# Patient Record
Sex: Female | Born: 1990 | Race: Black or African American | Hispanic: No | Smoking: Never smoker
Health system: Southern US, Community
[De-identification: ages and names within clinical notes are randomized; demographics above are authoritative.]

## PROBLEM LIST (undated history)

## (undated) ENCOUNTER — Inpatient Hospital Stay (HOSPITAL_COMMUNITY): Payer: Self-pay

## (undated) DIAGNOSIS — I10 Essential (primary) hypertension: Secondary | ICD-10-CM

## (undated) DIAGNOSIS — J45909 Unspecified asthma, uncomplicated: Secondary | ICD-10-CM

## (undated) DIAGNOSIS — E876 Hypokalemia: Secondary | ICD-10-CM

## (undated) DIAGNOSIS — F99 Mental disorder, not otherwise specified: Secondary | ICD-10-CM

## (undated) DIAGNOSIS — F3181 Bipolar II disorder: Secondary | ICD-10-CM

## (undated) DIAGNOSIS — E559 Vitamin D deficiency, unspecified: Secondary | ICD-10-CM

## (undated) HISTORY — DX: Vitamin D deficiency, unspecified: E55.9

## (undated) HISTORY — DX: Unspecified asthma, uncomplicated: J45.909

## (undated) HISTORY — PX: TONSILLECTOMY: SUR1361

## (undated) HISTORY — DX: Hypokalemia: E87.6

## (undated) HISTORY — DX: Mental disorder, not otherwise specified: F99

## (undated) HISTORY — DX: Bipolar II disorder: F31.81

---

## 2011-08-07 DIAGNOSIS — D649 Anemia, unspecified: Secondary | ICD-10-CM | POA: Insufficient documentation

## 2012-07-30 DIAGNOSIS — J45909 Unspecified asthma, uncomplicated: Secondary | ICD-10-CM | POA: Insufficient documentation

## 2013-02-12 ENCOUNTER — Emergency Department: Payer: Self-pay | Admitting: Internal Medicine

## 2013-02-12 LAB — URINALYSIS, COMPLETE
BLOOD: NEGATIVE
Bilirubin,UR: NEGATIVE
Glucose,UR: NEGATIVE mg/dL (ref 0–75)
Ketone: NEGATIVE
Leukocyte Esterase: NEGATIVE
Nitrite: NEGATIVE
PROTEIN: NEGATIVE
Ph: 6 (ref 4.5–8.0)
RBC,UR: 1 /HPF (ref 0–5)
Specific Gravity: 1.028 (ref 1.003–1.030)

## 2013-02-12 LAB — GC/CHLAMYDIA PROBE AMP

## 2013-02-12 LAB — WET PREP, GENITAL

## 2013-06-18 LAB — CYTOLOGY - PAP

## 2013-10-13 ENCOUNTER — Emergency Department: Payer: Self-pay | Admitting: Student

## 2013-10-13 LAB — URINALYSIS, COMPLETE
BLOOD: NEGATIVE
Bilirubin,UR: NEGATIVE
Glucose,UR: NEGATIVE mg/dL (ref 0–75)
KETONE: NEGATIVE
Nitrite: NEGATIVE
Ph: 7 (ref 4.5–8.0)
Protein: NEGATIVE
SPECIFIC GRAVITY: 1.024 (ref 1.003–1.030)
Squamous Epithelial: 11
WBC UR: 22 /HPF (ref 0–5)

## 2013-11-29 ENCOUNTER — Emergency Department: Payer: Self-pay | Admitting: Emergency Medicine

## 2013-11-29 LAB — CBC WITH DIFFERENTIAL/PLATELET
Basophil #: 0.1 10*3/uL (ref 0.0–0.1)
Basophil %: 0.7 %
EOS PCT: 0.4 %
Eosinophil #: 0 10*3/uL (ref 0.0–0.7)
HCT: 36.5 % (ref 35.0–47.0)
HGB: 11.9 g/dL — AB (ref 12.0–16.0)
Lymphocyte #: 2.9 10*3/uL (ref 1.0–3.6)
Lymphocyte %: 40.2 %
MCH: 28.1 pg (ref 26.0–34.0)
MCHC: 32.7 g/dL (ref 32.0–36.0)
MCV: 86 fL (ref 80–100)
Monocyte #: 0.6 x10 3/mm (ref 0.2–0.9)
Monocyte %: 8.3 %
NEUTROS PCT: 50.4 %
Neutrophil #: 3.7 10*3/uL (ref 1.4–6.5)
PLATELETS: 273 10*3/uL (ref 150–440)
RBC: 4.26 10*6/uL (ref 3.80–5.20)
RDW: 14 % (ref 11.5–14.5)
WBC: 7.3 10*3/uL (ref 3.6–11.0)

## 2013-11-29 LAB — COMPREHENSIVE METABOLIC PANEL
ALT: 18 U/L
Albumin: 3.9 g/dL (ref 3.4–5.0)
Alkaline Phosphatase: 89 U/L
Anion Gap: 10 (ref 7–16)
BUN: 8 mg/dL (ref 7–18)
Bilirubin,Total: 0.4 mg/dL (ref 0.2–1.0)
CALCIUM: 9 mg/dL (ref 8.5–10.1)
CHLORIDE: 106 mmol/L (ref 98–107)
Co2: 25 mmol/L (ref 21–32)
Creatinine: 0.59 mg/dL — ABNORMAL LOW (ref 0.60–1.30)
EGFR (Non-African Amer.): >60
Glucose: 71 mg/dL (ref 65–99)
OSMOLALITY: 278 (ref 275–301)
POTASSIUM: 4.1 mmol/L (ref 3.5–5.1)
SGOT(AST): 25 U/L (ref 15–37)
SODIUM: 141 mmol/L (ref 136–145)
TOTAL PROTEIN: 7.5 g/dL (ref 6.4–8.2)

## 2013-11-29 LAB — URINALYSIS, COMPLETE
BILIRUBIN, UR: NEGATIVE
Blood: NEGATIVE
GLUCOSE, UR: NEGATIVE mg/dL (ref 0–75)
Nitrite: NEGATIVE
PH: 5 (ref 4.5–8.0)
Protein: 30
RBC,UR: 10 /HPF (ref 0–5)
SPECIFIC GRAVITY: 1.02 (ref 1.003–1.030)
Squamous Epithelial: 100
WBC UR: 17 /HPF (ref 0–5)

## 2013-11-29 LAB — HCG, QUANTITATIVE, PREGNANCY: Beta Hcg, Quant.: 1153 m[IU]/mL — ABNORMAL HIGH

## 2013-11-29 LAB — LIPASE, BLOOD: LIPASE: 105 U/L (ref 73–393)

## 2013-12-22 LAB — OB RESULTS CONSOLE GC/CHLAMYDIA
CHLAMYDIA, DNA PROBE: NEGATIVE
GC PROBE AMP, GENITAL: NEGATIVE

## 2013-12-22 LAB — OB RESULTS CONSOLE ANTIBODY SCREEN: ANTIBODY SCREEN: NEGATIVE

## 2013-12-22 LAB — OB RESULTS CONSOLE ABO/RH: RH TYPE: POSITIVE

## 2013-12-22 LAB — OB RESULTS CONSOLE HGB/HCT, BLOOD
HCT: 36 %
HEMOGLOBIN: 12.1 g/dL

## 2013-12-22 LAB — OB RESULTS CONSOLE RUBELLA ANTIBODY, IGM: RUBELLA: IMMUNE

## 2013-12-22 LAB — OB RESULTS CONSOLE RPR: RPR: NONREACTIVE

## 2013-12-22 LAB — OB RESULTS CONSOLE VARICELLA ZOSTER ANTIBODY, IGG: Varicella: IMMUNE

## 2013-12-22 LAB — OB RESULTS CONSOLE HEPATITIS B SURFACE ANTIGEN: HEP B S AG: NEGATIVE

## 2013-12-22 LAB — OB RESULTS CONSOLE HIV ANTIBODY (ROUTINE TESTING): HIV: NONREACTIVE

## 2013-12-22 LAB — OB RESULTS CONSOLE PLATELET COUNT: PLATELETS: 344 10*3/uL

## 2014-02-06 NOTE — L&D Delivery Note (Signed)
Patient is 24 y.o. [id number] [redacted]w[redacted]d admitted for SOL.  Delivery Note At 11:41 AM a viable female was delivered via Vaginal, Spontaneous Delivery (Presentation: Left Occiput Anterior).  APGAR: 9, 9; weight 7 lb 6.7 oz (3365 g). Placenta status: Intact, Spontaneous.  Cord: 3 vessels with the following complications: None.  Cord pH: none.  Anesthesia: None  Episiotomy: None Lacerations: None Est. Blood Loss (mL): 130  Mom to postpartum.  Baby to Couplet care / Skin to Skin.   Caryl Ada, DO 07/29/2014, 12:01 PM PGY-1, [facility]

## 2014-03-16 ENCOUNTER — Emergency Department: Payer: Self-pay | Admitting: Emergency Medicine

## 2014-03-16 LAB — URINALYSIS, COMPLETE
BILIRUBIN, UR: NEGATIVE
Bacteria: NONE SEEN
Blood: NEGATIVE
Glucose,UR: NEGATIVE mg/dL (ref 0–75)
KETONE: NEGATIVE
LEUKOCYTE ESTERASE: NEGATIVE
Nitrite: NEGATIVE
Ph: 8 (ref 4.5–8.0)
Protein: NEGATIVE
RBC,UR: 1 /HPF (ref 0–5)
Specific Gravity: 1.015 (ref 1.003–1.030)
Squamous Epithelial: 5
WBC UR: 3 /HPF (ref 0–5)

## 2014-03-16 LAB — CBC WITH DIFFERENTIAL/PLATELET
Basophil #: 0 10*3/uL (ref 0.0–0.1)
Basophil %: 0.6 %
Eosinophil #: 0 10*3/uL (ref 0.0–0.7)
Eosinophil %: 0.5 %
HCT: 33 % — ABNORMAL LOW (ref 35.0–47.0)
HGB: 10.9 g/dL — AB (ref 12.0–16.0)
Lymphocyte #: 2 10*3/uL (ref 1.0–3.6)
Lymphocyte %: 30.1 %
MCH: 28.4 pg (ref 26.0–34.0)
MCHC: 33 g/dL (ref 32.0–36.0)
MCV: 86 fL (ref 80–100)
MONO ABS: 0.6 x10 3/mm (ref 0.2–0.9)
Monocyte %: 8.5 %
NEUTROS PCT: 60.3 %
Neutrophil #: 4 10*3/uL (ref 1.4–6.5)
PLATELETS: 262 10*3/uL (ref 150–440)
RBC: 3.84 10*6/uL (ref 3.80–5.20)
RDW: 13.6 % (ref 11.5–14.5)
WBC: 6.6 10*3/uL (ref 3.6–11.0)

## 2014-03-16 LAB — COMPREHENSIVE METABOLIC PANEL
ALK PHOS: 103 U/L (ref 46–116)
Albumin: 2.7 g/dL — ABNORMAL LOW (ref 3.4–5.0)
Anion Gap: 9 (ref 7–16)
BILIRUBIN TOTAL: 0.2 mg/dL (ref 0.2–1.0)
BUN: 7 mg/dL (ref 7–18)
CHLORIDE: 104 mmol/L (ref 98–107)
CO2: 23 mmol/L (ref 21–32)
CREATININE: 0.54 mg/dL — AB (ref 0.60–1.30)
Calcium, Total: 8.9 mg/dL (ref 8.5–10.1)
EGFR (African American): >60
Glucose: 85 mg/dL (ref 65–99)
OSMOLALITY: 269 (ref 275–301)
Potassium: 3.9 mmol/L (ref 3.5–5.1)
SGOT(AST): 9 U/L — ABNORMAL LOW (ref 15–37)
SGPT (ALT): 14 U/L (ref 14–63)
Sodium: 136 mmol/L (ref 136–145)
Total Protein: 6.9 g/dL (ref 6.4–8.2)

## 2014-03-16 LAB — LIPASE, BLOOD: LIPASE: 96 U/L (ref 73–393)

## 2014-03-16 LAB — HCG, QUANTITATIVE, PREGNANCY: Beta Hcg, Quant.: 34575 m[IU]/mL — ABNORMAL HIGH

## 2014-04-08 ENCOUNTER — Encounter: Payer: Self-pay | Admitting: *Deleted

## 2014-04-29 ENCOUNTER — Encounter: Payer: Self-pay | Admitting: Physician Assistant

## 2014-04-29 ENCOUNTER — Ambulatory Visit (INDEPENDENT_AMBULATORY_CARE_PROVIDER_SITE_OTHER): Payer: BLUE CROSS/BLUE SHIELD | Admitting: Physician Assistant

## 2014-04-29 VITALS — BP 108/72 | HR 85 | Ht 61.0 in | Wt 159.1 lb

## 2014-04-29 DIAGNOSIS — O9989 Other specified diseases and conditions complicating pregnancy, childbirth and the puerperium: Secondary | ICD-10-CM

## 2014-04-29 DIAGNOSIS — J45909 Unspecified asthma, uncomplicated: Secondary | ICD-10-CM | POA: Insufficient documentation

## 2014-04-29 DIAGNOSIS — O99519 Diseases of the respiratory system complicating pregnancy, unspecified trimester: Secondary | ICD-10-CM

## 2014-04-29 DIAGNOSIS — Z3482 Encounter for supervision of other normal pregnancy, second trimester: Secondary | ICD-10-CM

## 2014-04-29 DIAGNOSIS — Z3492 Encounter for supervision of normal pregnancy, unspecified, second trimester: Secondary | ICD-10-CM

## 2014-04-29 DIAGNOSIS — Z3493 Encounter for supervision of normal pregnancy, unspecified, third trimester: Secondary | ICD-10-CM | POA: Insufficient documentation

## 2014-04-29 LAB — POCT URINALYSIS DIP (DEVICE)
Bilirubin Urine: NEGATIVE
Glucose, UA: NEGATIVE mg/dL
Hgb urine dipstick: NEGATIVE
KETONES UR: NEGATIVE mg/dL
LEUKOCYTES UA: NEGATIVE
Nitrite: NEGATIVE
Protein, ur: NEGATIVE mg/dL
Specific Gravity, Urine: 1.02 (ref 1.005–1.030)
Urobilinogen, UA: 1 mg/dL (ref 0.0–1.0)
pH: 7.5 (ref 5.0–8.0)

## 2014-04-29 LAB — CBC
HEMATOCRIT: 30.3 % — AB (ref 36.0–46.0)
HEMOGLOBIN: 10.2 g/dL — AB (ref 12.0–15.0)
MCH: 28.3 pg (ref 26.0–34.0)
MCHC: 33.7 g/dL (ref 30.0–36.0)
MCV: 84.2 fL (ref 78.0–100.0)
MPV: 10.1 fL (ref 8.6–12.4)
Platelets: 268 10*3/uL (ref 150–400)
RBC: 3.6 MIL/uL — ABNORMAL LOW (ref 3.87–5.11)
RDW: 13.2 % (ref 11.5–15.5)
WBC: 6.5 10*3/uL (ref 4.0–10.5)

## 2014-04-29 MED ORDER — TETANUS-DIPHTH-ACELL PERTUSSIS 5-2.5-18.5 LF-MCG/0.5 IM SUSP: 0.5000 mL | Freq: Once | INTRAMUSCULAR | Status: DC

## 2014-04-29 NOTE — Patient Instructions (Signed)
Second Trimester of Pregnancy The second trimester is from week 13 through week 28, months 4 through 6. The second trimester is often a time when you feel your best. Your body has also adjusted to being pregnant, and you begin to feel better physically. Usually, morning sickness has lessened or quit completely, you may have more energy, and you may have an increase in appetite. The second trimester is also a time when the fetus is growing rapidly. At the end of the sixth month, the fetus is about 9 inches long and weighs about 1 pounds. You will likely begin to feel the baby move (quickening) between 18 and 20 weeks of the pregnancy. BODY CHANGES Your body goes through many changes during pregnancy. The changes vary from woman to woman.   Your weight will continue to increase. You will notice your lower abdomen bulging out.  You may begin to get stretch marks on your hips, abdomen, and breasts.  You may develop headaches that can be relieved by medicines approved by your health care provider.  You may urinate more often because the fetus is pressing on your bladder.  You may develop or continue to have heartburn as a result of your pregnancy.  You may develop constipation because certain hormones are causing the muscles that push waste through your intestines to slow down.  You may develop hemorrhoids or swollen, bulging veins (varicose veins).  You may have back pain because of the weight gain and pregnancy hormones relaxing your joints between the bones in your pelvis and as a result of a shift in weight and the muscles that support your balance.  Your breasts will continue to grow and be tender.  Your gums may bleed and may be sensitive to brushing and flossing.  Dark spots or blotches (chloasma, mask of pregnancy) may develop on your face. This will likely fade after the baby is born.  A dark line from your belly button to the pubic area (linea nigra) may appear. This will likely fade  after the baby is born.  You may have changes in your hair. These can include thickening of your hair, rapid growth, and changes in texture. Some women also have hair loss during or after pregnancy, or hair that feels dry or thin. Your hair will most likely return to normal after your baby is born. WHAT TO EXPECT AT YOUR PRENATAL VISITS During a routine prenatal visit:  You will be weighed to make sure you and the fetus are growing normally.  Your blood pressure will be taken.  Your abdomen will be measured to track your baby's growth.  The fetal heartbeat will be listened to.  Any test results from the previous visit will be discussed. Your health care provider may ask you:  How you are feeling.  If you are feeling the baby move.  If you have had any abnormal symptoms, such as leaking fluid, bleeding, severe headaches, or abdominal cramping.  If you have any questions. Other tests that may be performed during your second trimester include:  Blood tests that check for:  Low iron levels (anemia).  Gestational diabetes (between 24 and 28 weeks).  Rh antibodies.  Urine tests to check for infections, diabetes, or protein in the urine.  An ultrasound to confirm the proper growth and development of the baby.  An amniocentesis to check for possible genetic problems.  Fetal screens for spina bifida and Down syndrome. HOME CARE INSTRUCTIONS   Avoid all smoking, herbs, alcohol, and unprescribed   drugs. These chemicals affect the formation and growth of the baby.  Follow your health care provider's instructions regarding medicine use. There are medicines that are either safe or unsafe to take during pregnancy.  Exercise only as directed by your health care provider. Experiencing uterine cramps is a good sign to stop exercising.  Continue to eat regular, healthy meals.  Wear a good support bra for breast tenderness.  Do not use hot tubs, steam rooms, or saunas.  Wear your  seat belt at all times when driving.  Avoid raw meat, uncooked cheese, cat litter boxes, and soil used by cats. These carry germs that can cause birth defects in the baby.  Take your prenatal vitamins.  Try taking a stool softener (if your health care provider approves) if you develop constipation. Eat more high-fiber foods, such as fresh vegetables or fruit and whole grains. Drink plenty of fluids to keep your urine clear or pale yellow.  Take warm sitz baths to soothe any pain or discomfort caused by hemorrhoids. Use hemorrhoid cream if your health care provider approves.  If you develop varicose veins, wear support hose. Elevate your feet for 15 minutes, 3-4 times a day. Limit salt in your diet.  Avoid heavy lifting, wear low heel shoes, and practice good posture.  Rest with your legs elevated if you have leg cramps or low back pain.  Visit your dentist if you have not gone yet during your pregnancy. Use a soft toothbrush to brush your teeth and be gentle when you floss.  A sexual relationship may be continued unless your health care provider directs you otherwise.  Continue to go to all your prenatal visits as directed by your health care provider. SEEK MEDICAL CARE IF:   You have dizziness.  You have mild pelvic cramps, pelvic pressure, or nagging pain in the abdominal area.  You have persistent nausea, vomiting, or diarrhea.  You have a bad smelling vaginal discharge.  You have pain with urination. SEEK IMMEDIATE MEDICAL CARE IF:   You have a fever.  You are leaking fluid from your vagina.  You have spotting or bleeding from your vagina.  You have severe abdominal cramping or pain.  You have rapid weight gain or loss.  You have shortness of breath with chest pain.  You notice sudden or extreme swelling of your face, hands, ankles, feet, or legs.  You have not felt your baby move in over an hour.  You have severe headaches that do not go away with  medicine.  You have vision changes. Document Released: 01/17/2001 Document Revised: 01/28/2013 Document Reviewed: 03/26/2012 ExitCare Patient Information 2015 [employer]. This information is not intended to replace advice given to you by your health care provider. Make sure you discuss any questions you have with your health care provider.  

## 2014-04-29 NOTE — Progress Notes (Signed)
  Subjective:    Carrie [name] is a G3P1011 7152w2d being seen today for her first obstetrical visit.  Her obstetrical history is significant [facility]   Patient does intend to breast feed. Pregnancy history fully reviewed.  Patient reports no complaints.  Filed Vitals:   04/29/14 0916 04/29/14 0918  BP: 108/72   Pulse: 85   Height:  5\' 1"  (1.549 m)  Weight: 159 lb 1.6 oz (72.167 kg)     HISTORY: OB History  Gravida Para Term Preterm AB SAB TAB Ectopic Multiple Living  3 1 1  1  1   1     # Outcome Date GA Lbr Len/2nd Weight Sex Delivery Anes PTL Lv  3 Current           2 TAB 02/07/11          1 Term 09/06/07 4448w0d  7 lb 11 oz (3.487 kg) M Vag-Spont  N Y     Past Medical History  Diagnosis Date  . Asthma    Past Surgical History  Procedure Laterality Date  . Tonsillectomy     History reviewed. No pertinent family history.   Exam    Uterus:   gravid  :                               System:     Skin: normal coloration and turgor, no rashes    Neurologic: oriented, normal mood, grossly non-focal   Extremities: normal strength, tone, and muscle mass   HEENT extra ocular movement intact   Mouth/Teeth mucous membranes moist, pharynx normal without lesions and dental hygiene good   Neck supple and no masses   Cardiovascular: regular rate and rhythm   Respiratory:  appears well, vitals normal, no respiratory distress, acyanotic, normal RR, ear and throat exam is normal, neck free of mass or lymphadenopathy, chest clear, no wheezing, crepitations, rhonchi, normal symmetric air entry   Abdomen: soft, non-tender; bowel sounds normal; no masses,  no organomegaly          Assessment:    Pregnancy: [id number] Patient Active Problem List   Diagnosis Date Noted  . Encounter for supervision of other normal pregnancy in second trimester 04/29/2014  . Asthma affecting pregnancy, antepartum 04/29/2014        Plan:     Initial labs  drawn. Prenatal vitamins. Problem list reviewed and updated. Genetic Screening discussed Quad Screen: ordered.  Ultrasound discussed; fetal survey: requested.  Follow up in 2 weeks. 90% of 45 min visit spent on counseling and coordination of care.  Pt has albuterol inhaler already.   Carrie [name], Karen E 04/29/2014

## 2014-04-30 LAB — PRESCRIPTION MONITORING PROFILE (19 PANEL)
Amphetamine/Meth: NEGATIVE ng/mL
BENZODIAZEPINE SCREEN, URINE: NEGATIVE ng/mL
BUPRENORPHINE, URINE: NEGATIVE ng/mL
Barbiturate Screen, Urine: NEGATIVE ng/mL
CANNABINOID SCRN UR: NEGATIVE ng/mL
COCAINE METABOLITES: NEGATIVE ng/mL
Carisoprodol, Urine: NEGATIVE ng/mL
Creatinine, Urine: 104.93 mg/dL (ref >20.0)
FENTANYL URINE: NEGATIVE ng/mL
MDMA URINE: NEGATIVE ng/mL
METHAQUALONE SCREEN (URINE): NEGATIVE ng/mL
Meperidine, Ur: NEGATIVE ng/mL
Methadone Screen, Urine: NEGATIVE ng/mL
Nitrites, Initial: NEGATIVE ug/mL
OPIATE SCREEN, URINE: NEGATIVE ng/mL
Oxycodone Screen, Ur: NEGATIVE ng/mL
PHENCYCLIDINE, UR: NEGATIVE ng/mL
Propoxyphene: NEGATIVE ng/mL
Tapentadol, urine: NEGATIVE ng/mL
Tramadol Scrn, Ur: NEGATIVE ng/mL
Zolpidem, Urine: NEGATIVE ng/mL
pH, Initial: 7.6 pH (ref 4.5–8.9)

## 2014-04-30 LAB — CULTURE, OB URINE
COLONY COUNT: NO GROWTH
Organism ID, Bacteria: NO GROWTH

## 2014-04-30 LAB — HIV ANTIBODY (ROUTINE TESTING W REFLEX): HIV: NONREACTIVE

## 2014-04-30 LAB — GLUCOSE TOLERANCE, 1 HOUR (50G) W/O FASTING: GLUCOSE 1 HOUR GTT: 94 mg/dL (ref 70–140)

## 2014-04-30 LAB — RPR

## 2014-05-11 ENCOUNTER — Other Ambulatory Visit: Payer: Self-pay | Admitting: Physician Assistant

## 2014-05-11 ENCOUNTER — Ambulatory Visit (HOSPITAL_COMMUNITY)
Admission: RE | Admit: 2014-05-11 | Discharge: 2014-05-11 | Disposition: A | Discharge status: Home / Self Care | Payer: BLUE CROSS/BLUE SHIELD | Source: Ambulatory Visit | Attending: Physician Assistant | Admitting: Physician Assistant

## 2014-05-11 ENCOUNTER — Ambulatory Visit (INDEPENDENT_AMBULATORY_CARE_PROVIDER_SITE_OTHER): Payer: BLUE CROSS/BLUE SHIELD | Admitting: Family Medicine

## 2014-05-11 VITALS — BP 103/61 | HR 84 | Temp 98.0°F | Wt 160.2 lb

## 2014-05-11 DIAGNOSIS — O409XX0 Polyhydramnios, unspecified trimester, not applicable or unspecified: Secondary | ICD-10-CM | POA: Insufficient documentation

## 2014-05-11 DIAGNOSIS — Z3A28 28 weeks gestation of pregnancy: Secondary | ICD-10-CM | POA: Diagnosis not present

## 2014-05-11 DIAGNOSIS — Z3492 Encounter for supervision of normal pregnancy, unspecified, second trimester: Secondary | ICD-10-CM

## 2014-05-11 DIAGNOSIS — O403XX0 Polyhydramnios, third trimester, not applicable or unspecified: Secondary | ICD-10-CM | POA: Insufficient documentation

## 2014-05-11 DIAGNOSIS — Z3482 Encounter for supervision of other normal pregnancy, second trimester: Secondary | ICD-10-CM

## 2014-05-11 DIAGNOSIS — Z3689 Encounter for other specified antenatal screening: Secondary | ICD-10-CM | POA: Insufficient documentation

## 2014-05-11 DIAGNOSIS — Z36 Encounter for antenatal screening of mother: Secondary | ICD-10-CM | POA: Diagnosis not present

## 2014-05-11 NOTE — Patient Instructions (Signed)
Third Trimester of Pregnancy The third trimester is from week 29 through week 42, months 7 through 9. The third trimester is a time when the fetus is growing rapidly. At the end of the ninth month, the fetus is about 20 inches in length and weighs 6-10 pounds.  BODY CHANGES Your body goes through many changes during pregnancy. The changes vary from woman to woman.   Your weight will continue to increase. You can expect to gain 25-35 pounds (11-16 kg) by the end of the pregnancy.  You may begin to get stretch marks on your hips, abdomen, and breasts.  You may urinate more often because the fetus is moving lower into your pelvis and pressing on your bladder.  You may develop or continue to have heartburn as a result of your pregnancy.  You may develop constipation because certain hormones are causing the muscles that push waste through your intestines to slow down.  You may develop hemorrhoids or swollen, bulging veins (varicose veins).  You may have pelvic pain because of the weight gain and pregnancy hormones relaxing your joints between the bones in your pelvis. Backaches may result from overexertion of the muscles supporting your posture.  You may have changes in your hair. These can include thickening of your hair, rapid growth, and changes in texture. Some women also have hair loss during or after pregnancy, or hair that feels dry or thin. Your hair will most likely return to normal after your baby is born.  Your breasts will continue to grow and be tender. A yellow discharge may leak from your breasts called colostrum.  Your belly button may stick out.  You may feel short of breath because of your expanding uterus.  You may notice the fetus "dropping," or moving lower in your abdomen.  You may have a bloody mucus discharge. This usually occurs a few days to a week before labor begins.  Your cervix becomes thin and soft (effaced) near your due date. WHAT TO EXPECT AT YOUR PRENATAL  EXAMS  You will have prenatal exams every 2 weeks until week 36. Then, you will have weekly prenatal exams. During a routine prenatal visit:  You will be weighed to make sure you and the fetus are growing normally.  Your blood pressure is taken.  Your abdomen will be measured to track your baby's growth.  The fetal heartbeat will be listened to.  Any test results from the previous visit will be discussed.  You may have a cervical check near your due date to see if you have effaced. At around 36 weeks, your caregiver will check your cervix. At the same time, your caregiver will also perform a test on the secretions of the vaginal tissue. This test is to determine if a type of bacteria, Group B streptococcus, is present. Your caregiver will explain this further. Your caregiver may ask you:  What your birth plan is.  How you are feeling.  If you are feeling the baby move.  If you have had any abnormal symptoms, such as leaking fluid, bleeding, severe headaches, or abdominal cramping.  If you have any questions. Other tests or screenings that may be performed during your third trimester include:  Blood tests that check for low iron levels (anemia).  Fetal testing to check the health, activity level, and growth of the fetus. Testing is done if you have certain medical conditions or if there are problems during the pregnancy. FALSE LABOR You may feel small, irregular contractions that   eventually go away. These are called Braxton Hicks contractions, or false labor. Contractions may last for hours, days, or even weeks before true labor sets in. If contractions come at regular intervals, intensify, or become painful, it is best to be seen by your caregiver.  SIGNS OF LABOR   Menstrual-like cramps.  Contractions that are 5 minutes apart or less.  Contractions that start on the top of the uterus and spread down to the lower abdomen and back.  A sense of increased pelvic pressure or back  pain.  A watery or bloody mucus discharge that comes from the vagina. If you have any of these signs before the 37th week of pregnancy, call your caregiver right away. You need to go to the hospital to get checked immediately. HOME CARE INSTRUCTIONS   Avoid all smoking, herbs, alcohol, and unprescribed drugs. These chemicals affect the formation and growth of the baby.  Follow your caregiver's instructions regarding medicine use. There are medicines that are either safe or unsafe to take during pregnancy.  Exercise only as directed by your caregiver. Experiencing uterine cramps is a good sign to stop exercising.  Continue to eat regular, healthy meals.  Wear a good support bra for breast tenderness.  Do not use hot tubs, steam rooms, or saunas.  Wear your seat belt at all times when driving.  Avoid raw meat, uncooked cheese, cat litter boxes, and soil used by cats. These carry germs that can cause birth defects in the baby.  Take your prenatal vitamins.  Try taking a stool softener (if your caregiver approves) if you develop constipation. Eat more high-fiber foods, such as fresh vegetables or fruit and whole grains. Drink plenty of fluids to keep your urine clear or pale yellow.  Take warm sitz baths to soothe any pain or discomfort caused by hemorrhoids. Use hemorrhoid cream if your caregiver approves.  If you develop varicose veins, wear support hose. Elevate your feet for 15 minutes, 3-4 times a day. Limit salt in your diet.  Avoid heavy lifting, wear low heal shoes, and practice good posture.  Rest a lot with your legs elevated if you have leg cramps or low back pain.  Visit your dentist if you have not gone during your pregnancy. Use a soft toothbrush to brush your teeth and be gentle when you floss.  A sexual relationship may be continued unless your caregiver directs you otherwise.  Do not travel far distances unless it is absolutely necessary and only with the approval  of your caregiver.  Take prenatal classes to understand, practice, and ask questions about the labor and delivery.  Make a trial run to the hospital.  Pack your hospital bag.  Prepare the baby's nursery.  Continue to go to all your prenatal visits as directed by your caregiver. SEEK MEDICAL CARE IF:  You are unsure if you are in labor or if your water has broken.  You have dizziness.  You have mild pelvic cramps, pelvic pressure, or nagging pain in your abdominal area.  You have persistent nausea, vomiting, or diarrhea.  You have a bad smelling vaginal discharge.  You have pain with urination. SEEK IMMEDIATE MEDICAL CARE IF:   You have a fever.  You are leaking fluid from your vagina.  You have spotting or bleeding from your vagina.  You have severe abdominal cramping or pain.  You have rapid weight loss or gain.  You have shortness of breath with chest pain.  You notice sudden or extreme swelling   of your face, hands, ankles, feet, or legs.  You have not felt your baby move in over an hour.  You have severe headaches that do not go away with medicine.  You have vision changes. Document Released: 01/17/2001 Document Revised: 01/28/2013 Document Reviewed: 03/26/2012 Exit[employer] Patient Information 2015 [employer]. This information is not intended to replace advice given to you by your health care provider. Make sure you discuss any questions you have with your health care provider.  

## 2014-05-11 NOTE — Progress Notes (Signed)
Doing well Reports excellent FM TDaP has been given 28 wk labs WNL

## 2014-05-11 NOTE — Progress Notes (Signed)
States unable to take prenatal vitamins because of size-needs prescription for chewables. C/o lower back pain. C/o watery discharge that she has been having all of pregnancy, not changed.

## 2014-05-25 ENCOUNTER — Telehealth: Payer: Self-pay | Admitting: *Deleted

## 2014-05-25 DIAGNOSIS — O403XX1 Polyhydramnios, third trimester, fetus 1: Secondary | ICD-10-CM

## 2014-05-25 NOTE — Telephone Encounter (Signed)
BPP scheduled 1) 05/28/14 @ 3:45pm   2)  4/28 @ 0918 after OB appt.   Attempted to contact patient, no answer, left message for patient to contact the clinic for appointments.  Pt needs to made aware of diagnosis of polyhydramnios and scheduled BPP's.

## 2014-05-25 NOTE — Telephone Encounter (Signed)
Patient called and left message stating she is returning our call. Called patient and informed her of results and recommendations. Informed patient of appts. Patient verbalized understanding to all and asked why this might have happened. Told patient there isn't a particular reason we can tell her why but sometimes this does happen in otherwise normal pregnancies. Patient verbalized understanding and had no other questions

## 2014-05-25 NOTE — Telephone Encounter (Signed)
-----   Message from Bertram DenverKaren E Teague Clark, PA-C sent at 05/22/2014  6:47 PM EDT ----- Per u/s on 4/4 - pt has polyhydramnios.  Will need 1 hour gtt if not already performed.  SHould have weekly BPP until 32 weeks, then twice weekly testing.   Needs u/s for growth 4 weeks after this u/s.

## 2014-05-28 ENCOUNTER — Ambulatory Visit (HOSPITAL_COMMUNITY)
Admission: RE | Admit: 2014-05-28 | Discharge: 2014-05-28 | Disposition: A | Discharge status: Home / Self Care | Payer: BLUE CROSS/BLUE SHIELD | Source: Ambulatory Visit | Attending: Physician Assistant | Admitting: Physician Assistant

## 2014-05-28 DIAGNOSIS — Z3A30 30 weeks gestation of pregnancy: Secondary | ICD-10-CM | POA: Insufficient documentation

## 2014-05-28 DIAGNOSIS — O403XX0 Polyhydramnios, third trimester, not applicable or unspecified: Secondary | ICD-10-CM | POA: Insufficient documentation

## 2014-05-28 DIAGNOSIS — O403XX1 Polyhydramnios, third trimester, fetus 1: Secondary | ICD-10-CM

## 2014-06-04 ENCOUNTER — Encounter: Payer: Self-pay | Admitting: Obstetrics and Gynecology

## 2014-06-04 ENCOUNTER — Ambulatory Visit (INDEPENDENT_AMBULATORY_CARE_PROVIDER_SITE_OTHER): Payer: BLUE CROSS/BLUE SHIELD | Admitting: Obstetrics and Gynecology

## 2014-06-04 ENCOUNTER — Ambulatory Visit (HOSPITAL_COMMUNITY)
Admission: RE | Admit: 2014-06-04 | Discharge: 2014-06-04 | Disposition: A | Discharge status: Home / Self Care | Payer: BLUE CROSS/BLUE SHIELD | Source: Ambulatory Visit | Attending: Physician Assistant | Admitting: Physician Assistant

## 2014-06-04 VITALS — BP 119/71 | HR 94 | Wt 163.1 lb

## 2014-06-04 DIAGNOSIS — Z3482 Encounter for supervision of other normal pregnancy, second trimester: Secondary | ICD-10-CM

## 2014-06-04 DIAGNOSIS — J45909 Unspecified asthma, uncomplicated: Secondary | ICD-10-CM

## 2014-06-04 DIAGNOSIS — Z3A31 31 weeks gestation of pregnancy: Secondary | ICD-10-CM | POA: Insufficient documentation

## 2014-06-04 DIAGNOSIS — O403XX0 Polyhydramnios, third trimester, not applicable or unspecified: Secondary | ICD-10-CM | POA: Diagnosis not present

## 2014-06-04 DIAGNOSIS — O99519 Diseases of the respiratory system complicating pregnancy, unspecified trimester: Secondary | ICD-10-CM

## 2014-06-04 DIAGNOSIS — O9989 Other specified diseases and conditions complicating pregnancy, childbirth and the puerperium: Secondary | ICD-10-CM

## 2014-06-04 DIAGNOSIS — O403XX1 Polyhydramnios, third trimester, fetus 1: Secondary | ICD-10-CM

## 2014-06-04 LAB — POCT URINALYSIS DIP (DEVICE)
Bilirubin Urine: NEGATIVE
Glucose, UA: NEGATIVE mg/dL
Hgb urine dipstick: NEGATIVE
KETONES UR: NEGATIVE mg/dL
NITRITE: NEGATIVE
PROTEIN: NEGATIVE mg/dL
Specific Gravity, Urine: 1.02 (ref 1.005–1.030)
Urobilinogen, UA: 4 mg/dL — ABNORMAL HIGH (ref 0.0–1.0)
pH: 7 (ref 5.0–8.0)

## 2014-06-04 NOTE — Progress Notes (Signed)
Small leuks on Udip 

## 2014-06-04 NOTE — Progress Notes (Signed)
Patient is doing well without any significant complaints. She reports the presence of a thin discharge non pruritic and without odor. Wet prep collected F/U ultrasound scheduled for today

## 2014-06-04 NOTE — Progress Notes (Signed)
Pt reports thin discharge that is watery at times.

## 2014-06-05 ENCOUNTER — Telehealth: Payer: Self-pay | Admitting: General Practice

## 2014-06-05 LAB — WET PREP, GENITAL
Trich, Wet Prep: NONE SEEN
YEAST WET PREP: NONE SEEN

## 2014-06-05 MED ORDER — METRONIDAZOLE 500 MG PO TABS: 500.0000 mg | ORAL_TABLET | Freq: Two times a day (BID) | ORAL | Status: DC

## 2014-06-05 NOTE — Telephone Encounter (Signed)
-----   Message from Catalina AntiguaPeggy Constant, MD sent at 06/05/2014  8:42 AM EDT ----- Please inform patient of positive BV. Flagyl e-prescribed  Forgot to tag patient name before  Thanks  Carrie [name]

## 2014-06-05 NOTE — Telephone Encounter (Signed)
Patient called and left message stating her doctor called a Rx in for her but she doesn't know why. Called patient and informed her of BV and medication at pharmacy. Patient verbalized understanding and asked about starting testing for NST. Told patient I will transfer her to our nurse that usually does the NSTs. Patient verbalized understanding and had no other questions. Called transferred to Diane

## 2014-06-05 NOTE — Addendum Note (Signed)
Addended by: Catalina AntiguaONSTANT, Kayl Stogdill on: 06/05/2014 08:42 AM   Modules accepted: Orders

## 2014-06-11 ENCOUNTER — Telehealth: Payer: Self-pay

## 2014-06-11 ENCOUNTER — Ambulatory Visit (INDEPENDENT_AMBULATORY_CARE_PROVIDER_SITE_OTHER): Payer: BLUE CROSS/BLUE SHIELD | Admitting: Obstetrics & Gynecology

## 2014-06-11 VITALS — BP 113/74 | HR 84 | Temp 98.0°F | Wt 162.4 lb

## 2014-06-11 DIAGNOSIS — O403XX0 Polyhydramnios, third trimester, not applicable or unspecified: Secondary | ICD-10-CM | POA: Diagnosis not present

## 2014-06-11 DIAGNOSIS — O403XX1 Polyhydramnios, third trimester, fetus 1: Secondary | ICD-10-CM

## 2014-06-11 DIAGNOSIS — Z3482 Encounter for supervision of other normal pregnancy, second trimester: Secondary | ICD-10-CM

## 2014-06-11 LAB — US OB LIMITED

## 2014-06-11 LAB — POCT URINALYSIS DIP (DEVICE)
Bilirubin Urine: NEGATIVE
GLUCOSE, UA: NEGATIVE mg/dL
HGB URINE DIPSTICK: NEGATIVE
KETONES UR: NEGATIVE mg/dL
Nitrite: NEGATIVE
Protein, ur: NEGATIVE mg/dL
SPECIFIC GRAVITY, URINE: 1.02 (ref 1.005–1.030)
Urobilinogen, UA: 1 mg/dL (ref 0.0–1.0)
pH: 7 (ref 5.0–8.0)

## 2014-06-11 NOTE — Patient Instructions (Signed)
Third Trimester of Pregnancy The third trimester is from week 29 through week 42, months 7 through 9. The third trimester is a time when the fetus is growing rapidly. At the end of the ninth month, the fetus is about 20 inches in length and weighs 6-10 pounds.  BODY CHANGES Your body goes through many changes during pregnancy. The changes vary from woman to woman.   Your weight will continue to increase. You can expect to gain 25-35 pounds (11-16 kg) by the end of the pregnancy.  You may begin to get stretch marks on your hips, abdomen, and breasts.  You may urinate more often because the fetus is moving lower into your pelvis and pressing on your bladder.  You may develop or continue to have heartburn as a result of your pregnancy.  You may develop constipation because certain hormones are causing the muscles that push waste through your intestines to slow down.  You may develop hemorrhoids or swollen, bulging veins (varicose veins).  You may have pelvic pain because of the weight gain and pregnancy hormones relaxing your joints between the bones in your pelvis. Backaches may result from overexertion of the muscles supporting your posture.  You may have changes in your hair. These can include thickening of your hair, rapid growth, and changes in texture. Some women also have hair loss during or after pregnancy, or hair that feels dry or thin. Your hair will most likely return to normal after your baby is born.  Your breasts will continue to grow and be tender. A yellow discharge may leak from your breasts called colostrum.  Your belly button may stick out.  You may feel short of breath because of your expanding uterus.  You may notice the fetus "dropping," or moving lower in your abdomen.  You may have a bloody mucus discharge. This usually occurs a few days to a week before labor begins.  Your cervix becomes thin and soft (effaced) near your due date. WHAT TO EXPECT AT YOUR PRENATAL  EXAMS  You will have prenatal exams every 2 weeks until week 36. Then, you will have weekly prenatal exams. During a routine prenatal visit:  You will be weighed to make sure you and the fetus are growing normally.  Your blood pressure is taken.  Your abdomen will be measured to track your baby's growth.  The fetal heartbeat will be listened to.  Any test results from the previous visit will be discussed.  You may have a cervical check near your due date to see if you have effaced. At around 36 weeks, your caregiver will check your cervix. At the same time, your caregiver will also perform a test on the secretions of the vaginal tissue. This test is to determine if a type of bacteria, Group B streptococcus, is present. Your caregiver will explain this further. Your caregiver may ask you:  What your birth plan is.  How you are feeling.  If you are feeling the baby move.  If you have had any abnormal symptoms, such as leaking fluid, bleeding, severe headaches, or abdominal cramping.  If you have any questions. Other tests or screenings that may be performed during your third trimester include:  Blood tests that check for low iron levels (anemia).  Fetal testing to check the health, activity level, and growth of the fetus. Testing is done if you have certain medical conditions or if there are problems during the pregnancy. FALSE LABOR You may feel small, irregular contractions that   eventually go away. These are called Braxton Hicks contractions, or false labor. Contractions may last for hours, days, or even weeks before true labor sets in. If contractions come at regular intervals, intensify, or become painful, it is best to be seen by your caregiver.  SIGNS OF LABOR   Menstrual-like cramps.  Contractions that are 5 minutes apart or less.  Contractions that start on the top of the uterus and spread down to the lower abdomen and back.  A sense of increased pelvic pressure or back  pain.  A watery or bloody mucus discharge that comes from the vagina. If you have any of these signs before the 37th week of pregnancy, call your caregiver right away. You need to go to the hospital to get checked immediately. HOME CARE INSTRUCTIONS   Avoid all smoking, herbs, alcohol, and unprescribed drugs. These chemicals affect the formation and growth of the baby.  Follow your caregiver's instructions regarding medicine use. There are medicines that are either safe or unsafe to take during pregnancy.  Exercise only as directed by your caregiver. Experiencing uterine cramps is a good sign to stop exercising.  Continue to eat regular, healthy meals.  Wear a good support bra for breast tenderness.  Do not use hot tubs, steam rooms, or saunas.  Wear your seat belt at all times when driving.  Avoid raw meat, uncooked cheese, cat litter boxes, and soil used by cats. These carry germs that can cause birth defects in the baby.  Take your prenatal vitamins.  Try taking a stool softener (if your caregiver approves) if you develop constipation. Eat more high-fiber foods, such as fresh vegetables or fruit and whole grains. Drink plenty of fluids to keep your urine clear or pale yellow.  Take warm sitz baths to soothe any pain or discomfort caused by hemorrhoids. Use hemorrhoid cream if your caregiver approves.  If you develop varicose veins, wear support hose. Elevate your feet for 15 minutes, 3-4 times a day. Limit salt in your diet.  Avoid heavy lifting, wear low heal shoes, and practice good posture.  Rest a lot with your legs elevated if you have leg cramps or low back pain.  Visit your dentist if you have not gone during your pregnancy. Use a soft toothbrush to brush your teeth and be gentle when you floss.  A sexual relationship may be continued unless your caregiver directs you otherwise.  Do not travel far distances unless it is absolutely necessary and only with the approval  of your caregiver.  Take prenatal classes to understand, practice, and ask questions about the labor and delivery.  Make a trial run to the hospital.  Pack your hospital bag.  Prepare the baby's nursery.  Continue to go to all your prenatal visits as directed by your caregiver. SEEK MEDICAL CARE IF:  You are unsure if you are in labor or if your water has broken.  You have dizziness.  You have mild pelvic cramps, pelvic pressure, or nagging pain in your abdominal area.  You have persistent nausea, vomiting, or diarrhea.  You have a bad smelling vaginal discharge.  You have pain with urination. SEEK IMMEDIATE MEDICAL CARE IF:   You have a fever.  You are leaking fluid from your vagina.  You have spotting or bleeding from your vagina.  You have severe abdominal cramping or pain.  You have rapid weight loss or gain.  You have shortness of breath with chest pain.  You notice sudden or extreme swelling   of your face, hands, ankles, feet, or legs.  You have not felt your baby move in over an hour.  You have severe headaches that do not go away with medicine.  You have vision changes. Document Released: 01/17/2001 Document Revised: 01/28/2013 Document Reviewed: 03/26/2012 Exit[employer] Patient Information 2015 [employer]. This information is not intended to replace advice given to you by your health care provider. Make sure you discuss any questions you have with your health care provider.  

## 2014-06-11 NOTE — Progress Notes (Signed)
NST reactive today, AFI 17. Pressure and trouble with mild SOB

## 2014-06-11 NOTE — Progress Notes (Signed)
States she has one pill of flagyl left-- reports she ALWAYS gets yeast infection-- requests Diflucan.  C/o pelvic pressure.

## 2014-06-11 NOTE — Telephone Encounter (Signed)
FMLA completed and [facility] at [phone]. Copy made and placed in bin to give to patient at next visit. Original placed in to be scanned folder for front office staff to scan in. Called patient and informed her paperwork completed and faxed.

## 2014-06-11 NOTE — Progress Notes (Signed)
Leukocytes: Trace 

## 2014-06-15 ENCOUNTER — Ambulatory Visit (INDEPENDENT_AMBULATORY_CARE_PROVIDER_SITE_OTHER): Payer: BLUE CROSS/BLUE SHIELD | Admitting: *Deleted

## 2014-06-15 DIAGNOSIS — O403XX1 Polyhydramnios, third trimester, fetus 1: Secondary | ICD-10-CM | POA: Diagnosis not present

## 2014-06-15 MED ORDER — FLUCONAZOLE 150 MG PO TABS: 150.0000 mg | ORAL_TABLET | Freq: Once | ORAL | Status: DC

## 2014-06-15 NOTE — Progress Notes (Signed)
06/15/2014 NST reviewed and reactive

## 2014-06-15 NOTE — Progress Notes (Signed)
Pt states she has developed a vaginal yeast infection after taking Flagyl for BV. She reports severe itching and irritation. Diflucan ordered.  Pt also advised that she may use hydrocortisone to external genitalia.

## 2014-06-17 ENCOUNTER — Other Ambulatory Visit: Payer: Self-pay | Admitting: Obstetrics and Gynecology

## 2014-06-19 ENCOUNTER — Inpatient Hospital Stay (HOSPITAL_COMMUNITY)
Admission: AD | Admit: 2014-06-19 | Discharge: 2014-06-19 | Disposition: A | Discharge status: Home / Self Care | Payer: BLUE CROSS/BLUE SHIELD | Source: Ambulatory Visit | Attending: Obstetrics & Gynecology | Admitting: Obstetrics & Gynecology

## 2014-06-19 ENCOUNTER — Encounter (HOSPITAL_COMMUNITY): Payer: Self-pay

## 2014-06-19 ENCOUNTER — Ambulatory Visit (INDEPENDENT_AMBULATORY_CARE_PROVIDER_SITE_OTHER): Payer: BLUE CROSS/BLUE SHIELD | Admitting: Family Medicine

## 2014-06-19 VITALS — BP 105/67 | HR 90 | Wt 165.2 lb

## 2014-06-19 DIAGNOSIS — O403XX0 Polyhydramnios, third trimester, not applicable or unspecified: Secondary | ICD-10-CM

## 2014-06-19 DIAGNOSIS — O403XX1 Polyhydramnios, third trimester, fetus 1: Secondary | ICD-10-CM

## 2014-06-19 DIAGNOSIS — Z3A33 33 weeks gestation of pregnancy: Secondary | ICD-10-CM | POA: Diagnosis not present

## 2014-06-19 DIAGNOSIS — O409XX0 Polyhydramnios, unspecified trimester, not applicable or unspecified: Secondary | ICD-10-CM | POA: Insufficient documentation

## 2014-06-19 DIAGNOSIS — O09893 Supervision of other high risk pregnancies, third trimester: Secondary | ICD-10-CM

## 2014-06-19 DIAGNOSIS — O4703 False labor before 37 completed weeks of gestation, third trimester: Secondary | ICD-10-CM | POA: Diagnosis not present

## 2014-06-19 LAB — POCT URINALYSIS DIP (DEVICE)
BILIRUBIN URINE: NEGATIVE
Glucose, UA: NEGATIVE mg/dL
HGB URINE DIPSTICK: NEGATIVE
KETONES UR: NEGATIVE mg/dL
Leukocytes, UA: NEGATIVE
NITRITE: NEGATIVE
Protein, ur: NEGATIVE mg/dL
SPECIFIC GRAVITY, URINE: 1.02 (ref 1.005–1.030)
Urobilinogen, UA: 4 mg/dL — ABNORMAL HIGH (ref 0.0–1.0)
pH: 7.5 (ref 5.0–8.0)

## 2014-06-19 MED ORDER — NIFEDIPINE 10 MG PO CAPS
10.0000 mg | ORAL_CAPSULE | ORAL | Status: DC | PRN
  Administered 2014-06-19 (×2): 10 mg via ORAL
  Filled 2014-06-19 (×2): qty 1

## 2014-06-19 NOTE — Progress Notes (Signed)
Patient is 24 y.o. [id number] 9564w4d.  +FM, denies LOF, VB, vaginal discharge resolved.  Noted to have some contractions on NST, denies any contractions currently.  NST reviewed, reactive, 1 contraction noted with uterine irritability - cervix moderate consistency, very posterior, does not feel labored, no vaginal bleeding => will send to [facility] for monitoring and evaluation of preterm labor

## 2014-06-19 NOTE — MAU Note (Signed)
Pt states here for eval of contrctions, cervix 2cm per clinic this am. Denies bleeding or abnormal vaginal discharge.

## 2014-06-19 NOTE — Discharge Instructions (Signed)

## 2014-06-19 NOTE — MAU Provider Note (Signed)
History     CSN: [id number]  Arrival date and time: 06/19/14 0909   First Provider Initiated Contact with Patient 06/19/14 1010      Chief Complaint  Patient presents with  . preterm contractions    HPI Patient is 24 y.o. [id number] here after being seen at clinic today where she was dilated to 2.5cm.  She was sent here for a preterm labor evaluation.  She hydrating well.  Denies feeling contractions that are being appreciated on toco.  +FM, denies LOF, VB, contractions, vaginal discharge.  OB History    Gravida Para Term Preterm AB TAB SAB Ectopic Multiple Living   3 1 1  1 1    1       Past Medical History  Diagnosis Date  . Asthma     Past Surgical History  Procedure Laterality Date  . Tonsillectomy      History reviewed. No pertinent family history.  History  Substance Use Topics  . Smoking status: Never Smoker   . Smokeless tobacco: Never Used  . Alcohol Use: No    Allergies: No Known Allergies  Prescriptions prior to admission  Medication Sig Dispense Refill Last Dose  . albuterol (PROVENTIL HFA;VENTOLIN HFA) 108 (90 BASE) MCG/ACT inhaler Inhale 1 puff into the lungs every 6 (six) hours as needed for wheezing or shortness of breath.   prn    Review of Systems  Constitutional: Negative for fever.  Gastrointestinal: Negative for nausea, vomiting and diarrhea.  Genitourinary: Negative for dysuria, urgency, frequency, hematuria and flank pain.       +FM, No LOF, contractions, VB, vaginal discharge  Neurological: Negative for weakness.   Physical Exam   Blood pressure 110/66, pulse 78, temperature 97.8 F (36.6 C), temperature source Oral, resp. rate 18, last menstrual period 10/27/2013.  Physical Exam  Constitutional: She is oriented to person, place, and time. She appears well-developed and well-nourished. No distress.  HENT:  Head: Normocephalic and atraumatic.  Eyes: EOM are normal. No scleral icterus.  Neck: Normal range of motion. Neck  supple.  Cardiovascular: Normal rate, regular rhythm, normal heart sounds and intact distal pulses.   Respiratory: Effort normal and breath sounds normal.  GI: Soft. Bowel sounds are normal.  gravid  Genitourinary: Vagina normal. No vaginal discharge found.  Musculoskeletal: Normal range of motion. She exhibits no edema.  Neurological: She is alert and oriented to person, place, and time.  Skin: Skin is warm and dry. She is not diaphoretic.  Psychiatric: She has a normal mood and affect. Her behavior is normal. Judgment and thought content normal.  Dilation: 2 Effacement (%): Thick Cervical Position: Posterior Station: Ballotable Exam by:: Philipp DeputyKim Gaylene Moylan, CNM  Fetal monitoringBaseline: 145 bpm, Variability: Good {> 6 bpm), Accelerations: Reactive and Decelerations: Absent Uterine activityFrequency: Every 5-6 minutes, + uterine irritability   MAU Course  Procedures  MDM  NST reactive UA obtained in office today-unremarkable for infection Procardia x2  Assessment and Plan  Threatened preterm labor: Patient responded well to Procardia.  Cervix unchanged from exam in office.   -Labor precautions reviewed with patient -Patient to follow up in clinic as scheduled for routine care.  Delynn FlavinGottschalk, Ashly M, DO 06/19/2014, 10:11 AM   CNM attestation:  I have seen and examined this patient; I agree with above documentation in the resident's note.   Leotis PainKristen Headen is a 24 y.o. G3P1011 here for eval of ctx noted on NST in office along with cx being dilated +FM, denies LOF, VB, contractions, vaginal  discharge.  ROS: neg other than what is listed in HPI  PE: BP 114/62 mmHg  Pulse 88  Temp(Src) 97.8 F (36.6 C) (Oral)  Resp 16  LMP 10/27/2013 Gen: calm comfortable, NAD Resp: normal effort, no distress Abd: gravid  ROS, labs, PMH reviewed NST reactive +accels, no decels Ctx initially in a pattern of q 5-6 mins; at discharge have spaced out after Procardia given  Plan: - fetal kick  counts reinforced, preterm labor precautions rev'd - continue routine follow up in OB clinic  Phillipa Morden, CNM 11:21 AM

## 2014-06-19 NOTE — Addendum Note (Signed)
Addended by: Jill SideAY, DIANE L on: 06/19/2014 08:22 AM   Modules accepted: Orders

## 2014-06-19 NOTE — Progress Notes (Signed)
[place] the beach. She is not aware of UC's present during NST today - provider will examine cervix.

## 2014-06-19 NOTE — Progress Notes (Signed)
AGottschalk, resident, notified of pt in MAU for eval of PTL from clinic, will come see pt shortly.

## 2014-06-19 NOTE — MAU Note (Signed)
Pt sent from clinic, SVE 2 cm's, uc's noted on monitor, pt states she does not feel any uc's.  Denies bleeding or LOF.

## 2014-06-22 ENCOUNTER — Ambulatory Visit (INDEPENDENT_AMBULATORY_CARE_PROVIDER_SITE_OTHER): Payer: BLUE CROSS/BLUE SHIELD | Admitting: *Deleted

## 2014-06-22 VITALS — BP 108/71 | HR 88

## 2014-06-22 DIAGNOSIS — O403XX1 Polyhydramnios, third trimester, fetus 1: Secondary | ICD-10-CM

## 2014-06-22 NOTE — Progress Notes (Signed)
NST reviewed and reactive.  

## 2014-06-25 ENCOUNTER — Ambulatory Visit (INDEPENDENT_AMBULATORY_CARE_PROVIDER_SITE_OTHER): Payer: BLUE CROSS/BLUE SHIELD | Admitting: Family Medicine

## 2014-06-25 VITALS — BP 103/64 | HR 101 | Wt 163.4 lb

## 2014-06-25 DIAGNOSIS — Z3482 Encounter for supervision of other normal pregnancy, second trimester: Secondary | ICD-10-CM

## 2014-06-25 DIAGNOSIS — O403XX1 Polyhydramnios, third trimester, fetus 1: Secondary | ICD-10-CM | POA: Diagnosis not present

## 2014-06-25 LAB — POCT URINALYSIS DIP (DEVICE)
BILIRUBIN URINE: NEGATIVE
Glucose, UA: NEGATIVE mg/dL
Hgb urine dipstick: NEGATIVE
KETONES UR: NEGATIVE mg/dL
Nitrite: NEGATIVE
Protein, ur: NEGATIVE mg/dL
Specific Gravity, Urine: 1.02 (ref 1.005–1.030)
Urobilinogen, UA: 1 mg/dL (ref 0.0–1.0)
pH: 7.5 (ref 5.0–8.0)

## 2014-06-25 NOTE — Patient Instructions (Signed)
Third Trimester of Pregnancy The third trimester is from week 29 through week 42, months 7 through 9. The third trimester is a time when the fetus is growing rapidly. At the end of the ninth month, the fetus is about 20 inches in length and weighs 6-10 pounds.  BODY CHANGES Your body goes through many changes during pregnancy. The changes vary from woman to woman.   Your weight will continue to increase. You can expect to gain 25-35 pounds (11-16 kg) by the end of the pregnancy.  You may begin to get stretch marks on your hips, abdomen, and breasts.  You may urinate more often because the fetus is moving lower into your pelvis and pressing on your bladder.  You may develop or continue to have heartburn as a result of your pregnancy.  You may develop constipation because certain hormones are causing the muscles that push waste through your intestines to slow down.  You may develop hemorrhoids or swollen, bulging veins (varicose veins).  You may have pelvic pain because of the weight gain and pregnancy hormones relaxing your joints between the bones in your pelvis. Backaches may result from overexertion of the muscles supporting your posture.  You may have changes in your hair. These can include thickening of your hair, rapid growth, and changes in texture. Some women also have hair loss during or after pregnancy, or hair that feels dry or thin. Your hair will most likely return to normal after your baby is born.  Your breasts will continue to grow and be tender. A yellow discharge may leak from your breasts called colostrum.  Your belly button may stick out.  You may feel short of breath because of your expanding uterus.  You may notice the fetus "dropping," or moving lower in your abdomen.  You may have a bloody mucus discharge. This usually occurs a few days to a week before labor begins.  Your cervix becomes thin and soft (effaced) near your due date. WHAT TO EXPECT AT YOUR  PRENATAL EXAMS  You will have prenatal exams every 2 weeks until week 36. Then, you will have weekly prenatal exams. During a routine prenatal visit:  You will be weighed to make sure you and the fetus are growing normally.  Your blood pressure is taken.  Your abdomen will be measured to track your baby's growth.  The fetal heartbeat will be listened to.  Any test results from the previous visit will be discussed.  You may have a cervical check near your due date to see if you have effaced. At around 36 weeks, your caregiver will check your cervix. At the same time, your caregiver will also perform a test on the secretions of the vaginal tissue. This test is to determine if a type of bacteria, Group B streptococcus, is present. Your caregiver will explain this further. Your caregiver may ask you:  What your birth plan is.  How you are feeling.  If you are feeling the baby move.  If you have had any abnormal symptoms, such as leaking fluid, bleeding, severe headaches, or abdominal cramping.  If you have any questions. Other tests or screenings that may be performed during your third trimester include:  Blood tests that check for low iron levels (anemia).  Fetal testing to check the health, activity level, and growth of the fetus. Testing is done if you have certain medical conditions or if there are problems during the pregnancy. FALSE LABOR You may feel small, irregular contractions that   eventually go away. These are called Braxton Hicks contractions, or false labor. Contractions may last for hours, days, or even weeks before true labor sets in. If contractions come at regular intervals, intensify, or become painful, it is best to be seen by your caregiver.  SIGNS OF LABOR   Menstrual-like cramps.  Contractions that are 5 minutes apart or less.  Contractions that start on the top of the uterus and spread down to the lower abdomen and back.  A sense of increased pelvic  pressure or back pain.  A watery or bloody mucus discharge that comes from the vagina. If you have any of these signs before the 37th week of pregnancy, call your caregiver right away. You need to go to the hospital to get checked immediately. HOME CARE INSTRUCTIONS   Avoid all smoking, herbs, alcohol, and unprescribed drugs. These chemicals affect the formation and growth of the baby.  Follow your caregiver's instructions regarding medicine use. There are medicines that are either safe or unsafe to take during pregnancy.  Exercise only as directed by your caregiver. Experiencing uterine cramps is a good sign to stop exercising.  Continue to eat regular, healthy meals.  Wear a good support bra for breast tenderness.  Do not use hot tubs, steam rooms, or saunas.  Wear your seat belt at all times when driving.  Avoid raw meat, uncooked cheese, cat litter boxes, and soil used by cats. These carry germs that can cause birth defects in the baby.  Take your prenatal vitamins.  Try taking a stool softener (if your caregiver approves) if you develop constipation. Eat more high-fiber foods, such as fresh vegetables or fruit and whole grains. Drink plenty of fluids to keep your urine clear or pale yellow.  Take warm sitz baths to soothe any pain or discomfort caused by hemorrhoids. Use hemorrhoid cream if your caregiver approves.  If you develop varicose veins, wear support hose. Elevate your feet for 15 minutes, 3-4 times a day. Limit salt in your diet.  Avoid heavy lifting, wear low heal shoes, and practice good posture.  Rest a lot with your legs elevated if you have leg cramps or low back pain.  Visit your dentist if you have not gone during your pregnancy. Use a soft toothbrush to brush your teeth and be gentle when you floss.  A sexual relationship may be continued unless your caregiver directs you otherwise.  Do not travel far distances unless it is absolutely necessary and only  with the approval of your caregiver.  Take prenatal classes to understand, practice, and ask questions about the labor and delivery.  Make a trial run to the hospital.  Pack your hospital bag.  Prepare the baby's nursery.  Continue to go to all your prenatal visits as directed by your caregiver. SEEK MEDICAL CARE IF:  You are unsure if you are in labor or if your water has broken.  You have dizziness.  You have mild pelvic cramps, pelvic pressure, or nagging pain in your abdominal area.  You have persistent nausea, vomiting, or diarrhea.  You have a bad smelling vaginal discharge.  You have pain with urination. SEEK IMMEDIATE MEDICAL CARE IF:   You have a fever.  You are leaking fluid from your vagina.  You have spotting or bleeding from your vagina.  You have severe abdominal cramping or pain.  You have rapid weight loss or gain.  You have shortness of breath with chest pain.  You notice sudden or extreme swelling   of your face, hands, ankles, feet, or legs.  You have not felt your baby move in over an hour.  You have severe headaches that do not go away with medicine.  You have vision changes. Document Released: 01/17/2001 Document Revised: 01/28/2013 Document Reviewed: 03/26/2012 [employer] Patient Information 2015 [employer]. This information is not intended to replace advice given to you by your health care provider. Make sure you discuss any questions you have with your health care provider.  Breastfeeding Deciding to breastfeed is one of the best choices you can make for you and your baby. A change in hormones during pregnancy causes your breast tissue to grow and increases the number and size of your milk ducts. These hormones also allow proteins, sugars, and fats from your blood supply to make breast milk in your milk-producing glands. Hormones prevent breast milk from being released before your baby is born as well as prompt milk flow after birth. Once  breastfeeding has begun, thoughts of your baby, as well as his or her sucking or crying, can stimulate the release of milk from your milk-producing glands.  BENEFITS OF BREASTFEEDING For Your Baby  Your first milk (colostrum) helps your baby's digestive system function better.   There are antibodies in your milk that help your baby fight off infections.   Your baby has a lower incidence of asthma, allergies, and sudden infant death syndrome.   The nutrients in breast milk are better for your baby than infant formulas and are designed uniquely for your baby's needs.   Breast milk improves your baby's brain development.   Your baby is less likely to develop other conditions, such as childhood obesity, asthma, or type 2 diabetes mellitus.  For You   Breastfeeding helps to create a very special bond between you and your baby.   Breastfeeding is convenient. Breast milk is always available at the correct temperature and costs nothing.   Breastfeeding helps to burn calories and helps you lose the weight gained during pregnancy.   Breastfeeding makes your uterus contract to its prepregnancy size faster and slows bleeding (lochia) after you give birth.   Breastfeeding helps to lower your risk of developing type 2 diabetes mellitus, osteoporosis, and breast or ovarian cancer later in life. SIGNS THAT YOUR BABY IS HUNGRY Early Signs of Hunger  Increased alertness or activity.  Stretching.  Movement of the head from side to side.  Movement of the head and opening of the mouth when the corner of the mouth or cheek is stroked (rooting).  Increased sucking sounds, smacking lips, cooing, sighing, or squeaking.  Hand-to-mouth movements.  Increased sucking of fingers or hands. Late Signs of Hunger  Fussing.  Intermittent crying. Extreme Signs of Hunger Signs of extreme hunger will require calming and consoling before your baby will be able to breastfeed successfully. Do not  wait for the following signs of extreme hunger to occur before you initiate breastfeeding:   Restlessness.  A loud, strong cry.   Screaming. BREASTFEEDING BASICS Breastfeeding Initiation  Find a comfortable place to sit or lie down, with your neck and back well supported.  Place a pillow or rolled up blanket under your baby to bring him or her to the level of your breast (if you are seated). Nursing pillows are specially designed to help support your arms and your baby while you breastfeed.  Make sure that your baby's abdomen is facing your abdomen.   Gently massage your breast. With your fingertips, massage from your chest   wall toward your nipple in a circular motion. This encourages milk flow. You may need to continue this action during the feeding if your milk flows slowly.  Support your breast with 4 fingers underneath and your thumb above your nipple. Make sure your fingers are well away from your nipple and your baby's mouth.   Stroke your baby's lips gently with your finger or nipple.   When your baby's mouth is open wide enough, quickly bring your baby to your breast, placing your entire nipple and as much of the colored area around your nipple (areola) as possible into your baby's mouth.   More areola should be visible above your baby's upper lip than below the lower lip.   Your baby's tongue should be between his or her lower gum and your breast.   Ensure that your baby's mouth is correctly positioned around your nipple (latched). Your baby's lips should create a seal on your breast and be turned out (everted).  It is common for your baby to suck about 2-3 minutes in order to start the flow of breast milk. Latching Teaching your baby how to latch on to your breast properly is very important. An improper latch can cause nipple pain and decreased milk supply for you and poor weight gain in your baby. Also, if your baby is not latched onto your nipple properly, he or she  may swallow some air during feeding. This can make your baby fussy. Burping your baby when you switch breasts during the feeding can help to get rid of the air. However, teaching your baby to latch on properly is still the best way to prevent fussiness from swallowing air while breastfeeding. Signs that your baby has successfully latched on to your nipple:    Silent tugging or silent sucking, without causing you pain.   Swallowing heard between every 3-4 sucks.    Muscle movement above and in front of his or her ears while sucking.  Signs that your baby has not successfully latched on to nipple:   Sucking sounds or smacking sounds from your baby while breastfeeding.  Nipple pain. If you think your baby has not latched on correctly, slip your finger into the corner of your baby's mouth to break the suction and place it between your baby's gums. Attempt breastfeeding initiation again. Signs of Successful Breastfeeding Signs from your baby:   A gradual decrease in the number of sucks or complete cessation of sucking.   Falling asleep.   Relaxation of his or her body.   Retention of a small amount of milk in his or her mouth.   Letting go of your breast by himself or herself. Signs from you:  Breasts that have increased in firmness, weight, and size 1-3 hours after feeding.   Breasts that are softer immediately after breastfeeding.  Increased milk volume, as well as a change in milk consistency and color by the fifth day of breastfeeding.   Nipples that are not sore, cracked, or bleeding. Signs That Your Baby is Getting Enough Milk  Wetting at least 3 diapers in a 24-hour period. The urine should be clear and pale yellow by age 5 days.  At least 3 stools in a 24-hour period by age 5 days. The stool should be soft and yellow.  At least 3 stools in a 24-hour period by age 7 days. The stool should be seedy and yellow.  No loss of weight greater than 10% of birth weight  during the first 3   days of age.  Average weight gain of 4-7 ounces (113-198 g) per week after age 4 days.  Consistent daily weight gain by age 5 days, without weight loss after the age of 2 weeks. After a feeding, your baby may spit up a small amount. This is common. BREASTFEEDING FREQUENCY AND DURATION Frequent feeding will help you make more milk and can prevent sore nipples and breast engorgement. Breastfeed when you feel the need to reduce the fullness of your breasts or when your baby shows signs of hunger. This is called "breastfeeding on demand." Avoid introducing a pacifier to your baby while you are working to establish breastfeeding (the first 4-6 weeks after your baby is born). After this time you may choose to use a pacifier. Research has shown that pacifier use during the first year of a baby's life decreases the risk of sudden infant death syndrome (SIDS). Allow your baby to feed on each breast as long as he or she wants. Breastfeed until your baby is finished feeding. When your baby unlatches or falls asleep while feeding from the first breast, offer the second breast. Because newborns are often sleepy in the first few weeks of life, you may need to awaken your baby to get him or her to feed. Breastfeeding times will vary from baby to baby. However, the following rules can serve as a guide to help you ensure that your baby is properly fed:  Newborns (babies 4 weeks of age or younger) may breastfeed every 1-3 hours.  Newborns should not go longer than 3 hours during the day or 5 hours during the night without breastfeeding.  You should breastfeed your baby a minimum of 8 times in a 24-hour period until you begin to introduce solid foods to your baby at around 6 months of age. BREAST MILK PUMPING Pumping and storing breast milk allows you to ensure that your baby is exclusively fed your breast milk, even at times when you are unable to breastfeed. This is especially important if you are  going back to work while you are still breastfeeding or when you are not able to be present during feedings. Your lactation consultant can give you guidelines on how long it is safe to store breast milk.  A breast pump is a machine that allows you to pump milk from your breast into a sterile bottle. The pumped breast milk can then be stored in a refrigerator or freezer. Some breast pumps are operated by hand, while others use electricity. Ask your lactation consultant which type will work best for you. Breast pumps can be purchased, but some hospitals and breastfeeding support groups lease breast pumps on a monthly basis. A lactation consultant can teach you how to hand express breast milk, if you prefer not to use a pump.  CARING FOR YOUR BREASTS WHILE YOU BREASTFEED Nipples can become dry, cracked, and sore while breastfeeding. The following recommendations can help keep your breasts moisturized and healthy:  Avoid using soap on your nipples.   Wear a supportive bra. Although not required, special nursing bras and tank tops are designed to allow access to your breasts for breastfeeding without taking off your entire bra or top. Avoid wearing underwire-style bras or extremely tight bras.  Air dry your nipples for 3-4minutes after each feeding.   Use only cotton bra pads to absorb leaked breast milk. Leaking of breast milk between feedings is normal.   Use lanolin on your nipples after breastfeeding. Lanolin helps to maintain your skin's   normal moisture barrier. If you use pure lanolin, you do not need to wash it off before feeding your baby again. Pure lanolin is not toxic to your baby. You may also hand express a few drops of breast milk and gently massage that milk into your nipples and allow the milk to air dry. In the first few weeks after giving birth, some women experience extremely full breasts (engorgement). Engorgement can make your breasts feel heavy, warm, and tender to the touch.  Engorgement peaks within 3-5 days after you give birth. The following recommendations can help ease engorgement:  Completely empty your breasts while breastfeeding or pumping. You may want to start by applying warm, moist heat (in the shower or with warm water-soaked hand towels) just before feeding or pumping. This increases circulation and helps the milk flow. If your baby does not completely empty your breasts while breastfeeding, pump any extra milk after he or she is finished.  Wear a snug bra (nursing or regular) or tank top for 1-2 days to signal your body to slightly decrease milk production.  Apply ice packs to your breasts, unless this is too uncomfortable for you.  Make sure that your baby is latched on and positioned properly while breastfeeding. If engorgement persists after 48 hours of following these recommendations, contact your health care provider or a lactation consultant. OVERALL HEALTH CARE RECOMMENDATIONS WHILE BREASTFEEDING  Eat healthy foods. Alternate between meals and snacks, eating 3 of each per day. Because what you eat affects your breast milk, some of the foods may make your baby more irritable than usual. Avoid eating these foods if you are sure that they are negatively affecting your baby.  Drink milk, fruit juice, and water to satisfy your thirst (about 10 glasses a day).   Rest often, relax, and continue to take your prenatal vitamins to prevent fatigue, stress, and anemia.  Continue breast self-awareness checks.  Avoid chewing and smoking tobacco.  Avoid alcohol and drug use. Some medicines that may be harmful to your baby can pass through breast milk. It is important to ask your health care provider before taking any medicine, including all over-the-counter and prescription medicine as well as vitamin and herbal supplements. It is possible to become pregnant while breastfeeding. If birth control is desired, ask your health care provider about options that  will be safe for your baby. SEEK MEDICAL CARE IF:   You feel like you want to stop breastfeeding or have become frustrated with breastfeeding.  You have painful breasts or nipples.  Your nipples are cracked or bleeding.  Your breasts are red, tender, or warm.  You have a swollen area on either breast.  You have a fever or chills.  You have nausea or vomiting.  You have drainage other than breast milk from your nipples.  Your breasts do not become full before feedings by the fifth day after you give birth.  You feel sad and depressed.  Your baby is too sleepy to eat well.  Your baby is having trouble sleeping.   Your baby is wetting less than 3 diapers in a 24-hour period.  Your baby has less than 3 stools in a 24-hour period.  Your baby's skin or the white part of his or her eyes becomes yellow.   Your baby is not gaining weight by 5 days of age. SEEK IMMEDIATE MEDICAL CARE IF:   Your baby is overly tired (lethargic) and does not want to wake up and feed.  Your baby   develops an unexplained fever. Document Released: 01/23/2005 Document Revised: 01/28/2013 Document Reviewed: 07/17/2012 [employer] Patient Information 2015 [employer]. This information is not intended to replace advice given to you by your health care provider. Make sure you discuss any questions you have with your health care provider.  

## 2014-06-25 NOTE — Progress Notes (Signed)
NST reviewed and reactive. AFI is 18--If fluid remains normal over next several weeks, we will consider not having to induce patient and may not need 2x/wk testing. She would prefer no induction Pt. Lives in [place], but works in [place], will remain at this clinic

## 2014-06-25 NOTE — Progress Notes (Signed)
US for growth scheduled 6/16

## 2014-06-29 ENCOUNTER — Ambulatory Visit (INDEPENDENT_AMBULATORY_CARE_PROVIDER_SITE_OTHER): Payer: BLUE CROSS/BLUE SHIELD | Admitting: *Deleted

## 2014-06-29 VITALS — BP 106/67 | HR 97

## 2014-06-29 DIAGNOSIS — O403XX1 Polyhydramnios, third trimester, fetus 1: Secondary | ICD-10-CM

## 2014-07-02 ENCOUNTER — Encounter: Payer: Self-pay | Admitting: Obstetrics and Gynecology

## 2014-07-02 ENCOUNTER — Ambulatory Visit (INDEPENDENT_AMBULATORY_CARE_PROVIDER_SITE_OTHER): Payer: BLUE CROSS/BLUE SHIELD | Admitting: Obstetrics and Gynecology

## 2014-07-02 VITALS — BP 105/72 | HR 88 | Wt 165.9 lb

## 2014-07-02 DIAGNOSIS — Z3482 Encounter for supervision of other normal pregnancy, second trimester: Secondary | ICD-10-CM

## 2014-07-02 DIAGNOSIS — O9989 Other specified diseases and conditions complicating pregnancy, childbirth and the puerperium: Secondary | ICD-10-CM

## 2014-07-02 DIAGNOSIS — O99519 Diseases of the respiratory system complicating pregnancy, unspecified trimester: Secondary | ICD-10-CM

## 2014-07-02 DIAGNOSIS — O403XX0 Polyhydramnios, third trimester, not applicable or unspecified: Secondary | ICD-10-CM

## 2014-07-02 DIAGNOSIS — O403XX1 Polyhydramnios, third trimester, fetus 1: Secondary | ICD-10-CM

## 2014-07-02 DIAGNOSIS — J45909 Unspecified asthma, uncomplicated: Secondary | ICD-10-CM

## 2014-07-02 DIAGNOSIS — Z3483 Encounter for supervision of other normal pregnancy, third trimester: Secondary | ICD-10-CM

## 2014-07-02 LAB — OB RESULTS CONSOLE GC/CHLAMYDIA
Chlamydia: NEGATIVE
GC PROBE AMP, GENITAL: NEGATIVE

## 2014-07-02 LAB — POCT URINALYSIS DIP (DEVICE)
Bilirubin Urine: NEGATIVE
Glucose, UA: NEGATIVE mg/dL
Hgb urine dipstick: NEGATIVE
KETONES UR: NEGATIVE mg/dL
Leukocytes, UA: NEGATIVE
NITRITE: NEGATIVE
Protein, ur: NEGATIVE mg/dL
Specific Gravity, Urine: 1.02 (ref 1.005–1.030)
Urobilinogen, UA: 4 mg/dL — ABNORMAL HIGH (ref 0.0–1.0)
pH: 7 (ref 5.0–8.0)

## 2014-07-02 LAB — OB RESULTS CONSOLE GBS: GBS: NEGATIVE

## 2014-07-02 NOTE — Progress Notes (Signed)
Patient is doing well without complaints. FM/labor precautions reviewed. Cultures collected. NST reviewed and reactive AFI normal x 2. Will stop fetal testing Follow up growth ultrasound already scheduled

## 2014-07-02 NOTE — Progress Notes (Signed)
Pt reports increased pelvic pressure and pain - states "I am miserable."

## 2014-07-03 LAB — GC/CHLAMYDIA PROBE AMP
CT PROBE, AMP APTIMA: NEGATIVE
GC Probe RNA: NEGATIVE

## 2014-07-04 LAB — CULTURE, BETA STREP (GROUP B ONLY)

## 2014-07-07 ENCOUNTER — Other Ambulatory Visit: Payer: BLUE CROSS/BLUE SHIELD

## 2014-07-09 ENCOUNTER — Ambulatory Visit (INDEPENDENT_AMBULATORY_CARE_PROVIDER_SITE_OTHER): Payer: BLUE CROSS/BLUE SHIELD | Admitting: Obstetrics & Gynecology

## 2014-07-09 ENCOUNTER — Encounter: Payer: Self-pay | Admitting: Obstetrics & Gynecology

## 2014-07-09 VITALS — BP 110/72 | HR 96 | Wt 167.7 lb

## 2014-07-09 DIAGNOSIS — J45909 Unspecified asthma, uncomplicated: Secondary | ICD-10-CM

## 2014-07-09 DIAGNOSIS — O9989 Other specified diseases and conditions complicating pregnancy, childbirth and the puerperium: Secondary | ICD-10-CM

## 2014-07-09 DIAGNOSIS — O99519 Diseases of the respiratory system complicating pregnancy, unspecified trimester: Principal | ICD-10-CM

## 2014-07-09 LAB — POCT URINALYSIS DIP (DEVICE)
GLUCOSE, UA: NEGATIVE mg/dL
Hgb urine dipstick: NEGATIVE
Ketones, ur: NEGATIVE mg/dL
NITRITE: NEGATIVE
PROTEIN: NEGATIVE mg/dL
Specific Gravity, Urine: 1.015 (ref 1.005–1.030)
Urobilinogen, UA: 1 mg/dL (ref 0.0–1.0)
pH: 7 (ref 5.0–8.0)

## 2014-07-09 MED ORDER — BECLOMETHASONE DIPROPIONATE 40 MCG/ACT IN AERS: 1.0000 | INHALATION_SPRAY | Freq: Two times a day (BID) | RESPIRATORY_TRACT | Status: DC

## 2014-07-09 NOTE — Progress Notes (Signed)
Asthma not well controlled, using albuterol inhaler daily, will Rx steroid inhaler for maintenance

## 2014-07-09 NOTE — Progress Notes (Signed)
Pt has had normal AFI x2 weeks - fetal testing no longer indicated.

## 2014-07-09 NOTE — Patient Instructions (Signed)

## 2014-07-13 ENCOUNTER — Other Ambulatory Visit: Payer: BLUE CROSS/BLUE SHIELD

## 2014-07-16 ENCOUNTER — Other Ambulatory Visit: Payer: BLUE CROSS/BLUE SHIELD

## 2014-07-16 ENCOUNTER — Encounter: Payer: Self-pay | Admitting: Obstetrics & Gynecology

## 2014-07-16 ENCOUNTER — Ambulatory Visit: Payer: BLUE CROSS/BLUE SHIELD | Admitting: Obstetrics & Gynecology

## 2014-07-16 VITALS — BP 120/73 | HR 73 | Temp 97.7°F | Wt 168.5 lb

## 2014-07-16 DIAGNOSIS — O99519 Diseases of the respiratory system complicating pregnancy, unspecified trimester: Secondary | ICD-10-CM

## 2014-07-16 DIAGNOSIS — Z3483 Encounter for supervision of other normal pregnancy, third trimester: Secondary | ICD-10-CM

## 2014-07-16 DIAGNOSIS — J45909 Unspecified asthma, uncomplicated: Secondary | ICD-10-CM

## 2014-07-16 NOTE — Patient Instructions (Signed)
Return to clinic for any obstetric concerns or go to [facility] for evaluation  

## 2014-07-16 NOTE — Progress Notes (Signed)
Small leuks noted in urine.  

## 2014-07-16 NOTE — Progress Notes (Signed)
Breastfeeding Tip Reviewed

## 2014-07-16 NOTE — Progress Notes (Addendum)
Subjective:   Carrie [name] is a 24 y.o. G3P1011 at [redacted]w[redacted]d being seen today for ongoing prenatal care.  Patient reports no complaints.  Contractions: Irregular.  Vag. Bleeding: None.  Fetal Movement: Present. Denies leaking of fluid.   The following portions of the patient's history were reviewed and updated as appropriate: allergies, current medications, past family history, past medical history, past social history, past surgical history and problem list.   Objective:   Filed Vitals:   07/16/14 0750  BP: 120/73  Pulse: 73  Temp: 97.7 F (36.5 C)  Weight: 168 lb 8 oz (76.431 kg)    Fetal Status: Fetal Heart Rate (bpm): 129 Fundal Height: 37 cm Movement: Present  Presentation: Vertex  General:  Alert, oriented and cooperative. Patient is in no acute distress.  Skin: Skin is warm and dry. No rash noted.   Cardiovascular: Normal heart rate noted  Respiratory: Effort and breath sounds normal, no problems with respiration noted  Abdomen: Soft, gravid, appropriate for gestational age. Pain/Pressure: Present     Vaginal: Vag. Bleeding: None.    Vag D/C Character: Watery  Cervix: Dilation: 2 Effacement (%): 50 Station: -3  Extremities: Normal range of motion.  Edema: None  Mental Status: Normal mood and affect. Normal behavior. Normal judgment and thought content.   Urinalysis: Urine Protein: Negative Urine Glucose: Negative   Assessment and Plan:   Pregnancy: G3P1011 at [redacted]w[redacted]d  1. Encounter for supervision of other normal pregnancy in third trimester Stable cervical exam  2. Asthma affecting pregnancy, antepartum Stable on steroid and rescue inhalers   Term labor symptoms and general obstetric precautions including but not limited to vaginal bleeding, contractions, leaking of fluid and fetal movement were reviewed in detail with the patient.  Please refer to After Visit Summary for other counseling recommendations.   Return in about 1 week (around 07/23/2014) for OB  visit.   Tereso Newcomer, MD

## 2014-07-16 NOTE — Addendum Note (Signed)
Addended by: Jaynie Collins A on: 07/16/2014 08:50 AM   Modules accepted: Level of Service

## 2014-07-17 LAB — POCT URINALYSIS DIP (DEVICE)
BILIRUBIN URINE: NEGATIVE
Glucose, UA: NEGATIVE mg/dL
Hgb urine dipstick: NEGATIVE
Ketones, ur: NEGATIVE mg/dL
Nitrite: NEGATIVE
PH: 7 (ref 5.0–8.0)
PROTEIN: NEGATIVE mg/dL
SPECIFIC GRAVITY, URINE: 1.025 (ref 1.005–1.030)
Urobilinogen, UA: 2 mg/dL — ABNORMAL HIGH (ref 0.0–1.0)

## 2014-07-20 ENCOUNTER — Other Ambulatory Visit: Payer: BLUE CROSS/BLUE SHIELD

## 2014-07-22 ENCOUNTER — Encounter: Payer: Self-pay | Admitting: Obstetrics & Gynecology

## 2014-07-23 ENCOUNTER — Ambulatory Visit (HOSPITAL_COMMUNITY)
Admission: RE | Admit: 2014-07-23 | Discharge: 2014-07-23 | Disposition: A | Discharge status: Home / Self Care | Payer: BLUE CROSS/BLUE SHIELD | Source: Ambulatory Visit | Attending: Family Medicine | Admitting: Family Medicine

## 2014-07-23 ENCOUNTER — Ambulatory Visit (HOSPITAL_COMMUNITY): Payer: BLUE CROSS/BLUE SHIELD

## 2014-07-23 ENCOUNTER — Ambulatory Visit (INDEPENDENT_AMBULATORY_CARE_PROVIDER_SITE_OTHER): Payer: BLUE CROSS/BLUE SHIELD | Admitting: Family Medicine

## 2014-07-23 VITALS — BP 115/75 | HR 88 | Wt 171.5 lb

## 2014-07-23 DIAGNOSIS — O403XX0 Polyhydramnios, third trimester, not applicable or unspecified: Secondary | ICD-10-CM | POA: Insufficient documentation

## 2014-07-23 DIAGNOSIS — Z3483 Encounter for supervision of other normal pregnancy, third trimester: Secondary | ICD-10-CM

## 2014-07-23 DIAGNOSIS — Z3A38 38 weeks gestation of pregnancy: Secondary | ICD-10-CM | POA: Diagnosis not present

## 2014-07-23 DIAGNOSIS — O403XX1 Polyhydramnios, third trimester, fetus 1: Secondary | ICD-10-CM

## 2014-07-23 DIAGNOSIS — O409XX0 Polyhydramnios, unspecified trimester, not applicable or unspecified: Secondary | ICD-10-CM | POA: Insufficient documentation

## 2014-07-23 LAB — POCT URINALYSIS DIP (DEVICE)
Bilirubin Urine: NEGATIVE
Glucose, UA: NEGATIVE mg/dL
Hgb urine dipstick: NEGATIVE
KETONES UR: NEGATIVE mg/dL
Nitrite: NEGATIVE
Protein, ur: NEGATIVE mg/dL
Specific Gravity, Urine: 1.02 (ref 1.005–1.030)
Urobilinogen, UA: 1 mg/dL (ref 0.0–1.0)
pH: 7 (ref 5.0–8.0)

## 2014-07-23 NOTE — Progress Notes (Signed)
No concerns.  Intermittent contractions.  Some vaginal pressure.  Having some swelling today in feet and ankles.   Labor precautions and fetal movement precautions given.

## 2014-07-23 NOTE — Patient Instructions (Signed)
Third Trimester of Pregnancy The third trimester is from week 29 through week 42, months 7 through 9. The third trimester is a time when the fetus is growing rapidly. At the end of the ninth month, the fetus is about 20 inches in length and weighs 6-10 pounds.  BODY CHANGES Your body goes through many changes during pregnancy. The changes vary from woman to woman.   Your weight will continue to increase. You can expect to gain 25-35 pounds (11-16 kg) by the end of the pregnancy.  You may begin to get stretch marks on your hips, abdomen, and breasts.  You may urinate more often because the fetus is moving lower into your pelvis and pressing on your bladder.  You may develop or continue to have heartburn as a result of your pregnancy.  You may develop constipation because certain hormones are causing the muscles that push waste through your intestines to slow down.  You may develop hemorrhoids or swollen, bulging veins (varicose veins).  You may have pelvic pain because of the weight gain and pregnancy hormones relaxing your joints between the bones in your pelvis. Backaches may result from overexertion of the muscles supporting your posture.  You may have changes in your hair. These can include thickening of your hair, rapid growth, and changes in texture. Some women also have hair loss during or after pregnancy, or hair that feels dry or thin. Your hair will most likely return to normal after your baby is born.  Your breasts will continue to grow and be tender. A yellow discharge may leak from your breasts called colostrum.  Your belly button may stick out.  You may feel short of breath because of your expanding uterus.  You may notice the fetus "dropping," or moving lower in your abdomen.  You may have a bloody mucus discharge. This usually occurs a few days to a week before labor begins.  Your cervix becomes thin and soft (effaced) near your due date. WHAT TO EXPECT AT YOUR PRENATAL  EXAMS  You will have prenatal exams every 2 weeks until week 36. Then, you will have weekly prenatal exams. During a routine prenatal visit:  You will be weighed to make sure you and the fetus are growing normally.  Your blood pressure is taken.  Your abdomen will be measured to track your baby's growth.  The fetal heartbeat will be listened to.  Any test results from the previous visit will be discussed.  You may have a cervical check near your due date to see if you have effaced. At around 36 weeks, your caregiver will check your cervix. At the same time, your caregiver will also perform a test on the secretions of the vaginal tissue. This test is to determine if a type of bacteria, Group B streptococcus, is present. Your caregiver will explain this further. Your caregiver may ask you:  What your birth plan is.  How you are feeling.  If you are feeling the baby move.  If you have had any abnormal symptoms, such as leaking fluid, bleeding, severe headaches, or abdominal cramping.  If you have any questions. Other tests or screenings that may be performed during your third trimester include:  Blood tests that check for low iron levels (anemia).  Fetal testing to check the health, activity level, and growth of the fetus. Testing is done if you have certain medical conditions or if there are problems during the pregnancy. FALSE LABOR You may feel small, irregular contractions that   eventually go away. These are called Braxton Hicks contractions, or false labor. Contractions may last for hours, days, or even weeks before true labor sets in. If contractions come at regular intervals, intensify, or become painful, it is best to be seen by your caregiver.  SIGNS OF LABOR   Menstrual-like cramps.  Contractions that are 5 minutes apart or less.  Contractions that start on the top of the uterus and spread down to the lower abdomen and back.  A sense of increased pelvic pressure or back  pain.  A watery or bloody mucus discharge that comes from the vagina. If you have any of these signs before the 37th week of pregnancy, call your caregiver right away. You need to go to the hospital to get checked immediately. HOME CARE INSTRUCTIONS   Avoid all smoking, herbs, alcohol, and unprescribed drugs. These chemicals affect the formation and growth of the baby.  Follow your caregiver's instructions regarding medicine use. There are medicines that are either safe or unsafe to take during pregnancy.  Exercise only as directed by your caregiver. Experiencing uterine cramps is a good sign to stop exercising.  Continue to eat regular, healthy meals.  Wear a good support bra for breast tenderness.  Do not use hot tubs, steam rooms, or saunas.  Wear your seat belt at all times when driving.  Avoid raw meat, uncooked cheese, cat litter boxes, and soil used by cats. These carry germs that can cause birth defects in the baby.  Take your prenatal vitamins.  Try taking a stool softener (if your caregiver approves) if you develop constipation. Eat more high-fiber foods, such as fresh vegetables or fruit and whole grains. Drink plenty of fluids to keep your urine clear or pale yellow.  Take warm sitz baths to soothe any pain or discomfort caused by hemorrhoids. Use hemorrhoid cream if your caregiver approves.  If you develop varicose veins, wear support hose. Elevate your feet for 15 minutes, 3-4 times a day. Limit salt in your diet.  Avoid heavy lifting, wear low heal shoes, and practice good posture.  Rest a lot with your legs elevated if you have leg cramps or low back pain.  Visit your dentist if you have not gone during your pregnancy. Use a soft toothbrush to brush your teeth and be gentle when you floss.  A sexual relationship may be continued unless your caregiver directs you otherwise.  Do not travel far distances unless it is absolutely necessary and only with the approval  of your caregiver.  Take prenatal classes to understand, practice, and ask questions about the labor and delivery.  Make a trial run to the hospital.  Pack your hospital bag.  Prepare the baby's nursery.  Continue to go to all your prenatal visits as directed by your caregiver. SEEK MEDICAL CARE IF:  You are unsure if you are in labor or if your water has broken.  You have dizziness.  You have mild pelvic cramps, pelvic pressure, or nagging pain in your abdominal area.  You have persistent nausea, vomiting, or diarrhea.  You have a bad smelling vaginal discharge.  You have pain with urination. SEEK IMMEDIATE MEDICAL CARE IF:   You have a fever.  You are leaking fluid from your vagina.  You have spotting or bleeding from your vagina.  You have severe abdominal cramping or pain.  You have rapid weight loss or gain.  You have shortness of breath with chest pain.  You notice sudden or extreme swelling   of your face, hands, ankles, feet, or legs.  You have not felt your baby move in over an hour.  You have severe headaches that do not go away with medicine.  You have vision changes. Document Released: 01/17/2001 Document Revised: 01/28/2013 Document Reviewed: 03/26/2012 Exit[employer] Patient Information 2015 [employer]. This information is not intended to replace advice given to you by your health care provider. Make sure you discuss any questions you have with your health care provider.  

## 2014-07-23 NOTE — Progress Notes (Signed)
Breastfeeding Tip of the week regarding feeding cues reviewed with patient.

## 2014-07-25 ENCOUNTER — Encounter (HOSPITAL_COMMUNITY): Payer: Self-pay | Admitting: *Deleted

## 2014-07-25 ENCOUNTER — Inpatient Hospital Stay (HOSPITAL_COMMUNITY)
Admission: AD | Admit: 2014-07-25 | Discharge: 2014-07-25 | Disposition: A | Discharge status: Home / Self Care | Payer: BLUE CROSS/BLUE SHIELD | Source: Ambulatory Visit | Attending: Obstetrics & Gynecology | Admitting: Obstetrics & Gynecology

## 2014-07-25 DIAGNOSIS — Z3493 Encounter for supervision of normal pregnancy, unspecified, third trimester: Secondary | ICD-10-CM | POA: Insufficient documentation

## 2014-07-25 DIAGNOSIS — Z3483 Encounter for supervision of other normal pregnancy, third trimester: Secondary | ICD-10-CM

## 2014-07-25 DIAGNOSIS — Z3A38 38 weeks gestation of pregnancy: Secondary | ICD-10-CM | POA: Diagnosis not present

## 2014-07-25 DIAGNOSIS — J45909 Unspecified asthma, uncomplicated: Secondary | ICD-10-CM

## 2014-07-25 DIAGNOSIS — O99519 Diseases of the respiratory system complicating pregnancy, unspecified trimester: Secondary | ICD-10-CM

## 2014-07-25 LAB — POCT FERN TEST: POCT Fern Test: NEGATIVE

## 2014-07-25 NOTE — Discharge Instructions (Signed)
Braxton Hicks Contractions °Contractions of the uterus can occur throughout pregnancy. Contractions are not always a sign that you are in labor.  °WHAT ARE BRAXTON HICKS CONTRACTIONS?  °Contractions that occur before labor are called Braxton Hicks contractions, or false labor. Toward the end of pregnancy (32-34 weeks), these contractions can develop more often and may become more forceful. This is not true labor because these contractions do not result in opening (dilatation) and thinning of the cervix. They are sometimes difficult to tell apart from true labor because these contractions can be forceful and people have different pain tolerances. You should not feel embarrassed if you go to the hospital with false labor. Sometimes, the only way to tell if you are in true labor is for your health care provider to look for changes in the cervix. °If there are no prenatal problems or other health problems associated with the pregnancy, it is completely safe to be sent home with false labor and await the onset of true labor. °HOW CAN YOU TELL THE DIFFERENCE BETWEEN TRUE AND FALSE LABOR? °False Labor °· The contractions of false labor are usually shorter and not as hard as those of true labor.   °· The contractions are usually irregular.   °· The contractions are often felt in the front of the lower abdomen and in the groin.   °· The contractions may go away when you walk around or change positions while lying down.   °· The contractions get weaker and are shorter lasting as time goes on.   °· The contractions do not usually become progressively stronger, regular, and closer together as with true labor.   °True Labor °1. Contractions in true labor last 30-70 seconds, become very regular, usually become more intense, and increase in frequency.   °2. The contractions do not go away with walking.   °3. The discomfort is usually felt in the top of the uterus and spreads to the lower abdomen and low back.   °4. True labor can  be determined by your health care provider with an exam. This will show that the cervix is dilating and getting thinner.   °WHAT TO REMEMBER °· Keep up with your usual exercises and follow other instructions given by your health care provider.   °· Take medicines as directed by your health care provider.   °· Keep your regular prenatal appointments.   °· Eat and drink lightly if you think you are going into labor.   °· If Braxton Hicks contractions are making you uncomfortable:   °· Change your position from lying down or resting to walking, or from walking to resting.   °· Sit and rest in a tub of warm water.   °· Drink 2-3 glasses of water. Dehydration may cause these contractions.   °· Do slow and deep breathing several times an hour.   °WHEN SHOULD I SEEK IMMEDIATE MEDICAL CARE? °Seek immediate medical care if: °· Your contractions become stronger, more regular, and closer together.   °· You have fluid leaking or gushing from your vagina.   °· You have a fever.   °· You pass blood-tinged mucus.   °· You have vaginal bleeding.   °· You have continuous abdominal pain.   °· You have low back pain that you never had before.   °· You feel your baby's head pushing down and causing pelvic pressure.   °· Your baby is not moving as much as it used to.   °Document Released: 01/23/2005 Document Revised: 01/28/2013 Document Reviewed: 11/04/2012 °ExitCare® Patient Information ©2015 [employer]. This information is not intended to replace advice given to you by your health care   provider. Make sure you discuss any questions you have with your health care provider. ° °Fetal Movement Counts °Patient Name: __________________________________________________ Patient Due Date: ____________________ °Performing a fetal movement count is highly recommended in high-risk pregnancies, but it is good for every pregnant woman to do. Your health care provider may ask you to start counting fetal movements at 28 weeks of the pregnancy. Fetal  movements often increase: °· After eating a full meal. °· After physical activity. °· After eating or drinking something sweet or cold. °· At rest. °Pay attention to when you feel the baby is most active. This will help you notice a pattern of your baby's sleep and wake cycles and what factors contribute to an increase in fetal movement. It is important to perform a fetal movement count at the same time each day when your baby is normally most active.  °HOW TO COUNT FETAL MOVEMENTS °5. Find a quiet and comfortable area to sit or lie down on your left side. Lying on your left side provides the best blood and oxygen circulation to your baby. °6. Write down the day and time on a sheet of paper or in a journal. °7. Start counting kicks, flutters, swishes, rolls, or jabs in a 2-hour period. You should feel at least 10 movements within 2 hours. °8. If you do not feel 10 movements in 2 hours, wait 2-3 hours and count again. Look for a change in the pattern or not enough counts in 2 hours. °SEEK MEDICAL CARE IF: °· You feel less than 10 counts in 2 hours, tried twice. °· There is no movement in over an hour. °· The pattern is changing or taking longer each day to reach 10 counts in 2 hours. °· You feel the baby is not moving as he or she usually does. °Date: ____________ Movements: ____________ Start time: ____________ Finish time: ____________  °Date: ____________ Movements: ____________ Start time: ____________ Finish time: ____________ °Date: ____________ Movements: ____________ Start time: ____________ Finish time: ____________ °Date: ____________ Movements: ____________ Start time: ____________ Finish time: ____________ °Date: ____________ Movements: ____________ Start time: ____________ Finish time: ____________ °Date: ____________ Movements: ____________ Start time: ____________ Finish time: ____________ °Date: ____________ Movements: ____________ Start time: ____________ Finish time: ____________ °Date: ____________  Movements: ____________ Start time: ____________ Finish time: ____________  °Date: ____________ Movements: ____________ Start time: ____________ Finish time: ____________ °Date: ____________ Movements: ____________ Start time: ____________ Finish time: ____________ °Date: ____________ Movements: ____________ Start time: ____________ Finish time: ____________ °Date: ____________ Movements: ____________ Start time: ____________ Finish time: ____________ °Date: ____________ Movements: ____________ Start time: ____________ Finish time: ____________ °Date: ____________ Movements: ____________ Start time: ____________ Finish time: ____________ °Date: ____________ Movements: ____________ Start time: ____________ Finish time: ____________  °Date: ____________ Movements: ____________ Start time: ____________ Finish time: ____________ °Date: ____________ Movements: ____________ Start time: ____________ Finish time: ____________ °Date: ____________ Movements: ____________ Start time: ____________ Finish time: ____________ °Date: ____________ Movements: ____________ Start time: ____________ Finish time: ____________ °Date: ____________ Movements: ____________ Start time: ____________ Finish time: ____________ °Date: ____________ Movements: ____________ Start time: ____________ Finish time: ____________ °Date: ____________ Movements: ____________ Start time: ____________ Finish time: ____________  °Date: ____________ Movements: ____________ Start time: ____________ Finish time: ____________ °Date: ____________ Movements: ____________ Start time: ____________ Finish time: ____________ °Date: ____________ Movements: ____________ Start time: ____________ Finish time: ____________ °Date: ____________ Movements: ____________ Start time: ____________ Finish time: ____________ °Date: ____________ Movements: ____________ Start time: ____________ Finish time: ____________ °Date: ____________ Movements: ____________ Start time:  ____________ Finish time: ____________ °Date: ____________ Movements:   ____________ Start time: ____________ Finish time: ____________  °Date: ____________ Movements: ____________ Start time: ____________ Finish time: ____________ °Date: ____________ Movements: ____________ Start time: ____________ Finish time: ____________ °Date: ____________ Movements: ____________ Start time: ____________ Finish time: ____________ °Date: ____________ Movements: ____________ Start time: ____________ Finish time: ____________ °Date: ____________ Movements: ____________ Start time: ____________ Finish time: ____________ °Date: ____________ Movements: ____________ Start time: ____________ Finish time: ____________ °Date: ____________ Movements: ____________ Start time: ____________ Finish time: ____________  °Date: ____________ Movements: ____________ Start time: ____________ Finish time: ____________ °Date: ____________ Movements: ____________ Start time: ____________ Finish time: ____________ °Date: ____________ Movements: ____________ Start time: ____________ Finish time: ____________ °Date: ____________ Movements: ____________ Start time: ____________ Finish time: ____________ °Date: ____________ Movements: ____________ Start time: ____________ Finish time: ____________ °Date: ____________ Movements: ____________ Start time: ____________ Finish time: ____________ °Date: ____________ Movements: ____________ Start time: ____________ Finish time: ____________  °Date: ____________ Movements: ____________ Start time: ____________ Finish time: ____________ °Date: ____________ Movements: ____________ Start time: ____________ Finish time: ____________ °Date: ____________ Movements: ____________ Start time: ____________ Finish time: ____________ °Date: ____________ Movements: ____________ Start time: ____________ Finish time: ____________ °Date: ____________ Movements: ____________ Start time: ____________ Finish time: ____________ °Date:  ____________ Movements: ____________ Start time: ____________ Finish time: ____________ °Date: ____________ Movements: ____________ Start time: ____________ Finish time: ____________  °Date: ____________ Movements: ____________ Start time: ____________ Finish time: ____________ °Date: ____________ Movements: ____________ Start time: ____________ Finish time: ____________ °Date: ____________ Movements: ____________ Start time: ____________ Finish time: ____________ °Date: ____________ Movements: ____________ Start time: ____________ Finish time: ____________ °Date: ____________ Movements: ____________ Start time: ____________ Finish time: ____________ °Date: ____________ Movements: ____________ Start time: ____________ Finish time: ____________ °Document Released: 02/22/2006 Document Revised: 06/09/2013 Document Reviewed: 11/20/2011 °ExitCare® Patient Information ©2015 [employer]. This information is not intended to replace advice given to you by your health care provider. Make sure you discuss any questions you have with your health care provider. ° °

## 2014-07-25 NOTE — MAU Note (Signed)
Pt states had her first gush of water 2000 last night and has been having small gushes and contractions since.  Denies vb.

## 2014-07-29 ENCOUNTER — Inpatient Hospital Stay (HOSPITAL_COMMUNITY)
Admission: AD | Admit: 2014-07-29 | Discharge: 2014-07-31 | DRG: 775 | Disposition: A | Discharge status: Home / Self Care | Payer: BLUE CROSS/BLUE SHIELD | Source: Ambulatory Visit | Attending: Obstetrics & Gynecology | Admitting: Obstetrics & Gynecology

## 2014-07-29 ENCOUNTER — Encounter: Payer: BLUE CROSS/BLUE SHIELD | Admitting: Obstetrics and Gynecology

## 2014-07-29 ENCOUNTER — Encounter (HOSPITAL_COMMUNITY): Payer: Self-pay | Admitting: *Deleted

## 2014-07-29 DIAGNOSIS — Z3483 Encounter for supervision of other normal pregnancy, third trimester: Secondary | ICD-10-CM | POA: Diagnosis present

## 2014-07-29 DIAGNOSIS — O99519 Diseases of the respiratory system complicating pregnancy, unspecified trimester: Secondary | ICD-10-CM

## 2014-07-29 DIAGNOSIS — Z3A39 39 weeks gestation of pregnancy: Secondary | ICD-10-CM | POA: Diagnosis present

## 2014-07-29 DIAGNOSIS — J45909 Unspecified asthma, uncomplicated: Secondary | ICD-10-CM

## 2014-07-29 DIAGNOSIS — IMO0001 Reserved for inherently not codable concepts without codable children: Secondary | ICD-10-CM

## 2014-07-29 LAB — TYPE AND SCREEN
ABO/RH(D): B POS
Antibody Screen: NEGATIVE

## 2014-07-29 LAB — CBC
HEMATOCRIT: 27.7 % — AB (ref 36.0–46.0)
HEMOGLOBIN: 9.1 g/dL — AB (ref 12.0–15.0)
MCH: 24.1 pg — AB (ref 26.0–34.0)
MCHC: 32.9 g/dL (ref 30.0–36.0)
MCV: 73.5 fL — AB (ref 78.0–100.0)
Platelets: 196 10*3/uL (ref 150–400)
RBC: 3.77 MIL/uL — ABNORMAL LOW (ref 3.87–5.11)
RDW: 14.2 % (ref 11.5–15.5)
WBC: 7.2 10*3/uL (ref 4.0–10.5)

## 2014-07-29 LAB — POCT FERN TEST: POCT FERN TEST: POSITIVE

## 2014-07-29 LAB — RPR: RPR Ser Ql: NONREACTIVE

## 2014-07-29 LAB — ABO/RH: ABO/RH(D): B POS

## 2014-07-29 LAB — HIV ANTIBODY (ROUTINE TESTING W REFLEX): HIV SCREEN 4TH GENERATION: NONREACTIVE

## 2014-07-29 MED ORDER — LACTATED RINGERS IV SOLN
INTRAVENOUS | Status: DC
  Administered 2014-07-29: 08:00:00 via INTRAVENOUS

## 2014-07-29 MED ORDER — CITRIC ACID-SODIUM CITRATE 334-500 MG/5ML PO SOLN: 30.0000 mL | ORAL | Status: DC | PRN

## 2014-07-29 MED ORDER — LANOLIN HYDROUS EX OINT: TOPICAL_OINTMENT | CUTANEOUS | Status: DC | PRN

## 2014-07-29 MED ORDER — OXYCODONE-ACETAMINOPHEN 5-325 MG PO TABS: 1.0000 | ORAL_TABLET | ORAL | Status: DC | PRN

## 2014-07-29 MED ORDER — IBUPROFEN 600 MG PO TABS
600.0000 mg | ORAL_TABLET | Freq: Four times a day (QID) | ORAL | Status: DC
  Administered 2014-07-29 – 2014-07-31 (×6): 600 mg via ORAL
  Filled 2014-07-29 (×7): qty 1

## 2014-07-29 MED ORDER — OXYTOCIN 40 UNITS IN LACTATED RINGERS INFUSION - SIMPLE MED
62.5000 mL/h | INTRAVENOUS | Status: DC
  Administered 2014-07-29: 62.5 mL/h via INTRAVENOUS
  Filled 2014-07-29: qty 1000

## 2014-07-29 MED ORDER — ONDANSETRON HCL 4 MG/2ML IJ SOLN: 4.0000 mg | INTRAMUSCULAR | Status: DC | PRN

## 2014-07-29 MED ORDER — SIMETHICONE 80 MG PO CHEW: 80.0000 mg | CHEWABLE_TABLET | ORAL | Status: DC | PRN

## 2014-07-29 MED ORDER — ZOLPIDEM TARTRATE 5 MG PO TABS: 5.0000 mg | ORAL_TABLET | Freq: Every evening | ORAL | Status: DC | PRN

## 2014-07-29 MED ORDER — SENNOSIDES-DOCUSATE SODIUM 8.6-50 MG PO TABS
2.0000 | ORAL_TABLET | ORAL | Status: DC
  Administered 2014-07-30: 2 via ORAL
  Filled 2014-07-29 (×2): qty 2

## 2014-07-29 MED ORDER — DIPHENHYDRAMINE HCL 25 MG PO CAPS: 25.0000 mg | ORAL_CAPSULE | Freq: Four times a day (QID) | ORAL | Status: DC | PRN

## 2014-07-29 MED ORDER — OXYCODONE-ACETAMINOPHEN 5-325 MG PO TABS: 2.0000 | ORAL_TABLET | ORAL | Status: DC | PRN

## 2014-07-29 MED ORDER — ACETAMINOPHEN 325 MG PO TABS: 650.0000 mg | ORAL_TABLET | ORAL | Status: DC | PRN

## 2014-07-29 MED ORDER — TETANUS-DIPHTH-ACELL PERTUSSIS 5-2.5-18.5 LF-MCG/0.5 IM SUSP: 0.5000 mL | Freq: Once | INTRAMUSCULAR | Status: DC

## 2014-07-29 MED ORDER — LIDOCAINE HCL (PF) 1 % IJ SOLN
30.0000 mL | INTRAMUSCULAR | Status: DC | PRN
  Filled 2014-07-29: qty 30

## 2014-07-29 MED ORDER — LACTATED RINGERS IV SOLN: 500.0000 mL | INTRAVENOUS | Status: DC | PRN

## 2014-07-29 MED ORDER — OXYTOCIN BOLUS FROM INFUSION: 500.0000 mL | INTRAVENOUS | Status: DC

## 2014-07-29 MED ORDER — ONDANSETRON HCL 4 MG/2ML IJ SOLN: 4.0000 mg | Freq: Four times a day (QID) | INTRAMUSCULAR | Status: DC | PRN

## 2014-07-29 MED ORDER — ONDANSETRON HCL 4 MG PO TABS: 4.0000 mg | ORAL_TABLET | ORAL | Status: DC | PRN

## 2014-07-29 MED ORDER — WITCH HAZEL-GLYCERIN EX PADS: 1.0000 "application " | MEDICATED_PAD | CUTANEOUS | Status: DC | PRN

## 2014-07-29 MED ORDER — ACETAMINOPHEN 325 MG PO TABS
650.0000 mg | ORAL_TABLET | ORAL | Status: DC | PRN
  Administered 2014-07-29: 650 mg via ORAL
  Filled 2014-07-29: qty 2

## 2014-07-29 MED ORDER — DIBUCAINE 1 % RE OINT: 1.0000 "application " | TOPICAL_OINTMENT | RECTAL | Status: DC | PRN

## 2014-07-29 MED ORDER — PRENATAL MULTIVITAMIN CH
1.0000 | ORAL_TABLET | Freq: Every day | ORAL | Status: DC
  Administered 2014-07-29 – 2014-07-30 (×2): 1 via ORAL
  Filled 2014-07-29 (×2): qty 1

## 2014-07-29 MED ORDER — FENTANYL CITRATE (PF) 100 MCG/2ML IJ SOLN
50.0000 ug | INTRAMUSCULAR | Status: DC | PRN
  Administered 2014-07-29: 50 ug via INTRAVENOUS
  Administered 2014-07-29: 100 ug via INTRAVENOUS
  Filled 2014-07-29 (×2): qty 2

## 2014-07-29 MED ORDER — BENZOCAINE-MENTHOL 20-0.5 % EX AERO
1.0000 "application " | INHALATION_SPRAY | CUTANEOUS | Status: DC | PRN
  Administered 2014-07-30: 1 via TOPICAL
  Filled 2014-07-29: qty 56

## 2014-07-29 NOTE — H&P (Signed)
LABOR ADMISSION HISTORY AND PHYSICAL  Carrie [name] is a 24 y.o. female G3P1011 with IUP at [redacted]w[redacted]d by LMP presenting for SOL. She reports +FM, + contractions, +SROM @0630 , no VB, no blurry vision, headaches or peripheral edema, and RUQ pain.  She plans on breast feeding. She request IUD for birth control.   Dating: By LMP c/w 5wk US--->  Estimated Date of Delivery: 08/03/14  Sono:   @[redacted]w[redacted]d , CWD, normal anatomy, cephalic presentation, 1271g, 41% EFW, polyhydramnios (resolved on 6/16 [place])   Prenatal History/Complications: -polyhydramnios that resolved [facility] (transferred from [facility])  Prenatal Labs  Dating LMP c/w 5 week ultrasound Blood type: B/Positive/-- (11/16 0000)   Genetic Screen 1 Screen and AFP negative at [facility] Antibody:Negative (11/16 0000)  Anatomic [place] normal except for polyhydramnios which resolved Rubella: Immune (11/16 0000)  GTT Third trimester: 94 RPR: Nonreactive (11/16 0000)   Flu vaccine 10/15 HBsAg: Negative (11/16 0000)   TDaP vaccine  04/21/14  HIV: Non-reactive (11/16 0000)   GBS negative  GBS: negative  Contraception IUD - Mirena Pap: LGSIL (06/2013)  Baby Food breast   Circumcision n/a girl   Pediatrician [facility]   Support Person Husband       Past Medical History: Past Medical History  Diagnosis Date  . Asthma     Past Surgical History: Past Surgical History  Procedure Laterality Date  . Tonsillectomy      Obstetrical History: OB History    Gravida Para Term Preterm AB TAB SAB Ectopic Multiple Living   3 1 1  1 1    1       Social History: History   Social History  . Marital Status: Married    Spouse Name: N/A  . Number of Children: N/A  . Years of Education: N/A   Social History Main Topics  . Smoking status: Never Smoker   . Smokeless tobacco: Never Used  . Alcohol Use: No  . Drug Use: No  . Sexual Activity: Not  Currently    Birth Control/ Protection: None   Other Topics Concern  . None   Social History Narrative    Family History: History reviewed. No pertinent family history.  Allergies: No Known Allergies  Prescriptions prior to admission  Medication Sig Dispense Refill Last Dose  . albuterol (PROVENTIL HFA;VENTOLIN HFA) 108 (90 BASE) MCG/ACT inhaler Inhale 1 puff into the lungs every 6 (six) hours as needed for wheezing or shortness of breath.   emergency  . beclomethasone (QVAR) 40 MCG/ACT inhaler Inhale 1 puff into the lungs 2 (two) times daily. 1 Inhaler 12 07/27/2014    Review of Systems  All systems reviewed and negative except as stated in HPI  BP 123/74 mmHg  Pulse 87  Temp(Src) 97.6 F (36.4 C) (Oral)  Resp 18  Ht 5\' 1"  (1.549 m)  Wt 173 lb (78.472 kg)  BMI 32.70 kg/m2  LMP 10/27/2013 General appearance: alert, cooperative and mild distress Lungs: clear to auscultation bilaterally Heart: regular rate and rhythm Abdomen: soft, non-tender; bowel sounds normal Extremities: Homans sign is negative, no sign of DVT, edema DTR's present Presentation: cephalic Fetal monitoringBaseline: 140 bpm, Variability: Good {> 6 bpm), Accelerations: Reactive and Decelerations: Absent Uterine activityFrequency: Every 3-4 minutes Pelvic: Dilation: 4.5 Effacement (%): 70, 80 Station: -2 Exam by:: Sarajane Marek, RNC   Prenatal labs: ABO, Rh: --/--/B POS (06/22 0805) Antibody: NEG (06/22 0805) Rubella:  Immune RPR: NON REAC (03/23 1504)  HBsAg: Negative (11/16 0000)  HIV: NONREACTIVE (03/23  1504)  GBS: Negative (05/26 0000)  1 hr Glucola 94 Genetic screening negative Anatomy US normal  Prenatal Transfer Tool  Maternal Diabetes: No Genetic Screening: Normal Maternal Ultrasounds/Referrals: Normal Fetal Ultrasounds or other Referrals:  None Maternal Substance Abuse:  No Significant Maternal Medications:  None Significant Maternal Lab Results: Lab values include: Group B Strep  negative  Results for orders placed or performed during the hospital encounter of 07/29/14 (from the past 24 hour(s))  CBC   Collection Time: 07/29/14  8:05 AM  Result Value Ref Range   WBC 7.2 4.0 - 10.5 K/uL   RBC 3.77 (L) 3.87 - 5.11 MIL/uL   Hemoglobin 9.1 (L) 12.0 - 15.0 g/dL   HCT 16.1 (L) 09.6 - 04.5 %   MCV 73.5 (L) 78.0 - 100.0 fL   MCH 24.1 (L) 26.0 - 34.0 pg   MCHC 32.9 30.0 - 36.0 g/dL   RDW 40.9 81.1 - 91.4 %   Platelets 196 150 - 400 K/uL  Type and screen   Collection Time: 07/29/14  8:05 AM  Result Value Ref Range   ABO/RH(D) B POS    Antibody Screen NEG    Sample Expiration 08/01/2014   Fern Test   Collection Time: 07/29/14  8:33 AM  Result Value Ref Range   POCT Fern Test Positive = ruptured amniotic membanes     Patient Active Problem List   Diagnosis Date Noted  . Active labor 07/29/2014  . Encounter for supervision of normal pregnancy in third trimester 04/29/2014  . Asthma affecting pregnancy, antepartum 04/29/2014    Assessment: Carrie [name] is a 24 y.o. G3P1011 at [redacted]w[redacted]d here for SOL with SROM.   #Labor: Expectant management. If needed will augment with Pitocin. #Pain: Labor support with IV medications; patient not wanting epidural #FWB: Cat 1 #ID: GBS neg #MOF: Breast #MOC: IUD #Circ: N/A, female  Caryl Ada, DO 07/29/2014, 9:25 AM PGY-1, [facility]

## 2014-07-29 NOTE — MAU Note (Signed)
States water broke at 0630, having contractions.

## 2014-07-30 MED ORDER — IBUPROFEN 600 MG PO TABS: 600.0000 mg | ORAL_TABLET | Freq: Four times a day (QID) | ORAL | Status: DC

## 2014-07-30 MED ORDER — DOCUSATE SODIUM 100 MG PO CAPS: 100.0000 mg | ORAL_CAPSULE | Freq: Two times a day (BID) | ORAL | Status: DC | PRN

## 2014-07-30 NOTE — Lactation Note (Signed)
This note was copied from the chart of Carrie [name]. Lactation Consultation Note  Patient Name: Carrie [name] [id number] Date: 07/30/2014 Reason for consult: Follow-up assessment  Baby is 50 1/2 hours old - 3% weight loss, 7-3.2 oz , 10 - 20 mins ( one 30 mins ),  And attempts. 5 wets ( includes 2 parents changed, 2 stools, ). Last serum bili - 6.1  LC reviewed BF -  basics with mom and dad. Baby woke up while LC present , parents  Changed  small wet diaper. LC placed baby skin to skin , and mom preferred to work on  Latching on the right breast in cross cradle position. LC assisted , baby on and off , sluggish latch. LC recommended changing positions and re - latched in football with depth , less sluggish , but still Needing intermittent stimulation and then the feeding pattern is sluggish with a few swallows , increased With breast compressions. Baby fell asleep on the breast . And LC had mom release suction. Nipple appeared  normal shape. With hand expressing mom has a good flow of colostrum. LC had mom apply colostrum on nipple after feeding. LC left the baby skin to skin with mom. LC recommended and encouraged mom to call on nurses light for a feeding assessment  When the baby is showing feeding feeding cues.    Maternal Data Has patient been taught Hand Expression?: Yes  Feeding Feeding Type: Breast Fed (switched position to football right breast ) Length of feed: 7 min (swallows noted , latched with sluggish pattern )  LATCH Score/Interventions Latch: Grasps breast easily, tongue down, lips flanged, rhythmical sucking. Intervention(s): Adjust position;Assist with latch;Breast massage;Breast compression  Audible Swallowing: A few with stimulation Intervention(s): Skin to skin;Hand expression;Alternate breast massage  Type of Nipple: Everted at rest and after stimulation  Comfort (Breast/Nipple): Soft / non-tender     Hold (Positioning): Assistance needed to  correctly position infant at breast and maintain latch. (worked on depth and positioning ) Intervention(s): Breastfeeding basics reviewed;Support Pillows;Position options;Skin to skin  LATCH Score: 8  Lactation Tools Discussed/Used Tools: Pump Breast pump type: Manual (given by staff )   Consult Status Consult Status: Follow-up Date: 07/30/14 Follow-up type: In-patient    Kathrin Greathouse 07/30/2014, 2:57 PM

## 2014-07-30 NOTE — Progress Notes (Signed)
Patient ID: Carrie [name], female   DOB: 1990-10-03, 24 y.o.   MRN: [id number]   PPD#1, SVD  Baby will not be discharged today. Mom would like to cancel D/C.  [place], [place] 07/30/2014 3:39 PM

## 2014-07-30 NOTE — Lactation Note (Signed)
This note was copied from the chart of Carrie [name]. Lactation Consultation Note Mom has a 6 yr. Old but didn't BF that child. Mom concerned if baby was getting anything from breast. Hand expression done w/easy flow of colostrum. Mom has good everted nipples. Needed assistance w/latching and positioning baby.  Mom encouraged to feed baby 8-12 times/24 hours and with feeding cues. Educated about newborn behavior, supply and demand, I&O. Referred to Baby and Me Book in Breastfeeding section Pg. 22-23 for position options and Proper latch demonstration.Mom encouraged to do skin-to-skin.Encouraged to call for assistance if needed and to verify proper latch.WH/LC brochure given w/resources, support groups and LC services. Patient Name: Carrie [name] OVANV'B Date: 07/30/2014 Reason for consult: Initial assessment   Maternal Data Has patient been taught Hand Expression?: Yes Does the patient have breastfeeding experience prior to this delivery?: No  Feeding Feeding Type: Breast Fed  LATCH Score/Interventions Latch: Repeated attempts needed to sustain latch, nipple held in mouth throughout feeding, stimulation needed to elicit sucking reflex. Intervention(s): Adjust position;Assist with latch;Breast massage;Breast compression  Audible Swallowing: A few with stimulation Intervention(s): Skin to skin;Hand expression Intervention(s): Skin to skin;Hand expression;Alternate breast massage  Type of Nipple: Everted at rest and after stimulation  Comfort (Breast/Nipple): Filling, red/small blisters or bruises, mild/mod discomfort  Problem noted: Mild/Moderate discomfort Interventions (Mild/moderate discomfort): Hand massage;Hand expression  Hold (Positioning): Assistance needed to correctly position infant at breast and maintain latch. Intervention(s): Breastfeeding basics reviewed;Support Pillows;Position options;Skin to skin  LATCH Score: 6  Lactation Tools Discussed/Used Tools:  Pump Breast pump type: Manual Pump Review: Setup, frequency, and cleaning;Milk Storage Initiated by:: RN Date initiated:: 07/29/14   Consult Status Consult Status: Follow-up Date: 07/31/14 Follow-up type: In-patient    Charyl Dancer 07/30/2014, 6:45 AM

## 2014-07-30 NOTE — Discharge Summary (Signed)
Obstetric Discharge Summary Reason for Admission: onset of labor Prenatal Procedures: ultrasound Intrapartum Procedures: spontaneous vaginal delivery Postpartum Procedures: none Complications-Operative and Postpartum: none  Delivery Summary: Patient is 24 y.o. [id number] [redacted]w[redacted]d admitted for SOL. At 11:41 AM a viable female was delivered via Vaginal, Spontaneous Delivery (Presentation: Left Occiput Anterior). APGAR: 9, 9; weight 7 lb 6.7 oz (3365 g). Placenta status: Intact, Spontaneous. Cord: 3 vessels with the following complications: None. Cord pH: none.  Anesthesia: None  Episiotomy: None Lacerations: None Est. Blood Loss (mL): 130  Hospital Course:  Active Problems:   Active labor   Carrie [name] is a 24 y.o. [id number] s/p SVD.  Patient was admitted for active labor.  She has postpartum course that was uncomplicated including no problems with ambulating, PO intake, urination, pain, or bleeding. The pt feels ready to go home and  will be discharged with outpatient follow-up.   Today: No acute events overnight.  Pt denies problems with ambulating, voiding or po intake.  She denies nausea or vomiting.  Pain is well controlled.  She has had flatus. She has not had bowel movement.  Lochia Small.  Plan for birth control is  IUD.  Method of Feeding: Breast.  Physical Exam:  General: alert, cooperative and no distress Lochia: appropriate Uterine Fundus: firm DVT Evaluation: No evidence of DVT seen on physical exam.  H/H: Lab Results  Component Value Date/Time   HGB 9.1* 07/29/2014 08:05 AM   HGB 10.9* 03/16/2014 08:06 AM   HGB 12.1 12/22/2013   HCT 27.7* 07/29/2014 08:05 AM   HCT 33.0* 03/16/2014 08:06 AM   HCT 36 12/22/2013    Discharge Diagnoses: Term Pregnancy-delivered  Discharge Information: Date: 07/30/2014 Activity: pelvic rest Diet: routine  Medications: PNV, Ibuprofen and Colace Breast feeding:  Yes Condition: stable Instructions: refer to handout Discharge to:  home      Medication List    TAKE these medications        albuterol 108 (90 BASE) MCG/ACT inhaler  Commonly known as:  PROVENTIL HFA;VENTOLIN HFA  Inhale 1 puff into the lungs every 6 (six) hours as needed for wheezing or shortness of breath.     beclomethasone 40 MCG/ACT inhaler  Commonly known as:  QVAR  Inhale 1 puff into the lungs 2 (two) times daily.     docusate sodium 100 MG capsule  Commonly known as:  COLACE  Take 1 capsule (100 mg total) by mouth 2 (two) times daily as needed for mild constipation.     ibuprofen 600 MG tablet  Commonly known as:  ADVIL,MOTRIN  Take 1 tablet (600 mg total) by mouth every 6 (six) hours.       Follow-up Information    Schedule an appointment as soon as possible for a visit with [facility].   Specialty:  Obstetrics and Gynecology   Why:  for post-partum visit   Contact information:   [address] [place] [address] [phone]     Caryl Ada, DO 07/30/2014, 10:51 AM PGY-1, [facility]  I was present for the exam and agree with above.  Williams, CNM 07/30/2014 11:35 AM

## 2014-07-30 NOTE — Lactation Note (Signed)
This note was copied from the chart of Carrie Anyjah Headen. Lactation Consultation Note  Mother asked about meconium diaper upon entering.  Provided education. Reviewed hand expression.  Drops expressed. Mother latched baby in football hold.  Sucks and some swallows observed. Encouraged her to compress breast during feeding to keep baby active. Suggest mother breastfeed on R side and then burp and switch to L side. Reviewed pp 20-24 in Baby & Me booklet.  Patient Name: Carrie [name] [id number] Date: 07/30/2014 Reason for consult: Follow-up assessment   Maternal Data Has patient been taught Hand Expression?: Yes  Feeding Feeding Type: Breast Fed Length of feed: 28 min  LATCH Score/Interventions Latch: Grasps breast easily, tongue down, lips flanged, rhythmical sucking. Intervention(s): Adjust position;Assist with latch;Breast massage;Breast compression  Audible Swallowing: A few with stimulation Intervention(s): Skin to skin;Hand expression;Alternate breast massage  Type of Nipple: Everted at rest and after stimulation  Comfort (Breast/Nipple): Soft / non-tender     Hold (Positioning): Assistance needed to correctly position infant at breast and maintain latch. Intervention(s): Breastfeeding basics reviewed;Support Pillows;Position options;Skin to skin  LATCH Score: 8  Lactation Tools Discussed/Used Tools: Pump Breast pump type: Manual (given by staff )   Consult Status Consult Status: Follow-up Date: 07/31/14 Follow-up type: In-patient    [name] 07/30/2014, 6:03 PM

## 2014-07-30 NOTE — Discharge Instructions (Signed)

## 2014-07-31 NOTE — Discharge Summary (Signed)
Obstetric Discharge Summary Reason for Admission: onset of labor Prenatal Procedures: ultrasound Intrapartum Procedures: spontaneous vaginal delivery Postpartum Procedures: none Complications-Operative and Postpartum: none  Delivery Summary: Patient is 24 y.o. P6P9509 [redacted]w[redacted]d admitted for SOL. At 11:41 AM a viable female was delivered via Vaginal, Spontaneous Delivery (Presentation: Left Occiput Anterior). APGAR: 9, 9; weight 7 lb 6.7 oz (3365 g). Placenta status: Intact, Spontaneous. Cord: 3 vessels with the following complications: None. Cord pH: none.  Anesthesia: None  Episiotomy: None Lacerations: None Est. Blood Loss (mL): 130  Hospital Course:  Active Problems:   Active labor   Ariba Dejarnette is a 24 y.o. T2I7124 s/p SVD.  Patient was admitted for active labor.  She has postpartum course that was uncomplicated including no problems with ambulating, PO intake, urination, pain, or bleeding. She was discharged yesterday but the order was reveresed due to pt not ready to discharge due to newborn not being ready. The pt feels ready to go home today and will be discharged with outpatient follow-up.   Today: No acute events overnight.  Pt denies problems with ambulating, voiding or po intake.  She denies nausea or vomiting.  Pain is well controlled.  She has had flatus. She has had bowel movement.  Lochia Small.  Plan for birth control is  IUD.  Method of Feeding: Breast.  Physical Exam:  BP 111/66 mmHg  Pulse 89  Temp(Src) 98.6 F (37 C) (Oral)  Resp 20  Ht 5\' 1"  (1.549 m)  Wt 173 lb (78.472 kg)  BMI 32.70 kg/m2  SpO2 100%  LMP 10/27/2013  Breastfeeding? Unknown  General: alert, cooperative and no distress Lochia: appropriate Uterine Fundus: firm DVT Evaluation: No evidence of DVT seen on physical exam.  H/H: Lab Results  Component Value Date/Time   HGB 9.1* 07/29/2014 08:05 AM   HGB 10.9* 03/16/2014 08:06 AM   HGB 12.1 12/22/2013   HCT 27.7* 07/29/2014 08:05 AM    HCT 33.0* 03/16/2014 08:06 AM   HCT 36 12/22/2013    Discharge Diagnoses: Term Pregnancy-delivered  Discharge Information: Date: 07/31/2014 Activity: pelvic rest Diet: routine  Medications: PNV, Ibuprofen and Colace Breast feeding:  Yes Condition: stable Instructions: refer to handout Discharge to: home       Discharge Instructions    Increase activity slowly    Complete by:  As directed             Medication List    TAKE these medications        albuterol 108 (90 BASE) MCG/ACT inhaler  Commonly known as:  PROVENTIL HFA;VENTOLIN HFA  Inhale 1 puff into the lungs every 6 (six) hours as needed for wheezing or shortness of breath.     beclomethasone 40 MCG/ACT inhaler  Commonly known as:  QVAR  Inhale 1 puff into the lungs 2 (two) times daily.     docusate sodium 100 MG capsule  Commonly known as:  COLACE  Take 1 capsule (100 mg total) by mouth 2 (two) times daily as needed for mild constipation.     ibuprofen 600 MG tablet  Commonly known as:  ADVIL,MOTRIN  Take 1 tablet (600 mg total) by mouth every 6 (six) hours.       Follow-up Information    Schedule an appointment as soon as possible for a visit with [facility].   Specialty:  Obstetrics and Gynecology   Why:  for post-partum visit   Contact information:   [address] [place] [address] [phone]  Caryl Ada, DO 07/31/2014, 8:59 AM PGY-1, [facility]  CNM attestation I have seen and examined this patient and agree with above documentation in the resident's note.   Saje Gallop is a 24 y.o. [id number] s/p SVD.   Pain is well controlled.  Plan for birth control is IUD.  Method of Feeding: breast  PE:  BP 111/66 mmHg  Pulse 89  Temp(Src) 98.6 F (37 C) (Oral)  Resp 20  Ht  (1.549 m)  Wt 78.472 kg (173 lb)  BMI 32.70 kg/m2  SpO2 100%  LMP 10/27/2013  Breastfeeding? Unknown Fundus firm   Recent Labs  07/29/14 0805  HGB  9.1*  HCT 27.7*     Plan: discharge today - postpartum care discussed - f/u clinic in 6 weeks for postpartum visit   SHAW, KIMBERLY, CNM 10:35 AM

## 2014-07-31 NOTE — Lactation Note (Signed)
This note was copied from the chart of Carrie Darshay Headen. Lactation Consultation Note  Mother hand expressed good flow prior to latching. Baby latched in football hold.  Encouraged her to compress breast to keep baby active. Sucks and swallows observed.  LS9. Mom encouraged to feed baby 8-12 times/24 hours and with feeding cues.  Reminded mother to keep track of feedings, voids/stools. Discussed engorgement care.   Patient Name: Carrie [name] [id number] Date: 07/31/2014 Reason for consult: Follow-up assessment   Maternal Data    Feeding Feeding Type: Breast Fed Length of feed: 30 min  LATCH Score/Interventions Latch: Grasps breast easily, tongue down, lips flanged, rhythmical sucking. Intervention(s): Breast massage  Audible Swallowing: Spontaneous and intermittent  Type of Nipple: Everted at rest and after stimulation  Comfort (Breast/Nipple): Soft / non-tender     Hold (Positioning): Assistance needed to correctly position infant at breast and maintain latch.  LATCH Score: 9  Lactation Tools Discussed/Used     Consult Status Consult Status: Complete    Hardie Pulley 07/31/2014, 8:41 AM

## 2014-08-12 NOTE — H&P (Signed)
LABOR ADMISSION HISTORY AND PHYSICAL  Carrie [name] is a 24 y.o. female G3P1011 with IUP at [redacted]w[redacted]d by LMP presenting for SOL. She reports +FM, + contractions, +SROM , no VB, no blurry vision, headaches or peripheral edema, and RUQ [name].  She plans on breast feeding. She request IUD for birth control.   Dating: By LMP c/w 5wk US--->  Estimated Date of Delivery: 08/03/14  Sono:   , CWD, normal anatomy, cephalic presentation, 1271g, 78% EFW, polyhydramnios (resolved on 6/16 [place])   Prenatal History/Complications: -polyhydramnios that resolved [facility] (transferred from [facility])  Prenatal Labs  Dating LMP c/w 5 week ultrasound Blood type: B/Positive/-- (11/16 0000)   Genetic Screen 1 Screen and AFP negative at [facility] Antibody:Negative (11/16 0000)  Anatomic [place] normal except for polyhydramnios which resolved Rubella: Immune (11/16 0000)  GTT Third trimester: 94 RPR: Nonreactive (11/16 0000)   Flu vaccine 10/15 HBsAg: Negative (11/16 0000)   TDaP vaccine  04/21/14  HIV: Non-reactive (11/16 0000)   GBS negative  GBS: negative  Contraception IUD - Mirena Pap: LGSIL (06/2013)  Baby Food breast   Circumcision n/a girl   Pediatrician [facility]   Support Person Husband       Past Medical History: Past Medical History  Diagnosis Date  . Asthma     Past Surgical History: Past Surgical History  Procedure Laterality Date  . Tonsillectomy      Obstetrical History: OB History    Gravida Para Term Preterm AB TAB SAB Ectopic Multiple Living   0 2      Social History: History   Social History  . Marital Status: Married    Spouse Name: N/A  . Number of Children: N/A  . Years of Education: N/A   Social History Main Topics  . Smoking status: Never Smoker   . Smokeless tobacco: Never Used  . Alcohol Use: No  . Drug Use: No  . Sexual Activity: Not  Currently    Birth Control/ Protection: None   Other Topics Concern  . None   Social History Narrative    Family History: History reviewed. No pertinent family history.  Allergies: No Known Allergies  No prescriptions prior to admission    Review of Systems  All systems reviewed and negative except as stated in HPI  BP 111/66 mmHg  Pulse 89  Temp(Src) 98.6 F (37 C) (Oral)  Resp 20  Ht  (1.549 m)  Wt 173 lb (78.472 kg)  BMI 32.70 kg/m2  SpO2 100%  LMP 10/27/2013  Breastfeeding? Unknown General appearance: alert, cooperative and mild distress Lungs: clear to auscultation bilaterally Heart: regular rate and rhythm Abdomen: soft, non-tender; bowel sounds normal Extremities: Homans sign is negative, no sign of DVT, edema DTR's present Presentation: cephalic Fetal monitoringBaseline: 140 bpm, Variability: Good {> 6 bpm), Accelerations: Reactive and Decelerations: Absent Uterine activityFrequency: Every 3-4 minutes Pelvic: Dilation: 10 Effacement (%): 100 Station: 0 Exam by:: Carrie Gins, RN   Prenatal labs: ABO, Rh: --/--/B POS, B POS (06/22 0805) Antibody: NEG (06/22 0805) Rubella:  Immune RPR: Non Reactive (06/22 0805)  HBsAg: Negative (11/16 0000)  HIV: NONREACTIVE (03/23 1504)  GBS: Negative (05/26 0000)  1 hr Glucola 94 Genetic screening negative Anatomy US normal  Prenatal Transfer Tool  Maternal Diabetes: No Genetic Screening: Normal Maternal Ultrasounds/Referrals: Normal Fetal Ultrasounds or other Referrals:  None Maternal Substance Abuse:  No Significant Maternal Medications:  None Significant Maternal Lab Results:  Lab values include: Group B Strep negative  No results found for this or any previous visit (from the past 24 hour(s)).  Patient Active Problem List   Diagnosis Date Noted  . Active labor 07/29/2014  . Encounter for supervision of normal pregnancy in third trimester 04/29/2014  . Asthma affecting pregnancy, antepartum  04/29/2014    Assessment: Carrie PainKristen [name] is a 24 y.o. G3P1011 at 2959w2d here for SOL with SROM.   #Labor: Expectant management. If needed will augment with Pitocin. #[name]: Labor support with IV medications; patient not wanting epidural #FWB: Cat 1 #ID: GBS neg #MOF: Breast #MOC: IUD #Circ: N/A, female  Caryl AdaJazma Phelps, DO 08/12/2014, 2:35 PM PGY-1, [facility]

## 2014-09-02 ENCOUNTER — Ambulatory Visit (INDEPENDENT_AMBULATORY_CARE_PROVIDER_SITE_OTHER): Payer: BLUE CROSS/BLUE SHIELD | Admitting: Obstetrics & Gynecology

## 2014-09-02 ENCOUNTER — Encounter: Payer: Self-pay | Admitting: Obstetrics & Gynecology

## 2014-09-02 VITALS — BP 115/69 | HR 82 | Temp 98.2°F | Ht 61.0 in | Wt 159.2 lb

## 2014-09-02 DIAGNOSIS — Z01419 Encounter for gynecological examination (general) (routine) without abnormal findings: Secondary | ICD-10-CM | POA: Diagnosis not present

## 2014-09-02 DIAGNOSIS — Z3483 Encounter for supervision of other normal pregnancy, third trimester: Secondary | ICD-10-CM

## 2014-09-02 LAB — POCT PREGNANCY, URINE: PREG TEST UR: NEGATIVE

## 2014-09-02 MED ORDER — NORETHINDRONE 0.35 MG PO TABS: 1.0000 | ORAL_TABLET | Freq: Every day | ORAL | Status: DC

## 2014-09-02 MED ORDER — ETONOGESTREL-ETHINYL ESTRADIOL 0.12-0.015 MG/24HR VA RING: VAGINAL_RING | VAGINAL | Status: DC

## 2014-09-02 NOTE — Progress Notes (Signed)
Patient ID: Carrie [name], female   DOB: [birth year], 24 y.o.   MRN: [id number] Subjective:     Carrie [name] is a 24 y.o. female who presents for a postpartum visit. She is 5 weeks postpartum following a spontaneous vaginal delivery. I have fully reviewed the prenatal and intrapartum course. The delivery was at [redacted]w[redacted]d gestational weeks. Outcome: spontaneous vaginal delivery. Anesthesia: none. Postpartum course has been uncomplicated. Baby's course has been uncomplicated. Baby is feeding by breast. Bleeding no bleeding. Bowel function is normal. Bladder function is normal. Patient is not sexually active. Contraception method is NuvaRing vaginal inserts. Postpartum depression screening: negative.  The following portions of the patient's history were reviewed and updated as appropriate: allergies, current medications, past family history, past medical history, past social history, past surgical history and problem list.  Review of Systems Pertinent items are noted in HPI.   Objective:    There were no vitals taken for this visit.  General:  alert, cooperative, appears stated age and no distress   Breasts:  negative  Lungs: clear to auscultation bilaterally, normal WOB  Heart:  normal rate, palpable pulses  Abdomen: soft, non-tender; bowel sounds normal; no masses,  no organomegaly   Vulva:  not evaluated  Vagina: not evaluated  Cervix:  no examined  Corpus: not examined  Adnexa:  not evaluated  Rectal Exam: Not performed.        Assessment:     5 weeks postpartum exam. Pap smear not done at today's visit.   Plan:    1. Contraception: oral progesterone-only contraceptive and Nuvaring sent in to start when baby is 6 months.  Patient is exclusively breast feeding. 2. Follow up in: 1 year or as needed.    Ashly M. Nadine Counts, DO PGY-2, [facility]

## 2014-09-02 NOTE — Patient Instructions (Addendum)
Your prescription has been sent into your pharmacy for both Micronor and Nuvaring.  Use Micronor while exclusively breast feeding.  When baby is 66 months old, and is taking solids, you can use Nuvaring instead of Micronor.  Should you decide that you would like an IUD, please schedule an appointment for placement.  You may resume normal activities.  Ethinyl Estradiol; Etonogestrel vaginal ring What is this medicine? ETHINYL ESTRADIOL; ETONOGESTREL (ETH in il es tra DYE ole; et oh noe JES trel) vaginal ring is a flexible, vaginal ring used as a contraceptive (birth control method). This medicine combines two types of female hormones, an estrogen and a progestin. This ring is used to prevent ovulation and pregnancy. Each ring is effective for one month. This medicine may be used for other purposes; ask your health care provider or pharmacist if you have questions. COMMON BRAND NAME(S): NuvaRing What should I tell my health care provider before I take this medicine? They need to know if you have or ever had any of these conditions: -abnormal vaginal bleeding -blood vessel disease or blood clots -breast, cervical, endometrial, ovarian, liver, or uterine cancer -diabetes -gallbladder disease -heart disease or recent heart attack -high blood pressure -high cholesterol -kidney disease -liver disease -migraine headaches -stroke -systemic lupus erythematosus (SLE) -tobacco smoker -an unusual or allergic reaction to estrogens, progestins, other medicines, foods, dyes, or preservatives -pregnant or trying to get pregnant -breast-feeding How should I use this medicine? Insert the ring into your vagina as directed. Follow the directions on the prescription label. The ring will remain place for 3 weeks and is then removed for a 1-week break. A new ring is inserted 1 week after the last ring was removed, on the same day of the week. Do not use more often than directed. A patient package insert for the  product will be given with each prescription and refill. Read this sheet carefully each time. The sheet may change frequently. Contact your pediatrician regarding the use of this medicine in children. Special care may be needed. This medicine has been used in female children who have started having menstrual periods. Overdosage: If you think you have taken too much of this medicine contact a poison control center or emergency room at once. NOTE: This medicine is only for you. Do not share this medicine with others. What if I miss a dose? You will need to replace your vaginal ring once a month as directed. If the ring should slip out, or if you leave it in longer or shorter than you should, contact your health care professional for advice. What may interact with this medicine? -acetaminophen -antibiotics or medicines for infections, especially rifampin, rifabutin, rifapentine, and griseofulvin, and possibly penicillins or tetracyclines -aprepitant -ascorbic acid (vitamin C) -atorvastatin -barbiturate medicines, such as phenobarbital -bosentan -carbamazepine -caffeine -clofibrate -cyclosporine -dantrolene -doxercalciferol -felbamate -grapefruit juice -hydrocortisone -medicines for anxiety or sleeping problems, such as diazepam or temazepam -medicines for diabetes, including pioglitazone -modafinil -mycophenolate -nefazodone -oxcarbazepine -phenytoin -prednisolone -ritonavir or other medicines for HIV infection or AIDS -rosuvastatin -selegiline -soy isoflavones supplements -St. John's wort -tamoxifen or raloxifene -theophylline -thyroid hormones -topiramate -warfarin This list may not describe all possible interactions. Give your health care provider a list of all the medicines, herbs, non-prescription drugs, or dietary supplements you use. Also tell them if you smoke, drink alcohol, or use illegal drugs. Some items may interact with your medicine. What should I watch for while  using this medicine? Visit your doctor or health care professional for  regular checks on your progress. You will need a regular breast and pelvic exam and Pap smear while on this medicine. Use an additional method of contraception during the first cycle that you use this ring. If you have any reason to think you are pregnant, stop using this medicine right away and contact your doctor or health care professional. If you are using this medicine for hormone related problems, it may take several cycles of use to see improvement in your condition. Smoking increases the risk of getting a blood clot or having a stroke while you are using hormonal birth control, especially if you are more than 24 years old. You are strongly advised not to smoke. This medicine can make your body retain fluid, making your fingers, hands, or ankles swell. Your blood pressure can go up. Contact your doctor or health care professional if you feel you are retaining fluid. This medicine can make you more sensitive to the sun. Keep out of the sun. If you cannot avoid being in the sun, wear protective clothing and use sunscreen. Do not use sun lamps or tanning beds/booths. If you wear contact lenses and notice visual changes, or if the lenses begin to feel uncomfortable, consult your eye care specialist. In some women, tenderness, swelling, or minor bleeding of the gums may occur. Notify your dentist if this happens. Brushing and flossing your teeth regularly may help limit this. See your dentist regularly and inform your dentist of the medicines you are taking. If you are going to have elective surgery, you may need to stop using this medicine before the surgery. Consult your health care professional for advice. This medicine does not protect you against HIV infection (AIDS) or any other sexually transmitted diseases. What side effects may I notice from receiving this medicine? Side effects that you should report to your doctor or  health care professional as soon as possible: -breast tissue changes or discharge -changes in vaginal bleeding during your period or between your periods -chest pain -coughing up blood -dizziness or fainting spells -headaches or migraines -leg, arm or groin pain -severe or sudden headaches -stomach pain (severe) -sudden shortness of breath -sudden loss of coordination, especially on one side of the body -speech problems -symptoms of vaginal infection like itching, irritation or unusual discharge -tenderness in the upper abdomen -vomiting -weakness or numbness in the arms or legs, especially on one side of the body -yellowing of the eyes or skin Side effects that usually do not require medical attention (report to your doctor or health care professional if they continue or are bothersome): -breakthrough bleeding and spotting that continues beyond the 3 initial cycles of pills -breast tenderness -mood changes, anxiety, depression, frustration, anger, or emotional outbursts -increased sensitivity to sun or ultraviolet light -nausea -skin rash, acne, or brown spots on the skin -weight gain (slight) This list may not describe all possible side effects. Call your doctor for medical advice about side effects. You may report side effects to FDA at 1-800-FDA-1088. Where should I keep my medicine? Keep out of the reach of children. Store at room temperature between 15 and 30 degrees C (59 and 86 degrees F) for up to 4 months. The product will expire after 4 months. Protect from light. Throw away any unused medicine after the expiration date. NOTE: This sheet is a summary. It may not cover all possible information. If you have questions about this medicine, talk to your doctor, pharmacist, or health care provider.  2015, [employer]. (2008-01-09  12:03:58)    

## 2014-09-02 NOTE — Progress Notes (Deleted)
Subjective:     Patient ID: Carrie [name], female   DOB: 24-Feb-1990, 24 y.o.   MRN: [id number]  HPI   Review of Systems     Objective:   Physical Exam     Assessment:     ***    Plan:     ***

## 2014-09-11 ENCOUNTER — Telehealth: Payer: Self-pay | Admitting: *Deleted

## 2014-09-11 DIAGNOSIS — B379 Candidiasis, unspecified: Secondary | ICD-10-CM

## 2014-09-11 MED ORDER — FLUCONAZOLE 150 MG PO TABS: 150.0000 mg | ORAL_TABLET | Freq: Once | ORAL | Status: DC

## 2014-09-11 MED ORDER — FLUCONAZOLE 150 MG PO TABS
150.0000 mg | ORAL_TABLET | Freq: Once | ORAL | Status: DC
Start: 2014-09-11 — End: 2014-09-11

## 2014-09-11 NOTE — Addendum Note (Signed)
Addended by: Kathee Delton on: 09/11/2014 12:08 PM   Modules accepted: Orders

## 2014-09-11 NOTE — Telephone Encounter (Signed)
Called patient and she states she has a bad yeast infection and she tried to take the OTC stuff but it actually made things worse. Patient states 1 pill never does it she usually needs two. Per Dr Erin Fulling, can prescribe two pills but patient should take 72 hours apart. Discussed with patient. Patient verbalized understanding to all and had no questions

## 2014-09-11 NOTE — Telephone Encounter (Signed)
Pt left message stating that she has a question. Please call back.

## 2014-10-15 ENCOUNTER — Ambulatory Visit (INDEPENDENT_AMBULATORY_CARE_PROVIDER_SITE_OTHER): Payer: BLUE CROSS/BLUE SHIELD | Admitting: Family

## 2014-10-15 ENCOUNTER — Encounter: Payer: Self-pay | Admitting: Family

## 2014-10-15 VITALS — BP 109/67 | HR 68 | Temp 98.7°F | Wt 162.8 lb

## 2014-10-15 DIAGNOSIS — Z124 Encounter for screening for malignant neoplasm of cervix: Secondary | ICD-10-CM | POA: Diagnosis not present

## 2014-10-15 DIAGNOSIS — Z01419 Encounter for gynecological examination (general) (routine) without abnormal findings: Secondary | ICD-10-CM | POA: Diagnosis not present

## 2014-10-15 NOTE — Progress Notes (Signed)
   Subjective:    Patient ID: Carrie [name], female    DOB: 18-Apr-1990, 24 y.o.   MRN: [id number]  HPI [name] is a 24 y.o. here for pap smear only.  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 Denies any other problems or concerns. Since delivering baby 8 wks ago pt states has not had a "typical"cycle.  Desires to use OCPs, has RX given at postpartum visit.  Verbalizes understanding of it being a POP and lower effectiveness.  Last intercourse one week ago.     Review of Systems  Gastrointestinal: Negative for nausea and vomiting.  Genitourinary: Negative for dysuria, frequency, vaginal bleeding, vaginal [name] and menstrual problem.  All other systems reviewed and are negative.       Objective:   Physical Exam  Constitutional: She is oriented to person, place, and time. She appears well-developed and well-nourished. No distress.  HENT:  Head: Normocephalic and atraumatic.  Neck: Normal range of motion. Neck supple. No thyromegaly present.  Abdominal: Bowel sounds are normal.  Neurological: She is alert and oriented to person, place, and time.  Skin: Skin is warm and dry.      Filed Vitals:   10/15/14 1533  BP: 109/67  Pulse: 68  Temp: 98.7 F (37.1 C)  Weight: 162 lb 12.8 oz (73.846 kg)    Assessment & Plan:  Hx of LSIL Family Planning Counseling  Plan: Pap to lab Reviewed various forms of family planning, opts for POPs; explained to abstain from sex additional week.  Complete pregnancy test and if negative begin POP with back-up method.   Follow-up as indicated  Marlis Edelson, CNM

## 2014-10-19 LAB — CYTOLOGY - PAP

## 2014-11-03 ENCOUNTER — Other Ambulatory Visit: Payer: Self-pay | Admitting: Family Medicine

## 2014-11-03 NOTE — Telephone Encounter (Signed)
Breastfeeding, Rx approved

## 2014-11-03 NOTE — Telephone Encounter (Signed)
rx

## 2014-11-06 DIAGNOSIS — E559 Vitamin D deficiency, unspecified: Secondary | ICD-10-CM | POA: Insufficient documentation

## 2014-11-06 DIAGNOSIS — F3181 Bipolar II disorder: Secondary | ICD-10-CM | POA: Insufficient documentation

## 2014-11-06 DIAGNOSIS — F99 Mental disorder, not otherwise specified: Secondary | ICD-10-CM | POA: Insufficient documentation

## 2014-11-11 ENCOUNTER — Ambulatory Visit: Payer: Self-pay | Admitting: Family Medicine

## 2014-11-18 ENCOUNTER — Ambulatory Visit (INDEPENDENT_AMBULATORY_CARE_PROVIDER_SITE_OTHER): Payer: BLUE CROSS/BLUE SHIELD | Admitting: Family Medicine

## 2014-11-18 ENCOUNTER — Encounter: Payer: Self-pay | Admitting: Family Medicine

## 2014-11-18 VITALS — BP 99/69 | HR 79 | Temp 97.1°F | Ht 62.0 in | Wt 162.0 lb

## 2014-11-18 DIAGNOSIS — L659 Nonscarring hair loss, unspecified: Secondary | ICD-10-CM | POA: Diagnosis not present

## 2014-11-18 DIAGNOSIS — Z0000 Encounter for general adult medical examination without abnormal findings: Secondary | ICD-10-CM

## 2014-11-18 DIAGNOSIS — R5382 Chronic fatigue, unspecified: Secondary | ICD-10-CM

## 2014-11-18 DIAGNOSIS — K5900 Constipation, unspecified: Secondary | ICD-10-CM | POA: Diagnosis not present

## 2014-11-18 DIAGNOSIS — L650 Telogen effluvium: Secondary | ICD-10-CM | POA: Diagnosis not present

## 2014-11-18 DIAGNOSIS — B379 Candidiasis, unspecified: Secondary | ICD-10-CM

## 2014-11-18 MED ORDER — FLUCONAZOLE 150 MG PO TABS: ORAL_TABLET | ORAL | Status: DC

## 2014-11-18 NOTE — Patient Instructions (Addendum)
Try to get 64 ounces of water a day Try to get 25 or more grams of fiber a day I believe you have telogen effluvium; your hair should grow back on its own over the coming months Consider a vitamin that contains biotin which may strengthen your hair and nails We'll let you know about the lab results drawn today Try to get more foods containing iron

## 2014-11-18 NOTE — Progress Notes (Signed)
BP 99/69 mmHg  Pulse 79  Temp(Src) 97.1 F (36.2 C)  Ht 5\' 2"  (1.575 m)  Wt 162 lb (73.483 kg)  BMI 29.62 kg/m2  SpO2 99%  LMP 11/11/2014 (Approximate)   Subjective:    Patient ID: Carrie [name], female    DOB: 03/08/1990, 24 y.o.   MRN: [id number]  HPI: Carrie [name] is a 24 y.o. female  Chief Complaint  Patient presents with  . Hair/Scalp Problem    Hair is falling out in chunks, started after she had her baby.   She had a baby in June and then her hair started falling out a month ago Thin spot up front, worse on the left side Hair will break No itching or redness of scalp Gaining weight Used to weight 120, can't get out 160s Goes to the bathroom twice a week, not new Always tired No skin rash or changes in the nails Delivery was unremarkable; natural childbirth Does think she has a yeast infection (vaginal) She has always been anemic She lost a lot of blood with her period last week, tampon and pads  Relevant past medical, surgical, family and social history reviewed and updated as indicated. Interim medical history since our last visit reviewed. Allergies and medications reviewed and updated.  Review of Systems  Per HPI unless specifically indicated above     Objective:    BP 99/69 mmHg  Pulse 79  Temp(Src) 97.1 F (36.2 C)  Ht 5\' 2"  (1.575 m)  Wt 162 lb (73.483 kg)  BMI 29.62 kg/m2  SpO2 99%  LMP 11/11/2014 (Approximate)  Wt Readings from Last 3 Encounters:  11/18/14 162 lb (73.483 kg)  11/14/13 153 lb (69.4 kg)  10/15/14 162 lb 12.8 oz (73.846 kg)    Physical Exam  Constitutional: She appears well-developed and well-nourished.  HENT:  Mouth/Throat: Mucous membranes are normal.  Eyes: EOM are normal. No scleral icterus.  Neck: No thyromegaly present.  Cardiovascular: Normal rate.   Pulmonary/Chest: Effort normal.  Neurological: She is alert. She displays no tremor.  Skin:  Diffuse mild thinning of hair on scalp; no loss of hair noted of  eyebrows; no breakage of scalp hair seen; scalp appears healthy without erythema or scale  Psychiatric: She has a normal mood and affect.      Assessment & Plan:   Problem List Items Addressed This Visit      Other   Preventative health care    Complete physical not done today, but drew labs for her physical today since we are doing other bloodwork, rather than her get two venipunctures      Relevant Orders   CBC with Differential/Platelet (Completed)   Comprehensive metabolic panel (Completed)   Lipid Panel w/o Chol/HDL Ratio (Completed)   TSH (Completed)    Other Visit Diagnoses    Telogen effluvium    -  Primary    explained diagnosis, expected course of condition    Hair loss        suspect telogen effluvium as this cause, but will check TSH    Constipation, unspecified constipation type        check TSH; fiber and water encouraged    Yeast infection        suspect vaginal candidiasis by history; treat with Rx; if recurs or does not clear up, return for exam, testing    Relevant Medications    fluconazole (DIFLUCAN) 150 MG tablet    Chronic fatigue        check labs  today; can certainly be related to deconditioning, being a mother, overweight, etc.       Follow up plan: No Follow-up on file. Meds ordered this encounter  Medications  . fluconazole (DIFLUCAN) 150 MG tablet    Sig: One by mouth on day 1, then skip a day, then take a 2nd pill if needed on day 3    Dispense:  2 tablet    Refill:  2   An after-visit summary was printed and given to the patient at check-out.  Please see the patient instructions which may contain other information and recommendations beyond what is mentioned above in the assessment and plan.

## 2014-11-19 LAB — CBC WITH DIFFERENTIAL/PLATELET
BASOS: 0 %
Basophils Absolute: 0 10*3/uL (ref 0.0–0.2)
EOS (ABSOLUTE): 0 10*3/uL (ref 0.0–0.4)
Eos: 0 %
Hematocrit: 34.1 % (ref 34.0–46.6)
Hemoglobin: 11.2 g/dL (ref 11.1–15.9)
IMMATURE GRANS (ABS): 0 10*3/uL (ref 0.0–0.1)
IMMATURE GRANULOCYTES: 0 %
LYMPHS: 52 %
Lymphocytes Absolute: 2.6 10*3/uL (ref 0.7–3.1)
MCH: 26.4 pg — ABNORMAL LOW (ref 26.6–33.0)
MCHC: 32.8 g/dL (ref 31.5–35.7)
MCV: 80 fL (ref 79–97)
MONOCYTES: 8 %
MONOS ABS: 0.4 10*3/uL (ref 0.1–0.9)
NEUTROS PCT: 40 %
Neutrophils Absolute: 2 10*3/uL (ref 1.4–7.0)
Platelets: 341 10*3/uL (ref 150–379)
RBC: 4.25 x10E6/uL (ref 3.77–5.28)
RDW: 16.5 % — ABNORMAL HIGH (ref 12.3–15.4)
WBC: 5 10*3/uL (ref 3.4–10.8)

## 2014-11-19 LAB — COMPREHENSIVE METABOLIC PANEL
A/G RATIO: 1.2 (ref 1.1–2.5)
ALT: 9 IU/L (ref 0–32)
AST: 12 IU/L (ref 0–40)
Albumin: 3.8 g/dL (ref 3.5–5.5)
Alkaline Phosphatase: 115 IU/L (ref 39–117)
BILIRUBIN TOTAL: 0.4 mg/dL (ref 0.0–1.2)
BUN / CREAT RATIO: 18 (ref 8–20)
BUN: 11 mg/dL (ref 6–20)
CALCIUM: 9.1 mg/dL (ref 8.7–10.2)
CHLORIDE: 101 mmol/L (ref 97–108)
CO2: 27 mmol/L (ref 18–29)
Creatinine, Ser: 0.6 mg/dL (ref 0.57–1.00)
GFR calc Af Amer: 148 mL/min/{1.73_m2} (ref >59)
GFR, EST NON AFRICAN AMERICAN: 128 mL/min/{1.73_m2} (ref >59)
GLOBULIN, TOTAL: 3.1 g/dL (ref 1.5–4.5)
Glucose: 83 mg/dL (ref 65–99)
POTASSIUM: 4.3 mmol/L (ref 3.5–5.2)
SODIUM: 141 mmol/L (ref 134–144)
TOTAL PROTEIN: 6.9 g/dL (ref 6.0–8.5)

## 2014-11-19 LAB — TSH: TSH: 1.45 u[IU]/mL (ref 0.450–4.500)

## 2014-11-19 LAB — LIPID PANEL W/O CHOL/HDL RATIO
Cholesterol, Total: 149 mg/dL (ref 100–199)
HDL: 52 mg/dL (ref >39)
LDL Calculated: 91 mg/dL (ref 0–99)
Triglycerides: 31 mg/dL (ref 0–149)
VLDL Cholesterol Cal: 6 mg/dL (ref 5–40)

## 2014-11-23 NOTE — Assessment & Plan Note (Signed)
Complete physical not done today, but drew labs for her physical today since we are doing other bloodwork, rather than her get two venipunctures

## 2014-11-26 ENCOUNTER — Telehealth: Payer: Self-pay | Admitting: Family Medicine

## 2014-11-26 NOTE — Telephone Encounter (Signed)
I talked with patient about her labs; overall, look great

## 2015-03-29 ENCOUNTER — Encounter: Payer: Self-pay | Admitting: Family Medicine

## 2015-03-29 ENCOUNTER — Ambulatory Visit (INDEPENDENT_AMBULATORY_CARE_PROVIDER_SITE_OTHER): Payer: BLUE CROSS/BLUE SHIELD | Admitting: Family Medicine

## 2015-03-29 VITALS — BP 117/81 | HR 99 | Temp 98.7°F | Ht 61.0 in | Wt 169.0 lb

## 2015-03-29 DIAGNOSIS — B349 Viral infection, unspecified: Secondary | ICD-10-CM

## 2015-03-29 DIAGNOSIS — R05 Cough: Secondary | ICD-10-CM

## 2015-03-29 DIAGNOSIS — M791 Myalgia, unspecified site: Secondary | ICD-10-CM

## 2015-03-29 DIAGNOSIS — R059 Cough, unspecified: Secondary | ICD-10-CM

## 2015-03-29 LAB — PLEASE NOTE:

## 2015-03-29 LAB — INFLUENZA A AND B
INFLUENZA B AG, EIA: NEGATIVE
Influenza A Ag, EIA: NEGATIVE

## 2015-03-29 NOTE — Patient Instructions (Addendum)
Carrie [name] --> please write note to be out of work/school all week; she can return on Sunday April 04, 2015  You can try Mucinex plain (just for mucous) or Mucinex DM (for mucous, and also has a cough suppressant) Try vitamin C (orange juice if not diabetic or vitamin C tablets) and drink green tea to help your immune system during your illness Get plenty of rest and hydration Watch for signs/symptoms of secondary bacterial infection which might include trouble breathing, fever, worsening cough, or chest pain; if those occur, seek medical attention right away, as they could indicate developing pneumonia  Viral Infections A viral infection can be caused by different types of viruses.Most viral infections are not serious and resolve on their own. However, some infections may cause severe symptoms and may lead to further complications. SYMPTOMS Viruses can frequently cause:  Minor sore throat.  Aches and pains.  Headaches.  Runny nose.  Different types of rashes.  Watery eyes.  Tiredness.  Cough.  Loss of appetite.  Gastrointestinal infections, resulting in nausea, vomiting, and diarrhea. These symptoms do not respond to antibiotics because the infection is not caused by bacteria. However, you might catch a bacterial infection following the viral infection. This is sometimes called a "superinfection." Symptoms of such a bacterial infection may include:  Worsening sore throat with pus and difficulty swallowing.  Swollen neck glands.  Chills and a high or persistent fever.  Severe headache.  Tenderness over the sinuses.  Persistent overall ill feeling (malaise), muscle aches, and tiredness (fatigue).  Persistent cough.  Yellow, green, or brown mucus production with coughing. HOME CARE INSTRUCTIONS   Only take over-the-counter or prescription medicines for pain, discomfort, diarrhea, or fever as directed by your caregiver.  Drink enough water and fluids to keep your urine  clear or pale yellow. Sports drinks can provide valuable electrolytes, sugars, and hydration.  Get plenty of rest and maintain proper nutrition. Soups and broths with crackers or rice are fine. SEEK IMMEDIATE MEDICAL CARE IF:   You have severe headaches, shortness of breath, chest pain, neck pain, or an unusual rash.  You have uncontrolled vomiting, diarrhea, or you are unable to keep down fluids.  You or your child has an oral temperature above 102 F (38.9 C), not controlled by medicine.  Your baby is older than 3 months with a rectal temperature of 102 F (38.9 C) or higher.  Your baby is 68 months old or younger with a rectal temperature of 100.4 F (38 C) or higher. MAKE SURE YOU:   Understand these instructions.  Will watch your condition.  Will get help right away if you are not doing well or get worse.   This information is not intended to replace advice given to you by your health care provider. Make sure you discuss any questions you have with your health care provider.   Document Released: 11/02/2004 Document Revised: 04/17/2011 Document Reviewed: 07/01/2014 Elsevier Interactive Patient Education [employer]

## 2015-03-29 NOTE — Progress Notes (Signed)
BP 117/81 mmHg  Pulse 99  Temp(Src) 98.7 F (37.1 C)  Ht  (1.549 m)  Wt 169 lb (76.658 kg)  BMI 31.95 kg/m2  SpO2 99%  LMP 03/05/2015 (Approximate)  Breastfeeding? Unknown   Subjective:    Patient ID: Carrie [name], female    DOB: 1990/06/02, 25 y.o.   MRN: [id number]  HPI: Carrie [name] is a 25 y.o. female  Chief Complaint  Patient presents with  . Hoarse    Loosing voice in and out  . Cough  . Sore Throat  . Nasal Congestion    Comes and goes  . Fatigue    Patient states "woozy and weak"   She got sick on Friday; they make her work on Saturday She is coughing, half of her throat is numb; hurts to cough Sore throat but better already Body was hurting and felt weak Patient got the flu shot Others in the house have been sick too She has been using nyquil, dayquil, benadryl   Relevant past medical history reviewed and updated as indicated. Interim medical history since our last visit reviewed. Allergies and medications reviewed and updated.  Review of Systems  HENT: Positive for sore throat (at first, resolved now) and voice change. Negative for ear discharge and ear pain.   Respiratory: Positive for cough.   Musculoskeletal: Positive for myalgias.  Per HPI unless specifically indicated above     Objective:    BP 117/81 mmHg  Pulse 99  Temp(Src) 98.7 F (37.1 C)  Ht  (1.549 m)  Wt 169 lb (76.658 kg)  BMI 31.95 kg/m2  SpO2 99%  LMP 03/05/2015 (Approximate)  Breastfeeding? Unknown  Wt Readings from Last 3 Encounters:  03/29/15 169 lb (76.658 kg)  11/18/14 162 lb (73.483 kg)  11/14/13 153 lb (69.4 kg)    Physical Exam  Constitutional: She appears well-developed and well-nourished. No distress.  HENT:  Right Ear: Hearing, tympanic membrane, external ear and ear canal normal. No drainage. Tympanic membrane is not injected and not erythematous. No middle ear effusion.  Left Ear: Hearing, tympanic membrane, external ear and ear canal normal. No  drainage. Tympanic membrane is not injected and not erythematous.  No middle ear effusion.  Nose: Rhinorrhea (clear) present.  Mouth/Throat: Oropharynx is clear and moist and mucous membranes are normal. Mucous membranes are not dry. No oropharyngeal exudate, posterior oropharyngeal edema or posterior oropharyngeal erythema.  Eyes: EOM are normal. Right eye exhibits no discharge. Left eye exhibits no discharge. No scleral icterus.  Cardiovascular: Normal rate and regular rhythm.   Pulmonary/Chest: Effort normal and breath sounds normal. No respiratory distress. She has no wheezes. She has no rales.  Lymphadenopathy:    She has no cervical adenopathy.  Skin: Skin is warm and dry. No rash noted. She is not diaphoretic. No pallor.      Assessment & Plan:   Problem List Items Addressed This Visit    None    Visit Diagnoses    Viral illness    -  Primary    testing for flu today; discussed reasons to seek immediate help, risk for developing secondary pneumonia; rest, hydration, vit C, green tea    Myalgia        rest, hydration    Relevant Orders    Influenza a and b    Cough        rest, hydration, mucinex plain or DM; inhaler if needed; watch for worsening cough       Follow up  plan: Return if symptoms worsen or fail to improve.

## 2015-04-07 ENCOUNTER — Encounter: Payer: Self-pay | Admitting: Family Medicine

## 2015-04-07 ENCOUNTER — Ambulatory Visit (INDEPENDENT_AMBULATORY_CARE_PROVIDER_SITE_OTHER): Payer: BLUE CROSS/BLUE SHIELD | Admitting: Family Medicine

## 2015-04-07 VITALS — BP 121/81 | HR 94 | Temp 99.2°F | Ht 60.4 in | Wt 166.0 lb

## 2015-04-07 DIAGNOSIS — J020 Streptococcal pharyngitis: Secondary | ICD-10-CM

## 2015-04-07 DIAGNOSIS — R300 Dysuria: Secondary | ICD-10-CM

## 2015-04-07 DIAGNOSIS — J029 Acute pharyngitis, unspecified: Secondary | ICD-10-CM | POA: Diagnosis not present

## 2015-04-07 DIAGNOSIS — N3001 Acute cystitis with hematuria: Secondary | ICD-10-CM | POA: Diagnosis not present

## 2015-04-07 MED ORDER — AMOXICILLIN 500 MG PO CAPS: 500.0000 mg | ORAL_CAPSULE | Freq: Two times a day (BID) | ORAL | Status: DC

## 2015-04-07 MED ORDER — FLUCONAZOLE 150 MG PO TABS: ORAL_TABLET | ORAL | Status: DC

## 2015-04-07 NOTE — Progress Notes (Signed)
BP 121/81 mmHg  Pulse 94  Temp(Src) 99.2 F (37.3 C)  Ht 5' 0.4" (1.534 m)  Wt 166 lb (75.297 kg)  BMI 32.00 kg/m2  SpO2 99%  LMP 04/05/2015 (Exact Date)   Subjective:    Patient ID: Carrie [name], female    DOB: 11/12/1990, 25 y.o.   MRN: [id number]  HPI: Carrie [name] is a 25 y.o. female  Chief Complaint  Patient presents with  . Urinary Tract Infection    Patient is currently on her period  . Sore Throat    X 2 days, hard to swallow and talk. If a antibiotic is given she will need another diflucan   UPPER RESPIRATORY TRACT INFECTION Worst symptom: 2 weeks Fever: no Cough: no Shortness of breath: no Wheezing: no Chest pain: no Chest tightness: no Chest congestion: no Nasal congestion: no Runny nose: no Post nasal drip: no Sneezing: no Sore throat: yes Swollen glands: yes Sinus pressure: no Headache: no Face pain: no Toothache: no Ear pain: no  Ear pressure: no  Eyes red/itching:no Eye drainage/crusting: no  Vomiting: no Rash: no Fatigue: yes Sick contacts: no Strep contacts: no  Context: worse Recurrent sinusitis: no Relief with OTC cold/cough medications: no  Treatments attempted: none   URINARY SYMPTOMS Duration: Monday Dysuria: no Urinary frequency: yes Urgency: yes Small volume voids: yes Symptom severity: moderate Urinary incontinence: no Foul odor: yes Hematuria: no Abdominal pain: no Back pain: no Suprapubic pain/pressure: no Flank pain: no Fever:  no Vomiting: no Relief with cranberry juice: no Relief with pyridium: no Status: worse Previous urinary tract infection: no Recurrent urinary tract infection: no Sexual activity: No sexually active/monogomous/practicing safe sex History of sexually transmitted disease: no Vaginal discharge: no Treatments attempted: none    Relevant past medical, surgical, family and social history reviewed and updated as indicated. Interim medical history since our last visit reviewed. Allergies  and medications reviewed and updated.  Review of Systems  Constitutional: Negative.   HENT: Negative.   Respiratory: Negative.   Cardiovascular: Negative.   Psychiatric/Behavioral: Negative.     Per HPI unless specifically indicated above     Objective:    BP 121/81 mmHg  Pulse 94  Temp(Src) 99.2 F (37.3 C)  Ht 5' 0.4" (1.534 m)  Wt 166 lb (75.297 kg)  BMI 32.00 kg/m2  SpO2 99%  LMP 04/05/2015 (Exact Date)  Wt Readings from Last 3 Encounters:  04/07/15 166 lb (75.297 kg)  03/29/15 169 lb (76.658 kg)  11/18/14 162 lb (73.483 kg)    Physical Exam  Constitutional: She is oriented to person, place, and time. She appears well-developed and well-nourished. No distress.  HENT:  Head: Normocephalic and atraumatic.  Right Ear: Hearing and external ear normal.  Left Ear: Hearing normal.  Nose: Nose normal.  Mouth/Throat: Oropharyngeal exudate present.  Eyes: Conjunctivae and lids are normal. Pupils are equal, round, and reactive to light. Right eye exhibits no discharge. Left eye exhibits no discharge. No scleral icterus.  Neck: Normal range of motion. Neck supple. No JVD present. No tracheal deviation present. No thyromegaly present.  Cardiovascular: Normal rate, regular rhythm, normal heart sounds and intact distal pulses.  Exam reveals no gallop and no friction rub.   No murmur heard. Pulmonary/Chest: Effort normal and breath sounds normal. No stridor. No respiratory distress. She has no wheezes. She has no rales. She exhibits no tenderness.  Abdominal: Soft. Bowel sounds are normal. She exhibits no distension and no mass. There is no tenderness. There is no rebound,  no guarding and no CVA tenderness.  Musculoskeletal: Normal range of motion.  Lymphadenopathy:    She has cervical adenopathy.  Neurological: She is alert and oriented to person, place, and time.  Skin: Skin is warm, dry and intact. No rash noted. She is not diaphoretic. No erythema. No pallor.  Psychiatric: She  has a normal mood and affect. Her speech is normal and behavior is normal. Judgment and thought content normal. Cognition and memory are normal.  Nursing note and vitals reviewed.   Results for orders placed or performed in visit on 03/29/15  Influenza a and b  Result Value Ref Range   Influenza A Ag, EIA Negative Negative   Influenza B Ag, EIA Negative Negative   Influenza Comment See note   Please note:  Result Value Ref Range   Please note: Comment       Assessment & Plan:   Problem List Items Addressed This Visit    None    Visit Diagnoses    Strep pharyngitis    -  Primary    + strep. Rx for amoxicillin given. Call if not getting better or getting worse.     Relevant Medications    fluconazole (DIFLUCAN) 150 MG tablet    Dysuria        Will check UA. On her menses.     Relevant Orders    UA/M w/rflx Culture, Routine    Sore throat        Will check for strep    Relevant Orders    Rapid strep screen (not at [facility])    Acute cystitis with hematuria        Will treat with amoxicillin, await culture, if restistant will change medication. She is aware.         Follow up plan: Return if symptoms worsen or fail to improve.

## 2015-04-10 LAB — UA/M W/RFLX CULTURE, ROUTINE
Bilirubin, UA: NEGATIVE
Leukocytes, UA: NEGATIVE
NITRITE UA: POSITIVE — AB
PH UA: 7 (ref 5.0–7.5)
Specific Gravity, UA: 1.02 (ref 1.005–1.030)
Urobilinogen, Ur: >8 mg/dL — ABNORMAL HIGH (ref 0.2–1.0)

## 2015-04-10 LAB — URINE CULTURE, REFLEX

## 2015-04-10 LAB — RAPID STREP SCREEN (MED CTR MEBANE ONLY): Strep Gp A Ag, IA W/Reflex: POSITIVE — AB

## 2015-04-10 LAB — MICROSCOPIC EXAMINATION

## 2016-02-29 ENCOUNTER — Encounter: Payer: BLUE CROSS/BLUE SHIELD | Admitting: Family Medicine

## 2016-03-21 ENCOUNTER — Ambulatory Visit (INDEPENDENT_AMBULATORY_CARE_PROVIDER_SITE_OTHER): Payer: BLUE CROSS/BLUE SHIELD | Admitting: Family Medicine

## 2016-03-21 ENCOUNTER — Encounter: Payer: Self-pay | Admitting: Family Medicine

## 2016-03-21 DIAGNOSIS — F902 Attention-deficit hyperactivity disorder, combined type: Secondary | ICD-10-CM | POA: Diagnosis not present

## 2016-03-21 DIAGNOSIS — F3181 Bipolar II disorder: Secondary | ICD-10-CM

## 2016-03-21 DIAGNOSIS — F909 Attention-deficit hyperactivity disorder, unspecified type: Secondary | ICD-10-CM | POA: Insufficient documentation

## 2016-03-21 NOTE — Progress Notes (Signed)
BP 120/84   Pulse 82   Temp 97.9 F (36.6 C) (Oral)   Resp 16   Ht 5' 1.9" (1.572 m)   Wt 163 lb 2 oz (74 kg)   LMP 03/17/2016   SpO2 100%   BMI 29.93 kg/m    Subjective:    Patient ID: Carrie [name], female    DOB: [birth year], 26 y.o.   MRN: [id number]  HPI: Carrie [name] is a 26 y.o. female  Chief Complaint  Patient presents with  . Annual Exam   Problem-based visit today instead of physical  She has had ADHD since childhood She was on Adderall and was "seeing psychiatrist and everything" Stopped taking it (medicine for ADHD) in high school; just stopped taking it, no problems or side effects She is in college now and having problems; getting side-tracked really fast; cannot stay on one thing Even at work, she's like that; she'll clean one thing, then go to another things Multiple things at once; things get half-done Medical office administration studies No stimulant medicine in about 8 years Brother had ADHD Patient was also diagnosed with "partial schizophrenia and bipolar disorder" Patient was fighting with her mother; went to jail, then to group home, then to mental hospital, then saw psychiatrist; all between ages 33 and 63 Not getting enough sleep; no caffeine, just water Used to smoke, quit  Depression screen [facility] 2/9 03/21/2016  Decreased Interest 1  Down, Depressed, Hopeless 3  PHQ - 2 Score 4  Altered sleeping 1  Tired, decreased energy 1  Change in appetite 0  Feeling bad or failure about yourself  0  Trouble concentrating 0  Moving slowly or fidgety/restless 0  Suicidal thoughts 0  PHQ-9 Score 6  Difficult doing work/chores Not difficult at all  MD notes: Trouble concentrating is a 3 Fidgety or restless is a 0 Suicidal thoughts is a 0 Score should be at least a 9, not a 6  Relevant past medical, surgical, family and social history reviewed Past Medical History:  Diagnosis Date  . Asthma   . Bipolar 2 disorder (HCC)   . Psychiatric  disorder   . Vitamin D deficiency disease    Past Surgical History:  Procedure Laterality Date  . TONSILLECTOMY     Family History  Problem Relation Age of Onset  . ADD / ADHD Mother     suspected  . Depression Sister   . ADD / ADHD Brother   . Seizures Son   . Mental illness Paternal Uncle     schizophrenia, bipolar, ADHD  . Cancer Neg Hx   . Diabetes Neg Hx   . Heart disease Neg Hx   . Hypertension Neg Hx   . Stroke Neg Hx   . COPD Neg Hx    Social History  Substance Use Topics  . Smoking status: Never Smoker  . Smokeless tobacco: Never Used  . Alcohol use No  MD note: prior smoker; quit with pregnancy; smoked on and off for a year  Interim medical history since last visit reviewed. Allergies and medications reviewed  Review of Systems Per HPI unless specifically indicated above     Objective:    BP 120/84   Pulse 82   Temp 97.9 F (36.6 C) (Oral)   Resp 16   Ht 5' 1.9" (1.572 m)   Wt 163 lb 2 oz (74 kg)   LMP 03/17/2016   SpO2 100%   BMI 29.93 kg/m   Wt Readings from Last 3  Encounters:  03/21/16 163 lb 2 oz (74 kg)  04/07/15 166 lb (75.3 kg)  03/29/15 169 lb (76.7 kg)    Physical Exam  Constitutional: She appears well-developed and well-nourished.  Eyes: EOM are normal.  Cardiovascular: Normal rate and regular rhythm.   No extrasystoles are present.  Pulmonary/Chest: Effort normal and breath sounds normal.  Neurological: She displays no tremor.  No tics  Psychiatric: She has a normal mood and affect. Her behavior is normal. Judgment and thought content normal. Her mood appears not anxious. Her speech is not rapid and/or pressured. She is not agitated, not aggressive, not hyperactive and not combative. Cognition and memory are normal. She does not express impulsivity.      Assessment & Plan:   Problem List Items Addressed This Visit      Other   Bipolar 2 disorder (HCC)    Refer to psychiatry for management and medication      Relevant  Orders   Ambulatory referral to Psychiatry   ADHD    Refer to psychiatrist; [facility] and check out the book Answers to Distraction      Relevant Orders   Ambulatory referral to Psychiatry      Follow up plan: Return in about 1 month (around 04/18/2016) for complete physical in the next month.  An after-visit summary was printed and given to the patient at check-out.  Please see the patient instructions which may contain other information and recommendations beyond what is mentioned above in the assessment and plan.  No orders of the defined types were placed in this encounter.  Orders Placed This Encounter  Procedures  . Ambulatory referral to Psychiatry

## 2016-03-21 NOTE — Patient Instructions (Addendum)
OHIO = only handle it once Check out the book Answers to Distraction by Peggye PittHallowell and [place] We'll have you see the psychiatrist to get you evaluated and on medicine

## 2016-03-21 NOTE — Assessment & Plan Note (Signed)
Refer to psychiatry for management and medication

## 2016-03-21 NOTE — Assessment & Plan Note (Signed)
Refer to psychiatrist; [facility] and check out the book Answers to Distraction

## 2016-04-18 ENCOUNTER — Encounter: Payer: BLUE CROSS/BLUE SHIELD | Admitting: Family Medicine

## 2016-09-21 ENCOUNTER — Ambulatory Visit (INDEPENDENT_AMBULATORY_CARE_PROVIDER_SITE_OTHER): Payer: BLUE CROSS/BLUE SHIELD | Admitting: Family Medicine

## 2016-09-21 ENCOUNTER — Encounter: Payer: Self-pay | Admitting: Family Medicine

## 2016-09-21 VITALS — BP 113/77 | HR 75 | Temp 98.6°F | Wt 164.0 lb

## 2016-09-21 DIAGNOSIS — N898 Other specified noninflammatory disorders of vagina: Secondary | ICD-10-CM

## 2016-09-21 LAB — WET PREP FOR TRICH, YEAST, CLUE
CLUE CELL EXAM: NEGATIVE
TRICHOMONAS EXAM: NEGATIVE
YEAST EXAM: NEGATIVE

## 2016-09-21 MED ORDER — FLUCONAZOLE 150 MG PO TABS
150.0000 mg | ORAL_TABLET | Freq: Once | ORAL | 1 refills | Status: AC
Start: 2016-09-21 — End: 2016-09-21

## 2016-09-21 MED ORDER — METRONIDAZOLE 500 MG PO TABS: 500.0000 mg | ORAL_TABLET | Freq: Two times a day (BID) | ORAL | 0 refills | Status: DC

## 2016-09-21 NOTE — Patient Instructions (Signed)
Follow up as needed

## 2016-09-21 NOTE — Progress Notes (Signed)
   BP 113/77   Pulse 75   Temp 98.6 F (37 C)   Wt 164 lb (74.4 kg)   LMP 09/21/2016 (Exact Date)   SpO2 100%   Breastfeeding? No   BMI 30.09 kg/m    Subjective:    Patient ID: Carrie JacobsKristen [name], female    DOB: 10/16/1990, 26 y.o.   MRN: [id number]  HPI: Carrie JacobsKristen [name] is a 26 y.o. female  Chief Complaint  Patient presents with  . Vaginal Discharge    x 1 week, burns sometimes, discharge, odor.   Patient presents with 1 week of vaginal irritation, discharge, and odor. Long hx of yeast infections. Denies itching, lesions, fever, dysuria, frequency. Has not been trying anything OTC. Of note, pt just started her period today.   Relevant past medical, surgical, family and social history reviewed and updated as indicated. Interim medical history since our last visit reviewed. Allergies and medications reviewed and updated.  Review of Systems  Constitutional: Negative.   HENT: Negative.   Respiratory: Negative.   Cardiovascular: Negative.   Gastrointestinal: Negative.   Genitourinary: Positive for vaginal discharge.  Musculoskeletal: Negative.   Neurological: Negative.   Psychiatric/Behavioral: Negative.    Per HPI unless specifically indicated above     Objective:    BP 113/77   Pulse 75   Temp 98.6 F (37 C)   Wt 164 lb (74.4 kg)   LMP 09/21/2016 (Exact Date)   SpO2 100%   Breastfeeding? No   BMI 30.09 kg/m   Wt Readings from Last 3 Encounters:  09/21/16 164 lb (74.4 kg)  03/21/16 163 lb 2 oz (74 kg)  04/07/15 166 lb (75.3 kg)    Physical Exam  Constitutional: She is oriented to person, place, and time. She appears well-developed and well-nourished. No distress.  HENT:  Head: Atraumatic.  Eyes: Pupils are equal, round, and reactive to light. Conjunctivae are normal.  Neck: Normal range of motion. Neck supple.  Cardiovascular: Normal rate and normal heart sounds.   Pulmonary/Chest: Effort normal and breath sounds normal. No respiratory distress.    Musculoskeletal: Normal range of motion.  Neurological: She is alert and oriented to person, place, and time.  Skin: Skin is warm and dry.  Psychiatric: She has a normal mood and affect. Her behavior is normal.  Nursing note and vitals reviewed.     Assessment & Plan:   Problem List Items Addressed This Visit    None    Visit Diagnoses    Vaginal discharge    -  Primary   Wet prep inconclusive d/t menstrual blood. Will cover for yeast and BV w/ flagyl and diflucan. Probiotic, unscented soaps, cotton panties. F/u if not improving   Relevant Orders   WET PREP FOR TRICH, YEAST, CLUE       Follow up plan: Return if symptoms worsen or fail to improve.

## 2016-11-15 ENCOUNTER — Ambulatory Visit: Payer: BLUE CROSS/BLUE SHIELD | Admitting: Family Medicine

## 2016-11-15 ENCOUNTER — Telehealth: Payer: Self-pay | Admitting: Family Medicine

## 2016-11-15 NOTE — Telephone Encounter (Signed)
Patient notified

## 2016-11-15 NOTE — Telephone Encounter (Signed)
Medication is not working and patient missed one dosage of medication,metronidazole, and wonders if that may be why the medication may not be working for her.   Patient would like to speak with CMA or provider regarding medication and what is going on with her.  Please Advise.  Thank you

## 2016-11-15 NOTE — Telephone Encounter (Signed)
Called and spoke to patient. She states she was taking the flagyl about a month or two ago and missed the last pill. She states that since then, she has been having a very foul smelling menstrual cycle and brown discharge. Patient would like to know what she needs to do or if she needs to be seen again.

## 2016-11-15 NOTE — Telephone Encounter (Signed)
She should come back in for retest after her period ends. Take probiotics, no scented soaps or feminine washes

## 2016-11-22 ENCOUNTER — Ambulatory Visit: Payer: BLUE CROSS/BLUE SHIELD | Admitting: Family Medicine

## 2016-11-23 ENCOUNTER — Encounter: Payer: Self-pay | Admitting: Family Medicine

## 2016-11-23 ENCOUNTER — Ambulatory Visit (INDEPENDENT_AMBULATORY_CARE_PROVIDER_SITE_OTHER): Payer: BLUE CROSS/BLUE SHIELD | Admitting: Family Medicine

## 2016-11-23 VITALS — BP 107/71 | HR 75 | Temp 98.2°F | Wt 177.0 lb

## 2016-11-23 DIAGNOSIS — N898 Other specified noninflammatory disorders of vagina: Secondary | ICD-10-CM | POA: Diagnosis not present

## 2016-11-23 DIAGNOSIS — N760 Acute vaginitis: Secondary | ICD-10-CM | POA: Diagnosis not present

## 2016-11-23 DIAGNOSIS — B9689 Other specified bacterial agents as the cause of diseases classified elsewhere: Secondary | ICD-10-CM

## 2016-11-23 LAB — WET PREP FOR TRICH, YEAST, CLUE
Clue Cell Exam: POSITIVE — AB
Trichomonas Exam: NEGATIVE
YEAST EXAM: NEGATIVE

## 2016-11-23 MED ORDER — PRETAB 29-1 MG PO TABS: 1.0000 | ORAL_TABLET | Freq: Every day | ORAL | 6 refills | Status: DC

## 2016-11-23 MED ORDER — METRONIDAZOLE 500 MG PO TABS: 500.0000 mg | ORAL_TABLET | Freq: Two times a day (BID) | ORAL | 0 refills | Status: DC

## 2016-11-23 MED ORDER — FLUCONAZOLE 150 MG PO TABS: 150.0000 mg | ORAL_TABLET | Freq: Once | ORAL | 0 refills | Status: AC

## 2016-11-23 MED ORDER — METRONIDAZOLE 0.75 % VA GEL: 1.0000 | Freq: Two times a day (BID) | VAGINAL | 3 refills | Status: DC

## 2016-11-23 NOTE — Progress Notes (Signed)
   BP 107/71   Pulse 75   Temp 98.2 F (36.8 C)   Wt 177 lb (80.3 kg)   LMP 11/14/2016 (Approximate)   SpO2 100%   BMI 32.48 kg/m    Subjective:    Patient ID: Carrie JacobsKristen [name], female    DOB: 03/29/1990, 26 y.o.   MRN: [id number]  HPI: Carrie JacobsKristen [name] is a 26 y.o. female  Chief Complaint  Patient presents with  . Vaginitis    still been having some symptoms since her last visit. Brownish discharge. Odor. No itching or burning.   Patient with persistent brownish white discharge and vaginal odor x 1-2 months. Was treated back in August for BV with short term relief. Does not use scented soaps or douches. Not trying anything OTC for sxs.   Relevant past medical, surgical, family and social history reviewed and updated as indicated. Interim medical history since our last visit reviewed. Allergies and medications reviewed and updated.  Review of Systems  Constitutional: Negative.   Respiratory: Negative.   Cardiovascular: Negative.   Gastrointestinal: Negative.   Genitourinary: Positive for vaginal discharge (and odor).  Musculoskeletal: Negative.   Neurological: Negative.   Psychiatric/Behavioral: Negative.     Per HPI unless specifically indicated above     Objective:    BP 107/71   Pulse 75   Temp 98.2 F (36.8 C)   Wt 177 lb (80.3 kg)   LMP 11/14/2016 (Approximate)   SpO2 100%   BMI 32.48 kg/m   Wt Readings from Last 3 Encounters:  11/23/16 177 lb (80.3 kg)  09/21/16 164 lb (74.4 kg)  03/21/16 163 lb 2 oz (74 kg)    Physical Exam  Constitutional: She is oriented to person, place, and time. She appears well-developed and well-nourished.  HENT:  Head: Atraumatic.  Eyes: Pupils are equal, round, and reactive to light. Conjunctivae are normal.  Neck: Normal range of motion. Neck supple.  Cardiovascular: Normal rate.   Pulmonary/Chest: Effort normal. No respiratory distress.  Genitourinary: Vaginal discharge (white) found.  Musculoskeletal: Normal range of  motion.  Neurological: She is alert and oriented to person, place, and time.  Skin: Skin is warm and dry.  Psychiatric: She has a normal mood and affect. Her behavior is normal.  Nursing note and vitals reviewed.  Results for orders placed or performed in visit on 11/23/16  WET PREP FOR TRICH, YEAST, CLUE  Result Value Ref Range   Trichomonas Exam Negative Negative   Yeast Exam Negative Negative   Clue Cell Exam Positive (A) Negative      Assessment & Plan:   Problem List Items Addressed This Visit    None    Visit Diagnoses    BV (bacterial vaginosis)    -  Primary   Wet prep + for BV. Will treat with PO flagyl followed by several weeks of topical flagyl. Start probiotics, re-iterated good vaginal hygiene.    Relevant Medications   metroNIDAZOLE (FLAGYL) 500 MG tablet   Other Relevant Orders   WET PREP FOR TRICH, YEAST, CLUE (Completed)       Follow up plan: Return if symptoms worsen or fail to improve.

## 2016-11-25 NOTE — Patient Instructions (Signed)
Follow up as needed

## 2017-01-07 DIAGNOSIS — A084 Viral intestinal infection, unspecified: Secondary | ICD-10-CM | POA: Diagnosis not present

## 2017-01-25 ENCOUNTER — Ambulatory Visit: Payer: Self-pay

## 2017-01-25 DIAGNOSIS — T63301A Toxic effect of unspecified spider venom, accidental (unintentional), initial encounter: Secondary | ICD-10-CM | POA: Diagnosis not present

## 2017-01-25 NOTE — Telephone Encounter (Signed)
FYI

## 2017-01-25 NOTE — Telephone Encounter (Signed)
Phone call from pt.  Reported she has a bite on the right side of her neck, just beneath the ear.  Reported she has been itching in that area all week, and noticed 2-3 days ago, a cluster of raised red bumps started to appear. C/o area is tender to touch, and itches. Denied fever/ chills.  Denied any muscular aches, abdominal pain, or change in the color of urine.  Advised per protocol, she should be evaluated within 4 hours.  No available openings at PCP office.  Offered pt. To schedule an appt. At [facility].  Stated she didn't want to drive to [place].  Advised she should go to the [place] in [place]. Care advice given per protocol.  Verb. Understanding.            Reason for Disposition . [1] Red or very tender (to touch) area AND [2] started over 24 hours after the bite  Answer Assessment - Initial Assessment Questions 1. TYPE of SPIDER: "What type of spider was it?"  (e.g., name, unknown, or brief description)     Did not see the spider 2. LOCATION: "Where is the bite located?"      Neck area on right, beneath the ear  3. PAIN: "Is there any pain?" If so, ask: "How bad is it?"  (Scale 1-10; or mild, moderate, severe)     Painful when laying on it or touching it 4. SWELLING: "How big is the swelling?" (Inches, cm or compare to coins)      Cluster of raised bumps 5. ONSET: "When did the bite occur?" (Minutes or hours ago)      Neck has been itching all week; 2- 3 days ago noticed the bumps   6. TETANUS: "When was the last tetanus booster?"      Haven't had one in past 10 years.  7. OTHER SYMPTOMS: "Do you have any other symptoms?"  (e.g., muscle cramps, abdominal pain, change in urine color)    Denies the above.  Protocols used: SPIDER BITE - [place]-A-AH

## 2017-01-29 ENCOUNTER — Encounter: Payer: Self-pay | Admitting: Family Medicine

## 2017-01-29 ENCOUNTER — Ambulatory Visit: Payer: BLUE CROSS/BLUE SHIELD | Admitting: Unknown Physician Specialty

## 2017-01-29 ENCOUNTER — Ambulatory Visit (INDEPENDENT_AMBULATORY_CARE_PROVIDER_SITE_OTHER): Payer: BLUE CROSS/BLUE SHIELD | Admitting: Family Medicine

## 2017-01-29 VITALS — BP 117/78 | HR 99 | Temp 98.4°F | Wt 174.0 lb

## 2017-01-29 DIAGNOSIS — N760 Acute vaginitis: Secondary | ICD-10-CM | POA: Diagnosis not present

## 2017-01-29 DIAGNOSIS — B029 Zoster without complications: Secondary | ICD-10-CM

## 2017-01-29 DIAGNOSIS — B9689 Other specified bacterial agents as the cause of diseases classified elsewhere: Secondary | ICD-10-CM

## 2017-01-29 MED ORDER — TRAMADOL HCL 50 MG PO TABS: 50.0000 mg | ORAL_TABLET | Freq: Four times a day (QID) | ORAL | 0 refills | Status: DC | PRN

## 2017-01-29 MED ORDER — CLINDAMYCIN HCL 300 MG PO CAPS: 300.0000 mg | ORAL_CAPSULE | Freq: Three times a day (TID) | ORAL | 0 refills | Status: DC

## 2017-01-29 MED ORDER — PREDNISONE 10 MG PO TABS: ORAL_TABLET | ORAL | 0 refills | Status: DC

## 2017-01-29 MED ORDER — VALACYCLOVIR HCL 1 G PO TABS: 1000.0000 mg | ORAL_TABLET | Freq: Two times a day (BID) | ORAL | 0 refills | Status: DC

## 2017-01-29 NOTE — Progress Notes (Signed)
BP 117/78   Pulse 99   Temp 98.4 F (36.9 C) (Oral)   Wt 174 lb (78.9 kg)   SpO2 99%   BMI 31.93 kg/m    Subjective:    Patient ID: Carrie [name], female    DOB: 01/26/1991, 26 y.o.   MRN: [id number]  HPI: Carrie [name] is a 26 y.o. female  Chief Complaint  Patient presents with  . Insect Bite    Back/ Neck   Patient here with presumed insect bite on right side of neck. Went to UC initially and was started on bactrim for spider bite with abscess but states 4 days into tx area is burning and stinging worse and seems to be having blisters. Can barely move her neck the pain is so bad. No fevers, chills.   Also still having vaginal odor and discharge after completing flagyl course for BV. Denies urinary sxs, abdominal pain, N/V. Has started on probiotics.   Past Medical History:  Diagnosis Date  . Asthma   . Bipolar 2 disorder (HCC)   . Psychiatric disorder   . Vitamin D deficiency disease    Social History   Socioeconomic History  . Marital status: Married    Spouse name: Not on file  . Number of children: Not on file  . Years of education: Not on file  . Highest education level: Not on file  Social Needs  . Financial resource strain: Not on file  . Food insecurity - worry: Not on file  . Food insecurity - inability: Not on file  . Transportation needs - medical: Not on file  . Transportation needs - non-medical: Not on file  Occupational History  . Not on file  Tobacco Use  . Smoking status: Never Smoker  . Smokeless tobacco: Never Used  Substance and Sexual Activity  . Alcohol use: No  . Drug use: No  . Sexual activity: Yes    Birth control/protection: None, Condom  Other Topics Concern  . Not on file  Social History Narrative  . Not on file   Relevant past medical, surgical, family and social history reviewed and updated as indicated. Interim medical history since our last visit reviewed. Allergies and medications reviewed and updated.  Review of  Systems  Constitutional: Negative.   Respiratory: Negative.   Cardiovascular: Negative.   Gastrointestinal: Negative.   Genitourinary: Positive for vaginal discharge.  Musculoskeletal: Positive for neck pain.  Skin:       Large, painful area on right neck  Neurological: Negative.   Psychiatric/Behavioral: Negative.    Per HPI unless specifically indicated above     Objective:    BP 117/78   Pulse 99   Temp 98.4 F (36.9 C) (Oral)   Wt 174 lb (78.9 kg)   SpO2 99%   BMI 31.93 kg/m   Wt Readings from Last 3 Encounters:  01/29/17 174 lb (78.9 kg)  11/23/16 177 lb (80.3 kg)  09/21/16 164 lb (74.4 kg)    Physical Exam  Constitutional: She is oriented to person, place, and time. She appears well-developed and well-nourished. No distress.  HENT:  Head: Atraumatic.  Eyes: Conjunctivae are normal. Pupils are equal, round, and reactive to light. No scleral icterus.  Neck: Neck supple.  Cardiovascular: Normal rate, regular rhythm and normal heart sounds.  Pulmonary/Chest: Effort normal and breath sounds normal. No respiratory distress.  Abdominal:  Raised, erythematous patch on right lateral neck with vesicles. Significantly ttp  Musculoskeletal: Normal range of motion.  Neurological: She  is alert and oriented to person, place, and time.  Skin: Skin is warm and dry.  Psychiatric: She has a normal mood and affect. Her behavior is normal.  Nursing note and vitals reviewed.  Results for orders placed or performed in visit on 11/23/16  WET PREP FOR TRICH, YEAST, CLUE  Result Value Ref Range   Trichomonas Exam Negative Negative   Yeast Exam Negative Negative   Clue Cell Exam Positive (A) Negative      Assessment & Plan:   Problem List Items Addressed This Visit    None    Visit Diagnoses    Herpes zoster without complication    -  Primary   Area more consistent with shingles than insect bite. Will start prednisone and valtrex, contact precautions. Clindamycin should cover  any infection.    Relevant Medications   valACYclovir (VALTREX) 1000 MG tablet   clindamycin (CLEOCIN) 300 MG capsule   BV (bacterial vaginosis)       Will start a course of clindamycin as well as topical flagyl. Continue probiotics, good vaginal hygiene, avoid sexual contact until tx completed   Relevant Medications   valACYclovir (VALTREX) 1000 MG tablet   clindamycin (CLEOCIN) 300 MG capsule       Follow up plan: Return if symptoms worsen or fail to improve.

## 2017-01-31 NOTE — Patient Instructions (Addendum)
Follow up as needed

## 2017-04-13 ENCOUNTER — Ambulatory Visit: Payer: Self-pay | Admitting: *Deleted

## 2017-04-13 ENCOUNTER — Ambulatory Visit: Payer: BLUE CROSS/BLUE SHIELD | Admitting: Family Medicine

## 2017-04-13 NOTE — Telephone Encounter (Signed)
Called in c/o having a shingles outbreak that began a week ago on her chest.    It is itching and burning.   It's also clumping up in clusters.   She had shingles 2 months ago.  She was wondering if Carrie [name] could call in a Rx for her since she just had the shingles.    She is also requesting "the pill for vaginal yeast infection" she always get after taking antibiotics.  I have routed a high priority note to Carrie [name]'s nurse pool making her aware of the situation.  I let the pt know someone would be getting in touch with her from the office.   She verbalized understanding and agreed to this plan.  (She also needs a work note).   Is that something she would need to come pick up?    Thanks.  Reason for Disposition . [1] Shingles rash AND [2] onset > 72 hours ago  Answer Assessment - Initial Assessment Questions 1. APPEARANCE of RASH: "Describe the rash."      Shingles on your chest.  I had shingles before. 2. LOCATION: "Where is the rash located?"      Just 1 place on my chest. 3. ONSET: "When did the rash start?"      I had it for a week now.   I thought it would go away but now it's clumping up and itching real bad.   Also burning.   A shower makes it burn and scratching hurts.    Need yeast infection tablets too. 4. ITCHING: "Does the rash itch?" If so, ask: "How bad is the itch?"  (Scale 1-10; or mild, moderate, severe)     Yes 5. PAIN: "Does the rash hurt?" If so, ask: "How bad is the pain?"  (Scale 1-10; or mild, moderate, severe)     Yes   burns 6. OTHER SYMPTOMS: "Do you have any other symptoms?" (e.g., fever)     Fever off and on.   Been taking Ibuprofen.    7. PREGNANCY: "Is there any chance you are pregnant?" "When was your last menstrual period?"     I have not had my period yet this month.  Protocols used: [facility]

## 2017-04-13 NOTE — Telephone Encounter (Signed)
Needs appt, esp since she's needing documentation for work

## 2017-04-13 NOTE — Telephone Encounter (Signed)
Scheduled patient to see Dr. Laural BenesJohnson Monday at 11:15am.

## 2017-04-13 NOTE — Telephone Encounter (Signed)
Contacted patient.   Stated she had a rash on her chest. Explained we were booked today and only had 2 providers. I explained Fleet ContrasRachel had an 8:30 Monday but she can't see rashes because she's pregnant. I urged patient to go to urgent care because they can provide a work note as well and patient became stern and stated "no, I am not going back there, all they said was it was a spider bite." I explained to patient that if the rash is spreading and causing symptoms it would be better to be seen sooner than later and there were other urgent cares. Patient asked when she could be seen and I explained I could fit her in with Dr. Laural BenesJohnson on Tuesday at 8:30, but she was booked on Monday. Patient became very angry with me and said "You know what, screw this. This is ridiculous. I'm going to find another doctor. Bye." I tried to ask patient to hold on so she could speak to [place], our Production designer, theatre/television/filmmanager and patient hung up the phone.  Routing to [place].

## 2017-04-16 ENCOUNTER — Ambulatory Visit (INDEPENDENT_AMBULATORY_CARE_PROVIDER_SITE_OTHER): Payer: BLUE CROSS/BLUE SHIELD | Admitting: Family Medicine

## 2017-04-16 ENCOUNTER — Encounter: Payer: Self-pay | Admitting: Family Medicine

## 2017-04-16 VITALS — BP 116/77 | HR 77 | Temp 98.4°F | Wt 182.4 lb

## 2017-04-16 DIAGNOSIS — N898 Other specified noninflammatory disorders of vagina: Secondary | ICD-10-CM | POA: Diagnosis not present

## 2017-04-16 DIAGNOSIS — N760 Acute vaginitis: Secondary | ICD-10-CM

## 2017-04-16 DIAGNOSIS — B029 Zoster without complications: Secondary | ICD-10-CM

## 2017-04-16 DIAGNOSIS — B9689 Other specified bacterial agents as the cause of diseases classified elsewhere: Secondary | ICD-10-CM | POA: Diagnosis not present

## 2017-04-16 DIAGNOSIS — N912 Amenorrhea, unspecified: Secondary | ICD-10-CM

## 2017-04-16 LAB — WET PREP FOR TRICH, YEAST, CLUE
CLUE CELL EXAM: POSITIVE — AB
Trichomonas Exam: NEGATIVE
Yeast Exam: NEGATIVE

## 2017-04-16 MED ORDER — METRONIDAZOLE 500 MG PO TABS: 500.0000 mg | ORAL_TABLET | Freq: Two times a day (BID) | ORAL | 0 refills | Status: DC

## 2017-04-16 MED ORDER — METRONIDAZOLE 0.75 % VA GEL: VAGINAL | 3 refills | Status: DC

## 2017-04-16 MED ORDER — VALACYCLOVIR HCL 1 G PO TABS: 1000.0000 mg | ORAL_TABLET | Freq: Two times a day (BID) | ORAL | 0 refills | Status: DC

## 2017-04-16 NOTE — Progress Notes (Signed)
BP 116/77 (BP Location: Left Arm, Patient Position: Sitting, Cuff Size: Large)   Pulse 77   Temp 98.4 F (36.9 C)   Wt 182 lb 6 oz (82.7 kg)   SpO2 99%   BMI 33.46 kg/m    Subjective:    Patient ID: Carrie [name], female    DOB: 06-May-1990, 27 y.o.   MRN: [id number]  HPI: Carrie [name] is a 27 y.o. female  Chief Complaint  Patient presents with  . Rash   RASH Duration:  1 week  Location: chest  Itching: yes Burning: yes Redness: yes Oozing: no Scaling: no Blisters: yes Painful: no Fevers: no Change in detergents/soaps/personal care products: no Recent illness: no Recent travel:no History of same: yes Context: better Alleviating factors: nothing Treatments attempted:nothing Shortness of breath: no  Throat/tongue swelling: no Myalgias/arthralgias: no   VAGINAL DISCHARGE Duration: months Discharge description: brown  Pruritus: yes Dysuria: no Malodorous: yes Urinary frequency: yes Fevers: no Abdominal pain: yes  Sexual activity: monogamous History of sexually transmitted diseases: no Recent antibiotic use: no Context: recurrent BV  Treatments attempted: none  Had a period last month, but concerned that she might be pregnant. Has taken 2 pregnancy tests at home, but were negative. Has gained about 8lbs and is concerned that she might be pregnant.  Relevant past medical, surgical, family and social history reviewed and updated as indicated. Interim medical history since our last visit reviewed. Allergies and medications reviewed and updated.  Review of Systems  Constitutional: Negative.   Respiratory: Negative.   Cardiovascular: Negative.   Genitourinary: Positive for vaginal discharge. Negative for decreased urine volume, difficulty urinating, dyspareunia, dysuria, enuresis, flank pain, frequency, genital sores, hematuria, menstrual problem, pelvic pain, urgency, vaginal bleeding and vaginal pain.  Skin: Positive for rash. Negative for color change,  pallor and wound.  Psychiatric/Behavioral: Negative.     Per HPI unless specifically indicated above     Objective:    BP 116/77 (BP Location: Left Arm, Patient Position: Sitting, Cuff Size: Large)   Pulse 77   Temp 98.4 F (36.9 C)   Wt 182 lb 6 oz (82.7 kg)   SpO2 99%   BMI 33.46 kg/m   Wt Readings from Last 3 Encounters:  04/16/17 182 lb 6 oz (82.7 kg)  01/29/17 174 lb (78.9 kg)  11/23/16 177 lb (80.3 kg)    Physical Exam  Constitutional: She is oriented to person, place, and time. She appears well-developed and well-nourished. No distress.  HENT:  Head: Normocephalic and atraumatic.  Right Ear: Hearing normal.  Left Ear: Hearing normal.  Nose: Nose normal.  Eyes: Conjunctivae and lids are normal. Right eye exhibits no discharge. Left eye exhibits no discharge. No scleral icterus.  Cardiovascular: Normal rate, regular rhythm, normal heart sounds and intact distal pulses. Exam reveals no gallop and no friction rub.  No murmur heard. Pulmonary/Chest: Effort normal and breath sounds normal. No respiratory distress. She has no wheezes. She has no rales. She exhibits no tenderness.  Abdominal: Hernia confirmed negative in the right inguinal area and confirmed negative in the left inguinal area.  Genitourinary: No labial fusion. There is no rash, tenderness, lesion or injury on the right labia. There is no rash, tenderness, lesion or injury on the left labia. There is bleeding in the vagina. No erythema or tenderness in the vagina. No foreign body in the vagina. No signs of injury around the vagina. Vaginal discharge found.  Musculoskeletal: Normal range of motion.  Neurological: She is alert and  oriented to person, place, and time.  Skin: Skin is warm, dry and intact. Rash noted. She is not diaphoretic. No erythema. No pallor.     Psychiatric: She has a normal mood and affect. Her speech is normal and behavior is normal. Judgment and thought content normal. Cognition and memory  are normal.    Results for orders placed or performed in visit on 11/23/16  WET PREP FOR TRICH, YEAST, CLUE  Result Value Ref Range   Trichomonas Exam Negative Negative   Yeast Exam Negative Negative   Clue Cell Exam Positive (A) Negative      Assessment & Plan:   Problem List Items Addressed This Visit    None    Visit Diagnoses    Herpes zoster without complication    -  Primary   Seems to be drying up. Will treat with valtrex. Call with any concerns.    Relevant Medications   valACYclovir (VALTREX) 1000 MG tablet   metroNIDAZOLE (FLAGYL) 500 MG tablet   Amenorrhea       Blood on vaginal exam today. Would like to hold on pregnancy tests.    Vaginal discharge       + clue cells   Relevant Orders   WET PREP FOR TRICH, YEAST, CLUE   BV (bacterial vaginosis)       Will treat with flagyl followed by suppressive treatment with 2x a week metrogel. Call with any concerns.    Relevant Medications   valACYclovir (VALTREX) 1000 MG tablet   metroNIDAZOLE (FLAGYL) 500 MG tablet       Follow up plan: Return if symptoms worsen or fail to improve.

## 2017-06-08 ENCOUNTER — Ambulatory Visit (INDEPENDENT_AMBULATORY_CARE_PROVIDER_SITE_OTHER): Payer: BLUE CROSS/BLUE SHIELD | Admitting: Family Medicine

## 2017-06-08 ENCOUNTER — Encounter: Payer: Self-pay | Admitting: Family Medicine

## 2017-06-08 VITALS — BP 113/82 | HR 83 | Temp 98.5°F | Wt 179.2 lb

## 2017-06-08 DIAGNOSIS — B9689 Other specified bacterial agents as the cause of diseases classified elsewhere: Secondary | ICD-10-CM

## 2017-06-08 DIAGNOSIS — N760 Acute vaginitis: Secondary | ICD-10-CM | POA: Diagnosis not present

## 2017-06-08 DIAGNOSIS — Z3200 Encounter for pregnancy test, result unknown: Secondary | ICD-10-CM | POA: Diagnosis not present

## 2017-06-08 DIAGNOSIS — N898 Other specified noninflammatory disorders of vagina: Secondary | ICD-10-CM | POA: Diagnosis not present

## 2017-06-08 LAB — WET PREP FOR TRICH, YEAST, CLUE
Clue Cell Exam: POSITIVE — AB
TRICHOMONAS EXAM: NEGATIVE
Yeast Exam: NEGATIVE

## 2017-06-08 MED ORDER — METRONIDAZOLE 500 MG PO TABS: 500.0000 mg | ORAL_TABLET | Freq: Two times a day (BID) | ORAL | 0 refills | Status: DC

## 2017-06-08 NOTE — Progress Notes (Signed)
BP 113/82   Pulse 83   Temp 98.5 F (36.9 C) (Oral)   Wt 179 lb 3.2 oz (81.3 kg)   SpO2 100%   BMI 32.88 kg/m    Subjective:    Patient ID: Carrie [name], female    DOB: 17-Sep-1990, 27 y.o.   MRN: [id number]  HPI: Carrie [name] is a 27 y.o. female  Chief Complaint  Patient presents with  . Vaginal Itching    pt states she still has vaginal itching, states she does not think she ever picked up the flagyl prescribed 04/16/17   Pt here today with persistent vaginal itching, irritation, and brownish discharge for several months. Has been dx'd and treated for BV multiple times in the last 6 months, most recently 3/11 and thinks she may have forgotten to get her flagyl for that episode. Denies rashes, fevers, N/V/D, concern for STIs.   Also concerned as she's been trying to conceive x 2 months without success. Has been using an app to track her cycles but otherwise has not been trying anything. Wanting to discuss what else she can be doing to become pregnant. Has had 2 surprise pregnancies in the past, never had to do anything in particular to become pregnant. Due for next period in the next 4 days or so.   Past Medical History:  Diagnosis Date  . Asthma   . Bipolar 2 disorder (HCC)   . Psychiatric disorder   . Vitamin D deficiency disease    Social History   Socioeconomic History  . Marital status: Married    Spouse name: Not on file  . Number of children: Not on file  . Years of education: Not on file  . Highest education level: Not on file  Occupational History  . Not on file  Social Needs  . Financial resource strain: Not on file  . Food insecurity:    Worry: Not on file    Inability: Not on file  . Transportation needs:    Medical: Not on file    Non-medical: Not on file  Tobacco Use  . Smoking status: Never Smoker  . Smokeless tobacco: Never Used  Substance and Sexual Activity  . Alcohol use: No  . Drug use: No  . Sexual activity: Yes    Birth  control/protection: None, Condom  Lifestyle  . Physical activity:    Days per week: Not on file    Minutes per session: Not on file  . Stress: Not on file  Relationships  . Social connections:    Talks on phone: Not on file    Gets together: Not on file    Attends religious service: Not on file    Active member of club or organization: Not on file    Attends meetings of clubs or organizations: Not on file    Relationship status: Not on file  . Intimate partner violence:    Fear of current or ex partner: Not on file    Emotionally abused: Not on file    Physically abused: Not on file    Forced sexual activity: Not on file  Other Topics Concern  . Not on file  Social History Narrative  . Not on file   Relevant past medical, surgical, family and social history reviewed and updated as indicated. Interim medical history since our last visit reviewed. Allergies and medications reviewed and updated.  Review of Systems  Per HPI unless specifically indicated above     Objective:  BP 113/82   Pulse 83   Temp 98.5 F (36.9 C) (Oral)   Wt 179 lb 3.2 oz (81.3 kg)   SpO2 100%   BMI 32.88 kg/m   Wt Readings from Last 3 Encounters:  06/08/17 179 lb 3.2 oz (81.3 kg)  04/16/17 182 lb 6 oz (82.7 kg)  01/29/17 174 lb (78.9 kg)    Physical Exam  Constitutional: She is oriented to person, place, and time. She appears well-developed and well-nourished. No distress.  HENT:  Head: Atraumatic.  Eyes: Pupils are equal, round, and reactive to light. Conjunctivae and EOM are normal.  Neck: Normal range of motion. Neck supple.  Cardiovascular: Normal rate and regular rhythm.  Pulmonary/Chest: Effort normal and breath sounds normal. No respiratory distress.  Abdominal: Soft. Bowel sounds are normal. There is no tenderness.  Genitourinary: Vaginal discharge found.  Genitourinary Comments: Normal vaginal mucosa White/brown discharge present  Musculoskeletal: Normal range of motion. She  exhibits no tenderness (No CVA tenderness b/l).  Neurological: She is alert and oriented to person, place, and time.  Skin: Skin is warm and dry.  Psychiatric: She has a normal mood and affect. Her behavior is normal.  Nursing note and vitals reviewed.   Results for orders placed or performed in visit on 06/08/17  WET PREP FOR TRICH, YEAST, CLUE  Result Value Ref Range   Trichomonas Exam Negative Negative   Yeast Exam Negative Negative   Clue Cell Exam Positive (A) Negative      Assessment & Plan:   Problem List Items Addressed This Visit    None    Visit Diagnoses    BV (bacterial vaginosis)    -  Primary   Wet prep still pos for BV. Restart vaginal suppositories, start oral flagyl. Discussed probiotics, boric acid, avoiding soaps or other products   Relevant Medications   metroNIDAZOLE (FLAGYL) 500 MG tablet   Other Relevant Orders   WET PREP FOR TRICH, YEAST, CLUE (Completed)   Possible pregnancy, not yet confirmed       Pt wanting serum preg over the next few days after expected period. Discussed monitoring ovulation and temps, treating BV to help make more favorable pH climate   Relevant Orders   Beta hCG quant (ref lab)    Future order placed for beta hcg, discussed with pt if she misses her upcoming period next week she can come in for blood check but it would be too early now to check anything.    Follow up plan: Return in about 2 weeks (around 06/22/2017) for BV recheck.

## 2017-06-08 NOTE — Patient Instructions (Addendum)
Probiotics with lactobacillus  Boric acid supplements NO SOAP DOWN THERE

## 2017-06-21 ENCOUNTER — Ambulatory Visit: Payer: BLUE CROSS/BLUE SHIELD | Admitting: Family Medicine

## 2017-08-03 ENCOUNTER — Encounter: Payer: Self-pay | Admitting: Physician Assistant

## 2017-08-03 ENCOUNTER — Ambulatory Visit (INDEPENDENT_AMBULATORY_CARE_PROVIDER_SITE_OTHER): Payer: BLUE CROSS/BLUE SHIELD | Admitting: Physician Assistant

## 2017-08-03 VITALS — BP 113/81 | HR 70 | Temp 98.3°F | Ht 64.0 in | Wt 179.5 lb

## 2017-08-03 DIAGNOSIS — B9689 Other specified bacterial agents as the cause of diseases classified elsewhere: Secondary | ICD-10-CM | POA: Diagnosis not present

## 2017-08-03 DIAGNOSIS — Z3009 Encounter for other general counseling and advice on contraception: Secondary | ICD-10-CM | POA: Diagnosis not present

## 2017-08-03 DIAGNOSIS — N760 Acute vaginitis: Secondary | ICD-10-CM | POA: Diagnosis not present

## 2017-08-03 DIAGNOSIS — J45909 Unspecified asthma, uncomplicated: Secondary | ICD-10-CM

## 2017-08-03 DIAGNOSIS — O99519 Diseases of the respiratory system complicating pregnancy, unspecified trimester: Secondary | ICD-10-CM | POA: Diagnosis not present

## 2017-08-03 DIAGNOSIS — T148XXA Other injury of unspecified body region, initial encounter: Secondary | ICD-10-CM

## 2017-08-03 DIAGNOSIS — M791 Myalgia, unspecified site: Secondary | ICD-10-CM | POA: Diagnosis not present

## 2017-08-03 MED ORDER — ALBUTEROL SULFATE HFA 108 (90 BASE) MCG/ACT IN AERS: 1.0000 | INHALATION_SPRAY | Freq: Four times a day (QID) | RESPIRATORY_TRACT | 1 refills | Status: DC | PRN

## 2017-08-03 MED ORDER — PRETAB 29-1 MG PO TABS: 1.0000 | ORAL_TABLET | Freq: Every day | ORAL | 1 refills | Status: DC

## 2017-08-03 NOTE — Patient Instructions (Signed)
Muscle Strain A muscle strain (pulled muscle) happens when a muscle is stretched beyond normal length. It happens when a sudden, violent force stretches your muscle too far. Usually, a few of the fibers in your muscle are torn. Muscle strain is common in athletes. Recovery usually takes 1-2 weeks. Complete healing takes 5-6 weeks. Follow these instructions at home:  Follow the PRICE method of treatment to help your injury get better. Do this the first 2-3 days after the injury: ? Protect. Protect the muscle to keep it from getting injured again. ? Rest. Limit your activity and rest the injured body part. ? Ice. Put ice in a plastic bag. Place a towel between your skin and the bag. Then, apply the ice and leave it on from 15-20 minutes each hour. After the third day, switch to moist heat packs. ? Compression. Use a splint or elastic bandage on the injured area for comfort. Do not put it on too tightly. ? Elevate. Keep the injured body part above the level of your heart.  Only take medicine as told by your doctor.  Warm up before doing exercise to prevent future muscle strains. Contact a doctor if:  You have more pain or puffiness (swelling) in the injured area.  You feel numbness, tingling, or notice a loss of strength in the injured area. This information is not intended to replace advice given to you by your health care provider. Make sure you discuss any questions you have with your health care provider. Document Released: 11/02/2007 Document Revised: 07/01/2015 Document Reviewed: 08/22/2012 Elsevier Interactive Patient Education  2017 Elsevier Inc.  

## 2017-08-03 NOTE — Progress Notes (Signed)
   Subjective:    Patient ID: Carrie JacobsKristen [name], female    DOB: 01/24/1991, 27 y.o.   MRN: [id number]  Carrie JacobsKristen [name] is a 27 y.o. female presenting on 08/03/2017 for Muscle Strain (Back, needs doctor note.)   HPI   [facility] Prescribed Diclofenac and muscle relaxers. Denies pregnancy. She needs a work note for back strain. Strained it due to lifting box, works at [employer]. In low back, worsened with bending and twisting.   Also needs refill for asthma. Albuterol controls wheezing.   Concerned about recurrent BV. Has used gel in the past but infections keep recurring. Currently TTC. Does not have local OBGYN.   Social History   Tobacco Use  . Smoking status: Never Smoker  . Smokeless tobacco: Never Used  Substance Use Topics  . Alcohol use: No  . Drug use: No    Review of Systems Per HPI unless specifically indicated above     Objective:    BP 113/81 (BP Location: Right Arm, Patient Position: Sitting, Cuff Size: Large)   Pulse 70   Temp 98.3 F (36.8 C) (Oral)   Ht 5\' 4"  (1.626 m)   Wt 179 lb 8 oz (81.4 kg)   SpO2 98%   BMI 30.81 kg/m   Wt Readings from Last 3 Encounters:  08/03/17 179 lb 8 oz (81.4 kg)  06/08/17 179 lb 3.2 oz (81.3 kg)  04/16/17 182 lb 6 oz (82.7 kg)    Physical Exam  Constitutional: She is oriented to person, place, and time. She appears well-developed and well-nourished.  Cardiovascular: Normal rate.  Pulmonary/Chest: Effort normal.  Musculoskeletal: She exhibits no edema.       Lumbar back: She exhibits pain.  Neurological: She is alert and oriented to person, place, and time.  Skin: Skin is warm and dry.  Psychiatric: She has a normal mood and affect. Her behavior is normal.   Results for orders placed or performed in visit on 06/08/17  WET PREP FOR TRICH, YEAST, CLUE  Result Value Ref Range   Trichomonas Exam Negative Negative   Yeast Exam Negative Negative   Clue Cell Exam Positive (A) Negative      Assessment & Plan:   1.  Muscle strain   2. Bacterial vaginosis  Recurrent, will refer to OBGYN. Also trying to conceive, counseled on prenatal vitamin.  - Ambulatory referral to Obstetrics / Gynecology  3. Asthma affecting pregnancy, antepartum  - albuterol (PROVENTIL HFA;VENTOLIN HFA) 108 (90 Base) MCG/ACT inhaler; Inhale 1 puff into the lungs every 6 (six) hours as needed for wheezing or shortness of breath.  Dispense: 6.7 g; Refill: 1  4. Family planning  - Prenatal Vit-Fe Fumarate-FA (PRETAB) 29-1 MG TABS; Take 1 tablet by mouth daily.  Dispense: 90 tablet; Refill: 1    Follow up plan: Return if symptoms worsen or fail to improve.  Osvaldo AngstAdriana Pollak, PA-C [facility] 08/03/2017, 11:48 AM

## 2017-08-07 ENCOUNTER — Telehealth: Payer: Self-pay | Admitting: Obstetrics & Gynecology

## 2017-08-07 NOTE — Telephone Encounter (Signed)
[facility] referring for Bacterial vaginosis.Attempt to reach patient to schedule appointment, Patient number number isn't excepting calls at this time. Not way to leave message for patient.

## 2017-08-08 NOTE — Telephone Encounter (Signed)
Attempt to reach patient to schedule appointment, Patient number number isn't excepting calls at this time.

## 2017-08-15 NOTE — Telephone Encounter (Signed)
Attempt to reach patient to schedule appointment, Patient number number isn't excepting calls at this time.

## 2017-08-22 ENCOUNTER — Other Ambulatory Visit: Payer: Self-pay

## 2017-08-22 ENCOUNTER — Ambulatory Visit (INDEPENDENT_AMBULATORY_CARE_PROVIDER_SITE_OTHER): Payer: BLUE CROSS/BLUE SHIELD | Admitting: Family Medicine

## 2017-08-22 ENCOUNTER — Encounter: Payer: Self-pay | Admitting: Family Medicine

## 2017-08-22 VITALS — BP 105/75 | HR 76 | Temp 97.9°F | Ht 64.0 in | Wt 175.0 lb

## 2017-08-22 DIAGNOSIS — B9689 Other specified bacterial agents as the cause of diseases classified elsewhere: Secondary | ICD-10-CM | POA: Diagnosis not present

## 2017-08-22 DIAGNOSIS — B9789 Other viral agents as the cause of diseases classified elsewhere: Secondary | ICD-10-CM

## 2017-08-22 DIAGNOSIS — N760 Acute vaginitis: Secondary | ICD-10-CM | POA: Diagnosis not present

## 2017-08-22 DIAGNOSIS — J329 Chronic sinusitis, unspecified: Secondary | ICD-10-CM | POA: Diagnosis not present

## 2017-08-22 MED ORDER — SECNIDAZOLE 2 G PO PACK: 1.0000 | PACK | Freq: Once | ORAL | 0 refills | Status: AC

## 2017-08-22 MED ORDER — PREDNISONE 10 MG PO TABS: 10.0000 mg | ORAL_TABLET | Freq: Every day | ORAL | 0 refills | Status: DC

## 2017-08-22 NOTE — Patient Instructions (Signed)
[email or web address]  Secnidazole

## 2017-08-22 NOTE — Progress Notes (Signed)
BP 105/75   Pulse 76   Temp 97.9 F (36.6 C) (Oral)   Ht 5\' 4"  (1.626 m)   Wt 175 lb (79.4 kg)   SpO2 97%   BMI 30.04 kg/m    Subjective:    Patient ID: Carrie [name], female    DOB: [birth year], 27 y.o.   MRN: [id number]  HPI: Carrie [name] is a 27 y.o. female  Chief Complaint  Patient presents with  . Generalized Body Aches    x 1 week/ fever, chills since last night   Pt here with 1 week of malaise, generalized aches, and fever/chills last night. Now also having some congestion and sinus pressure. Denies  sore throat, Cp, SOB, N/V/D, rashes. No recent insect bites or outdoor exposures, travel, new foods, sick contacts. Not trying anything for sxs.   Still having recurrent BV sxs. Has upcoming appt with GYN for eval. Becoming very frustrated with duration of sxs. Discharge, odor, irritation. Has been treated in the past with both oral and topical flagyl many times.   Past Medical History:  Diagnosis Date  . Asthma   . Bipolar 2 disorder (HCC)   . Psychiatric disorder   . Vitamin D deficiency disease    Social History   Socioeconomic History  . Marital status: Married    Spouse name: Not on file  . Number of children: Not on file  . Years of education: Not on file  . Highest education level: Not on file  Occupational History  . Not on file  Social Needs  . Financial resource strain: Not on file  . Food insecurity:    Worry: Not on file    Inability: Not on file  . Transportation needs:    Medical: Not on file    Non-medical: Not on file  Tobacco Use  . Smoking status: Never Smoker  . Smokeless tobacco: Never Used  Substance and Sexual Activity  . Alcohol use: No  . Drug use: No  . Sexual activity: Yes    Birth control/protection: None, Condom  Lifestyle  . Physical activity:    Days per week: Not on file    Minutes per session: Not on file  . Stress: Not on file  Relationships  . Social connections:    Talks on phone: Not on file    Gets  together: Not on file    Attends religious service: Not on file    Active member of club or organization: Not on file    Attends meetings of clubs or organizations: Not on file    Relationship status: Not on file  . Intimate partner violence:    Fear of current or ex partner: Not on file    Emotionally abused: Not on file    Physically abused: Not on file    Forced sexual activity: Not on file  Other Topics Concern  . Not on file  Social History Narrative  . Not on file    Relevant past medical, surgical, family and social history reviewed and updated as indicated. Interim medical history since our last visit reviewed. Allergies and medications reviewed and updated.  Review of Systems  Per HPI unless specifically indicated above     Objective:    BP 105/75   Pulse 76   Temp 97.9 F (36.6 C) (Oral)   Ht 5\' 4"  (1.626 m)   Wt 175 lb (79.4 kg)   SpO2 97%   BMI 30.04 kg/m   Wt Readings from Last 3  Encounters:  08/22/17 175 lb (79.4 kg)  08/03/17 179 lb 8 oz (81.4 kg)  06/08/17 179 lb 3.2 oz (81.3 kg)    Physical Exam  Constitutional: She is oriented to person, place, and time. She appears well-developed and well-nourished. No distress.  HENT:  Head: Atraumatic.  Eyes: Pupils are equal, round, and reactive to light. Conjunctivae are normal.  Neck: Normal range of motion. Neck supple.  Cardiovascular: Normal rate, regular rhythm and normal heart sounds.  Pulmonary/Chest: Effort normal and breath sounds normal.  Abdominal: Soft. Bowel sounds are normal. There is no tenderness.  Musculoskeletal: Normal range of motion.  Lymphadenopathy:    She has no cervical adenopathy.  Neurological: She is alert and oriented to person, place, and time. No cranial nerve deficit.  Skin: Skin is warm and dry.  Psychiatric: She has a normal mood and affect. Her behavior is normal.  Nursing note and vitals reviewed.   Results for orders placed or performed in visit on 06/08/17  WET  PREP FOR TRICH, YEAST, CLUE  Result Value Ref Range   Trichomonas Exam Negative Negative   Yeast Exam Negative Negative   Clue Cell Exam Positive (A) Negative      Assessment & Plan:   Problem List Items Addressed This Visit    None    Visit Diagnoses    BV (bacterial vaginosis)    -  Primary   Recurrent x over a year. Scheduled to see GYN soon, will try secnidazole in meantime in addition to probiotics and hygiene measures   Viral sinusitis       Tx with prednisone, sinus rinses, mucinex. F/u if worsening or no improvement   Relevant Medications   predniSONE (DELTASONE) 10 MG tablet       Follow up plan: Return if symptoms worsen or fail to improve.

## 2017-08-24 DIAGNOSIS — Z6833 Body mass index (BMI) 33.0-33.9, adult: Secondary | ICD-10-CM | POA: Diagnosis not present

## 2017-08-24 DIAGNOSIS — Z01419 Encounter for gynecological examination (general) (routine) without abnormal findings: Secondary | ICD-10-CM | POA: Diagnosis not present

## 2017-08-29 ENCOUNTER — Encounter: Payer: Self-pay | Admitting: Family Medicine

## 2017-08-29 ENCOUNTER — Ambulatory Visit (INDEPENDENT_AMBULATORY_CARE_PROVIDER_SITE_OTHER): Payer: BLUE CROSS/BLUE SHIELD | Admitting: Family Medicine

## 2017-08-29 ENCOUNTER — Other Ambulatory Visit: Payer: Self-pay

## 2017-08-29 VITALS — BP 108/75 | HR 86 | Temp 98.9°F | Ht 61.0 in | Wt 174.5 lb

## 2017-08-29 DIAGNOSIS — M791 Myalgia, unspecified site: Secondary | ICD-10-CM

## 2017-08-29 DIAGNOSIS — R509 Fever, unspecified: Secondary | ICD-10-CM

## 2017-08-29 DIAGNOSIS — G43909 Migraine, unspecified, not intractable, without status migrainosus: Secondary | ICD-10-CM

## 2017-08-29 MED ORDER — KETOROLAC TROMETHAMINE 60 MG/2ML IM SOLN
60.0000 mg | Freq: Once | INTRAMUSCULAR | Status: AC
  Administered 2017-08-29: 60 mg via INTRAMUSCULAR

## 2017-08-29 MED ORDER — SUMATRIPTAN SUCCINATE 6 MG/0.5ML ~~LOC~~ SOLN
6.0000 mg | Freq: Once | SUBCUTANEOUS | Status: AC
  Administered 2017-08-29: 6 mg via SUBCUTANEOUS

## 2017-08-29 MED ORDER — PROMETHAZINE HCL 25 MG PO TABS: 25.0000 mg | ORAL_TABLET | Freq: Three times a day (TID) | ORAL | 0 refills | Status: DC | PRN

## 2017-08-29 MED ORDER — DOXYCYCLINE HYCLATE 100 MG PO TABS: 100.0000 mg | ORAL_TABLET | Freq: Two times a day (BID) | ORAL | 0 refills | Status: DC

## 2017-08-29 NOTE — Progress Notes (Signed)
   BP 108/75   Pulse 86   Temp 98.9 F (37.2 C) (Oral)   Ht 5\' 1"  (1.549 m)   Wt 174 lb 8 oz (79.2 kg)   SpO2 97%   BMI 32.97 kg/m    Subjective:    Patient ID: Carrie JacobsKristen [name], female    DOB: 07/16/1990, 27 y.o.   MRN: [id number]  HPI: Carrie JacobsKristen [name] is a 27 y.o. female  Chief Complaint  Patient presents with  . Fever    symptoms ongoing since last Sunday  . Headache  . Chills   Pt here for some persistent intermittent fevers, chills, headaches, back aches. The headache is now worse, with phonophobia, photophobia, and sharp pain on the right side. She thinks a lot of it is exhaustion from her very physical job at [employer]. Has been taking OTC pain relievers prn with some relief. Got some relief from the prednisone she just completed. Denies CP, SOB, syncope, productive cough, urinary sxs.   Relevant past medical, surgical, family and social history reviewed and updated as indicated. Interim medical history since our last visit reviewed. Allergies and medications reviewed and updated.  Review of Systems  Per HPI unless specifically indicated above     Objective:    BP 108/75   Pulse 86   Temp 98.9 F (37.2 C) (Oral)   Ht 5\' 1"  (1.549 m)   Wt 174 lb 8 oz (79.2 kg)   SpO2 97%   BMI 32.97 kg/m   Wt Readings from Last 3 Encounters:  08/29/17 174 lb 8 oz (79.2 kg)  08/22/17 175 lb (79.4 kg)  08/03/17 179 lb 8 oz (81.4 kg)    Physical Exam  Constitutional: She is oriented to person, place, and time. She appears well-developed and well-nourished.  Laying on exam table with lights off  HENT:  Head: Atraumatic.  Eyes: Pupils are equal, round, and reactive to light. Conjunctivae are normal.  Neck: Normal range of motion. Neck supple.  Cardiovascular: Normal rate and normal heart sounds.  Pulmonary/Chest: Effort normal and breath sounds normal. She has no wheezes. She has no rales.  Abdominal: Soft. Bowel sounds are normal. There is no tenderness.  Musculoskeletal: Normal  range of motion.  Neurological: She is alert and oriented to person, place, and time. She has normal strength.  Skin: Skin is warm and dry.  Nursing note and vitals reviewed.  Results for orders placed or performed in visit on 06/08/17  WET PREP FOR TRICH, YEAST, CLUE  Result Value Ref Range   Trichomonas Exam Negative Negative   Yeast Exam Negative Negative   Clue Cell Exam Positive (A) Negative      Assessment & Plan:   Problem List Items Addressed This Visit    None    Visit Diagnoses    Migraine without status migrainosus, not intractable, unspecified migraine type    -  Primary   IM toradol and imitrex given today, rest, hydration. F/u if no improvement   Relevant Medications   ketorolac (TORADOL) injection 60 mg (Completed)   SUMAtriptan (IMITREX) injection 6 mg (Completed)   Myalgia       Fever, unspecified fever cause       Given persistent aches and fever, will start doxycycline in case infection/tick illness and monitor closely. OTC fever reducers and fluids recommended       Follow up plan: Return if symptoms worsen or fail to improve.

## 2017-08-30 ENCOUNTER — Encounter: Payer: Self-pay | Admitting: Family Medicine

## 2017-08-30 ENCOUNTER — Other Ambulatory Visit: Payer: Self-pay | Admitting: Family Medicine

## 2017-08-30 NOTE — Telephone Encounter (Signed)
Will you print and sign?

## 2017-08-31 NOTE — Patient Instructions (Signed)
Follow up as needed

## 2017-09-21 ENCOUNTER — Ambulatory Visit: Payer: Self-pay | Admitting: Family Medicine

## 2017-09-26 ENCOUNTER — Ambulatory Visit (INDEPENDENT_AMBULATORY_CARE_PROVIDER_SITE_OTHER): Payer: BLUE CROSS/BLUE SHIELD | Admitting: Family Medicine

## 2017-09-26 ENCOUNTER — Encounter: Payer: Self-pay | Admitting: Family Medicine

## 2017-09-26 VITALS — BP 110/77 | HR 92 | Temp 98.3°F | Wt 175.4 lb

## 2017-09-26 DIAGNOSIS — M545 Low back pain: Secondary | ICD-10-CM

## 2017-09-26 DIAGNOSIS — G8929 Other chronic pain: Secondary | ICD-10-CM

## 2017-09-26 NOTE — Progress Notes (Signed)
   BP 110/77 (BP Location: Right Arm, Patient Position: Sitting, Cuff Size: Normal)   Pulse 92   Temp 98.3 F (36.8 C) (Oral)   Wt 175 lb 6.4 oz (79.6 kg)   SpO2 99%   BMI 33.14 kg/m    Subjective:    Patient ID: Carrie [name], female    DOB: 01/01/1991, 27 y.o.   MRN: [id number]  HPI: Carrie JacobsKristen Barresi is a 27 y.o. female  Chief Complaint  Patient presents with  . Paper Work    Disability   Here today to discuss paperwork for her job to excuse her absence for her back pain. States she's taken off since Aug 5th and requested a full month to recover.    Relevant past medical, surgical, family and social history reviewed and updated as indicated. Interim medical history since our last visit reviewed. Allergies and medications reviewed and updated.  Review of Systems  Per HPI unless specifically indicated above     Objective:    BP 110/77 (BP Location: Right Arm, Patient Position: Sitting, Cuff Size: Normal)   Pulse 92   Temp 98.3 F (36.8 C) (Oral)   Wt 175 lb 6.4 oz (79.6 kg)   SpO2 99%   BMI 33.14 kg/m   Wt Readings from Last 3 Encounters:  09/26/17 175 lb 6.4 oz (79.6 kg)  08/29/17 174 lb 8 oz (79.2 kg)  08/22/17 175 lb (79.4 kg)    Physical Exam  Results for orders placed or performed in visit on 06/08/17  WET PREP FOR TRICH, YEAST, CLUE  Result Value Ref Range   Trichomonas Exam Negative Negative   Yeast Exam Negative Negative   Clue Cell Exam Positive (A) Negative      Assessment & Plan:   Problem List Items Addressed This Visit    None    Visit Diagnoses    Chronic low back pain, unspecified back pain laterality, with sciatica presence unspecified    -  Primary    Long discussion with pt regarding my ability to get her leave covered as I was unaware she was taking that time and do not have the supportive visits/documentation for such. Strongly recommended she file a workman's comp claim and be evaluated for that as she feels her back issues stem from  her job. Pt agreeable to this and will look into it.   Follow up plan: Return if symptoms worsen or fail to improve.

## 2017-11-13 ENCOUNTER — Telehealth: Payer: Self-pay | Admitting: Family Medicine

## 2017-11-13 NOTE — Telephone Encounter (Signed)
Copied from CRM [id number]. Topic: Quick Communication - See Telephone Encounter >> Nov 13, 2017  9:56 AM Windy Kalata, NT wrote: CRM for notification. See Telephone encounter for: 11/13/17.  Helmut Muster is calling from [employer] and is requesting office notes and work status for this patients workers comp. She states her first day of treatment was 08/31/17. Please advise.   Fax# [phone] Cb# [phone]

## 2017-11-14 NOTE — Telephone Encounter (Signed)
Tried to call patient, no answer. Left VM for pt to give [place] a call back.

## 2017-11-14 NOTE — Telephone Encounter (Signed)
OK to print and send relevant OV notes after patient signs a release for [place] to do so

## 2017-11-15 NOTE — Telephone Encounter (Signed)
Called and left Carrie [name] a voicemail requesting she fax [place] a signed release for OV notes.

## 2017-11-16 NOTE — Telephone Encounter (Signed)
Spoke with patient. She states she no longer works there to please disregard.

## 2018-08-07 ENCOUNTER — Encounter: Payer: Self-pay | Admitting: Family Medicine

## 2018-08-07 ENCOUNTER — Other Ambulatory Visit: Payer: Self-pay | Admitting: Family Medicine

## 2018-08-07 DIAGNOSIS — Z3009 Encounter for other general counseling and advice on contraception: Secondary | ICD-10-CM

## 2018-08-07 MED ORDER — PRETAB 29-1 MG PO TABS: 1.0000 | ORAL_TABLET | Freq: Every day | ORAL | 3 refills | Status: DC

## 2018-12-11 ENCOUNTER — Other Ambulatory Visit: Payer: Self-pay

## 2018-12-11 ENCOUNTER — Ambulatory Visit (INDEPENDENT_AMBULATORY_CARE_PROVIDER_SITE_OTHER): Payer: Medicaid Other | Admitting: Family Medicine

## 2018-12-11 ENCOUNTER — Encounter: Payer: Self-pay | Admitting: Family Medicine

## 2018-12-11 ENCOUNTER — Ambulatory Visit: Payer: Self-pay | Admitting: *Deleted

## 2018-12-11 VITALS — BP 145/102 | HR 90 | Temp 98.4°F | Wt 167.4 lb

## 2018-12-11 DIAGNOSIS — F339 Major depressive disorder, recurrent, unspecified: Secondary | ICD-10-CM | POA: Diagnosis not present

## 2018-12-11 DIAGNOSIS — F3181 Bipolar II disorder: Secondary | ICD-10-CM | POA: Diagnosis not present

## 2018-12-11 DIAGNOSIS — N926 Irregular menstruation, unspecified: Secondary | ICD-10-CM

## 2018-12-11 LAB — PREGNANCY, URINE: Preg Test, Ur: NEGATIVE

## 2018-12-11 MED ORDER — SERTRALINE HCL 25 MG PO TABS: 25.0000 mg | ORAL_TABLET | Freq: Every day | ORAL | 0 refills | Status: DC

## 2018-12-11 NOTE — Telephone Encounter (Addendum)
Summary: late period   Pt called and stated that her period is a week behind and would like to discuss with a nurse. Please advise      Left message to return call

## 2018-12-11 NOTE — Patient Instructions (Addendum)
Preparation H ointment over the counter for hemorrhoids Can try unisom at bedtime

## 2018-12-11 NOTE — Telephone Encounter (Signed)
  Reason for Disposition . Wants a pregnancy test done in the office  Answer Assessment - Initial Assessment Questions 1. LMP:  "When did your last menstrual period begin?"     11/05/18 2. DAYS LATE: "How many days late is your menstrual period?"     6 days 3. REGULARITY: "How regular are your periods?"     Very regular 4. PREGNANCY: "Is there any chance you are pregnant?" (e.g., unprotected intercourse, missed birth control pill, broken condom) "Have you used a home pregnancy test?"     Patient is trying for pregnancy 5. BREASTFEEDING: "Are you breastfeeding?"     no 6. BIRTH CONTROL PILLS: "Are you taking birth control pills, or have you stopped recently?"     no 7. DEPOPROVERA: "Has your doctor given you a shot to prevent pregnancy?" (e.g., Depoprovera injection)     no 8. CAUSE: "What do you think caused the missed period?" (e.g., stress, rapid weight loss, excessive exercise)     Possible pregnancy 9. OTHER SYMPTOMS: "Do you have any other symptoms?" (e.g., abdominal pain)     No appetite, rectal itching  Protocols used: MENSTRUAL PERIOD - MISSED OR LATE-A-AH

## 2018-12-11 NOTE — Progress Notes (Signed)
BP (!) 145/102   Pulse 90   Temp 98.4 F (36.9 C) (Oral)   Wt 167 lb 6.4 oz (75.9 kg)   LMP 11/07/2018 (Approximate)   BMI 31.63 kg/m    Subjective:    Patient ID: Carrie [name], female    DOB: March 10, 1990, 28 y.o.   MRN: [id number]  HPI: Carrie [name] is a 28 y.o. female  Chief Complaint  Patient presents with  . Amenorrhea    pt states she is 6 days late for her period, no home pregnancy test   Patient here today for evaluation as she is 6 days late for her period and is wondering if she could be pregnant. Has been trying to conceive for well over a year without success so far. Has not been hungry the past 2 weeks, only craving crab legs, and feels very tired like she did with her past 2 pregnancies. Denies N/V, breast soreness, bleeding/spotting/discharge. Not on contraception of any kind.   Feels like her moods are in a bad place right now, frequent crying spells, no motivation, no appetite. Feels very agitated all the time as well. Hx of ADHD and bipolar disorder, states she's never been treated by a Psychiatrist and has only ever tried one or two medicines. Was on adderall for ADHD but lost her appetite, was given abilify a few years ago and did not tolerate that well due to "feeling strange and also nauseated". Denies SI/HI.   Depression screen [facility] 2/9 04/16/2017 03/21/2016  Decreased Interest 0 1  Down, Depressed, Hopeless 1 3  PHQ - 2 Score 1 4  Altered sleeping 0 1  Tired, decreased energy 2 1  Change in appetite 2 0  Feeling bad or failure about yourself  0 0  Trouble concentrating 0 0  Moving slowly or fidgety/restless 0 0  Suicidal thoughts 0 0  PHQ-9 Score 5 6  Difficult doing work/chores Somewhat difficult Not difficult at all  No flowsheet data found.    Relevant past medical, surgical, family and social history reviewed and updated as indicated. Interim medical history since our last visit reviewed. Allergies and medications reviewed and updated.   Review of Systems  Per HPI unless specifically indicated above     Objective:    BP (!) 145/102   Pulse 90   Temp 98.4 F (36.9 C) (Oral)   Wt 167 lb 6.4 oz (75.9 kg)   LMP 11/07/2018 (Approximate)   BMI 31.63 kg/m   Wt Readings from Last 3 Encounters:  12/11/18 167 lb 6.4 oz (75.9 kg)  09/26/17 175 lb 6.4 oz (79.6 kg)  08/29/17 174 lb 8 oz (79.2 kg)    Physical Exam Vitals signs and nursing note reviewed.  Constitutional:      Appearance: Normal appearance. She is not ill-appearing.  HENT:     Head: Atraumatic.  Eyes:     Extraocular Movements: Extraocular movements intact.     Conjunctiva/sclera: Conjunctivae normal.  Neck:     Musculoskeletal: Normal range of motion and neck supple.  Cardiovascular:     Rate and Rhythm: Normal rate and regular rhythm.     Heart sounds: Normal heart sounds.  Pulmonary:     Effort: Pulmonary effort is normal.     Breath sounds: Normal breath sounds.  Abdominal:     General: Bowel sounds are normal. There is no distension.     Palpations: Abdomen is soft.     Tenderness: There is no abdominal tenderness. There is no guarding.  Genitourinary:    Comments: Declines GU exam Musculoskeletal: Normal range of motion.  Skin:    General: Skin is warm and dry.  Neurological:     Mental Status: She is alert and oriented to person, place, and time.  Psychiatric:        Mood and Affect: Mood normal.        Thought Content: Thought content normal.        Judgment: Judgment normal.     Results for orders placed or performed in visit on 12/11/18  Pregnancy, urine  Result Value Ref Range   Preg Test, Ur Negative Negative      Assessment & Plan:   Problem List Items Addressed This Visit      Other   Bipolar 2 disorder (HCC)    Currently depressed. Did not tolerate abilify in the past, does not want to try anything similar to that. Referral placed to Psychiatry for further management and counseling recommendations. F/u in 1 month if  not established there at that point.        Other Visit Diagnoses    Missed period    -  Primary   Urine preg negative, suspect stress related. Continue to monitor closely, may recheck next week for reassurance if still no menstrual cycle   Relevant Orders   Pregnancy, urine (Completed)   Pregnancy, urine   Depression, recurrent (HCC)       Referral placed to Psychiatry. Start zoloft in meantime, watch closely for manic sxs. Intolerant to abilify and uncomfortable trying similar medications   Relevant Medications   sertraline (ZOLOFT) 25 MG tablet   Other Relevant Orders   Ambulatory referral to Psychiatry     25 minutes spent today in direct care and counseling with patient  Follow up plan: Return in about 4 weeks (around 01/08/2019) for Anxiety, mood f/u.

## 2018-12-17 NOTE — Assessment & Plan Note (Signed)
Currently depressed. Did not tolerate abilify in the past, does not want to try anything similar to that. Referral placed to Psychiatry for further management and counseling recommendations. F/u in 1 month if not established there at that point.

## 2019-01-08 ENCOUNTER — Ambulatory Visit: Payer: Medicaid Other | Admitting: Family Medicine

## 2019-01-22 ENCOUNTER — Encounter: Payer: Self-pay | Admitting: Family Medicine

## 2019-01-22 ENCOUNTER — Other Ambulatory Visit: Payer: Self-pay | Admitting: Family Medicine

## 2019-01-22 MED ORDER — SERTRALINE HCL 25 MG PO TABS: 25.0000 mg | ORAL_TABLET | Freq: Every day | ORAL | 0 refills | Status: DC

## 2019-01-22 NOTE — Telephone Encounter (Signed)
Routing to provider  

## 2019-01-24 ENCOUNTER — Other Ambulatory Visit: Payer: Medicaid Other

## 2019-01-24 ENCOUNTER — Other Ambulatory Visit: Payer: Self-pay

## 2019-01-24 ENCOUNTER — Encounter: Payer: Self-pay | Admitting: Family Medicine

## 2019-01-24 ENCOUNTER — Ambulatory Visit (INDEPENDENT_AMBULATORY_CARE_PROVIDER_SITE_OTHER): Payer: Medicaid Other | Admitting: Family Medicine

## 2019-01-24 ENCOUNTER — Other Ambulatory Visit: Payer: Self-pay | Admitting: Family Medicine

## 2019-01-24 VITALS — Ht 61.0 in | Wt 165.0 lb

## 2019-01-24 DIAGNOSIS — N898 Other specified noninflammatory disorders of vagina: Secondary | ICD-10-CM

## 2019-01-24 DIAGNOSIS — R350 Frequency of micturition: Secondary | ICD-10-CM | POA: Diagnosis not present

## 2019-01-24 DIAGNOSIS — F3181 Bipolar II disorder: Secondary | ICD-10-CM | POA: Diagnosis not present

## 2019-01-24 DIAGNOSIS — N926 Irregular menstruation, unspecified: Secondary | ICD-10-CM

## 2019-01-24 LAB — UA/M W/RFLX CULTURE, ROUTINE
Bilirubin, UA: NEGATIVE
Glucose, UA: NEGATIVE
Ketones, UA: NEGATIVE
Leukocytes,UA: NEGATIVE
Nitrite, UA: NEGATIVE
Protein,UA: NEGATIVE
RBC, UA: NEGATIVE
Specific Gravity, UA: 1.02 (ref 1.005–1.030)
Urobilinogen, Ur: 4 mg/dL — ABNORMAL HIGH (ref 0.2–1.0)
pH, UA: 7.5 (ref 5.0–7.5)

## 2019-01-24 LAB — WET PREP FOR TRICH, YEAST, CLUE
Clue Cell Exam: POSITIVE — AB
Trichomonas Exam: NEGATIVE
Yeast Exam: NEGATIVE

## 2019-01-24 LAB — PREGNANCY, URINE: Preg Test, Ur: NEGATIVE

## 2019-01-24 MED ORDER — SERTRALINE HCL 50 MG PO TABS: 50.0000 mg | ORAL_TABLET | Freq: Every day | ORAL | 3 refills | Status: DC

## 2019-01-24 MED ORDER — METRONIDAZOLE 500 MG PO TABS: 500.0000 mg | ORAL_TABLET | Freq: Two times a day (BID) | ORAL | 0 refills | Status: DC

## 2019-01-24 NOTE — Progress Notes (Signed)
Ht 5\' 1"  (1.549 m)   Wt 165 lb (74.8 kg)   BMI 31.18 kg/m    Subjective:    Patient ID: , female    DOB: 1990-03-23, 28 y.o.   MRN: [id number]  HPI: Carrie [name] is a 28 y.o. female  Chief Complaint  Patient presents with  . Vaginal Discharge  . Urinary Frequency  . Anxiety    patient would like to discuss about sertraline  . Depression    . This visit was completed via telephone due to the restrictions of the COVID-19 pandemic. All issues as above were discussed and addressed. Physical exam was done as above through visual confirmation on telephone. If it was felt that the patient should be evaluated in the office, they were directed there. The patient verbally consented to this visit. . Location of the patient: home . Location of the provider: work . Those involved with this call:  . Provider: 26, PA-C . CMA: Roosvelt Maser, CMA . Front Desk/Registration: Elton Sin  . Time spent on call: 25 minutes on the phone discussing health concerns. 5 minutes total spent in review of patient's record and preparation of their chart. I verified patient identity using two factors (patient name and date of birth). Patient consents verbally to being seen via telemedicine visit today.   Patient here today for mood follow up bipolar depression. Ran out of the zoloft 1-2 weeks ago and has definitely noticed a difference since being off. Feels on edge, agitated, depressed, moody. Would like to restart. Awaiting establishing with Psychiatry. Nervous to try new medications at this time as she did not tolerate mood stabilizers or stimulants well in the past. Denies sI/HI.   Vaginal discharge and occasional odor for about 1 month now. Denies urinary sxs other than frequency occasionally, fevers, abdominal pain, fevers, chills, concern for STIs or pregnancy. Not trying anything OTC for sxs.   Depression screen [facility] 2/9 01/24/2019 04/16/2017 03/21/2016  Decreased Interest 0 0 1   Down, Depressed, Hopeless 3 1 3   PHQ - 2 Score 3 1 4   Altered sleeping 2 0 1  Tired, decreased energy 0 2 1  Change in appetite 3 2 0  Feeling bad or failure about yourself  2 0 0  Trouble concentrating 0 0 0  Moving slowly or fidgety/restless 0 0 0  Suicidal thoughts 0 0 0  PHQ-9 Score 10 5 6   Difficult doing work/chores Not difficult at all Somewhat difficult Not difficult at all    Relevant past medical, surgical, family and social history reviewed and updated as indicated. Interim medical history since our last visit reviewed. Allergies and medications reviewed and updated.  Review of Systems  Per HPI unless specifically indicated above     Objective:    Ht 5\' 1"  (1.549 m)   Wt 165 lb (74.8 kg)   BMI 31.18 kg/m   Wt Readings from Last 3 Encounters:  01/24/19 165 lb (74.8 kg)  12/11/18 167 lb 6.4 oz (75.9 kg)  09/26/17 175 lb 6.4 oz (79.6 kg)    Physical Exam  Unable to perform PE due to patient lack of access to video technology for today's visit  Results for orders placed or performed in visit on 01/24/19  UA/M w/rflx Culture, Routine   Specimen: Urine   URINE  Result Value Ref Range   Specific Gravity, UA 1.020 1.005 - 1.030   pH, UA 7.5 5.0 - 7.5   Color, UA Yellow Yellow  Appearance Ur Clear Clear   Leukocytes,UA Negative Negative   Protein,UA Negative Negative/Trace   Glucose, UA Negative Negative   Ketones, UA Negative Negative   RBC, UA Negative Negative   Bilirubin, UA Negative Negative   Urobilinogen, Ur 4.0 (H) 0.2 - 1.0 mg/dL   Nitrite, UA Negative Negative      Assessment & Plan:   Problem List Items Addressed This Visit      Other   Bipolar 2 disorder ([place])    Restart zoloft, pt wishes to establish with Psychiatry prior to adding any new medications as she's had tolerance issues in the past with several medications. F/u in 1 month if not in with Psychiatry yet       Other Visit Diagnoses    Vaginal discharge    -  Primary   Wet  prep + for BV. Tx with flagyl, probiotics, good vaginal hygiene. F/u if not resolving   Urinary frequency       U/A benign. Push fluids, continue to monitor   Relevant Orders   UA/M w/rflx Culture, Routine (Completed)       Follow up plan: Return in about 4 weeks (around 02/21/2019) for Mood f/u.

## 2019-01-27 ENCOUNTER — Ambulatory Visit: Payer: Medicaid Other | Admitting: Family Medicine

## 2019-01-27 NOTE — Assessment & Plan Note (Signed)
Restart zoloft, pt wishes to establish with Psychiatry prior to adding any new medications as she's had tolerance issues in the past with several medications. F/u in 1 month if not in with Psychiatry yet

## 2019-02-19 ENCOUNTER — Encounter: Payer: Self-pay | Admitting: Family Medicine

## 2019-02-20 ENCOUNTER — Other Ambulatory Visit: Payer: Self-pay | Admitting: Family Medicine

## 2019-02-20 MED ORDER — FLUCONAZOLE 150 MG PO TABS: 150.0000 mg | ORAL_TABLET | Freq: Once | ORAL | 0 refills | Status: AC

## 2019-05-14 ENCOUNTER — Ambulatory Visit: Payer: Self-pay | Admitting: *Deleted

## 2019-05-14 NOTE — Telephone Encounter (Signed)
Patient calling with an ED diagnosis of non-intractable vomiting after 2 ED visits.Vomits twice daily most days. Feels nauseated all the time. Has been occurring for one month, only able to keep down popsicles and a few bites of bland food at times. Gets dizzy and weak with activity. Denies fever/CP. No abdominal pain/tenderness/bloating/diarrhea. Voids 2x daily with no odor/blood noted. Reports no good BM during this time. Does admit to recent marijuana use on 04/28/19.Reports a GI cocktail helped once.Encouraged sips hourly then bland food bites when able to tolerate. Has appointment with [facility] on 05/21/19 but requesting earlier appointment.   Routing to office for review and earlier appointment if possible. None available to [facility] RN. Encouraged to seek treatment if she feels worse.   Reason for Disposition . Vomiting is a chronic symptom (recurrent or ongoing AND present > 4 weeks)  Answer Assessment - Initial Assessment Questions 1. NAUSEA SEVERITY: "How bad is the nausea?" (e.g., mild, moderate, severe; dehydration, weight loss)   - MILD: loss of appetite without change in eating habits   - MODERATE: decreased oral intake without significant weight loss, dehydration, or malnutrition   - SEVERE: inadequate caloric or fluid intake, significant weight loss, symptoms of dehydration    Moderate to severe. 2. ONSET: "When did the nausea begin?"     One month ago 3. VOMITING: "Any vomiting?" If so, ask: "How many times today?"     Yes, daily. Last at midnight. 4. RECURRENT SYMPTOM: "Have you had nausea before?" If so, ask: "When was the last time?" "What happened that time?"     No. 5. CAUSE: "What do you think is causing the nausea?"     unsure 6. PREGNANCY: "Is there any chance you are pregnant?" (e.g., unprotected intercourse, missed birth control pill, broken condom)     No.  Protocols used: VOMITING-A-AH, NAUSEA-A-AH

## 2019-05-14 NOTE — Telephone Encounter (Signed)
Called pt to see about scheduling virtual ed f/u being that she went to [facility], no answer, left vm.

## 2019-05-15 NOTE — Telephone Encounter (Signed)
Thank you, will await callback

## 2019-05-21 ENCOUNTER — Ambulatory Visit (INDEPENDENT_AMBULATORY_CARE_PROVIDER_SITE_OTHER): Payer: 59 | Admitting: Family Medicine

## 2019-05-21 ENCOUNTER — Other Ambulatory Visit: Payer: Self-pay

## 2019-05-21 ENCOUNTER — Encounter: Payer: Self-pay | Admitting: Family Medicine

## 2019-05-21 VITALS — BP 114/77 | HR 91 | Temp 98.3°F | Wt 157.0 lb

## 2019-05-21 DIAGNOSIS — J4540 Moderate persistent asthma, uncomplicated: Secondary | ICD-10-CM | POA: Diagnosis not present

## 2019-05-21 DIAGNOSIS — R112 Nausea with vomiting, unspecified: Secondary | ICD-10-CM | POA: Diagnosis not present

## 2019-05-21 DIAGNOSIS — F419 Anxiety disorder, unspecified: Secondary | ICD-10-CM

## 2019-05-21 DIAGNOSIS — O99519 Diseases of the respiratory system complicating pregnancy, unspecified trimester: Secondary | ICD-10-CM | POA: Diagnosis not present

## 2019-05-21 DIAGNOSIS — J45909 Unspecified asthma, uncomplicated: Secondary | ICD-10-CM

## 2019-05-21 MED ORDER — MONTELUKAST SODIUM 10 MG PO TABS: 10.0000 mg | ORAL_TABLET | Freq: Every day | ORAL | 3 refills | Status: DC

## 2019-05-21 MED ORDER — ALBUTEROL SULFATE HFA 108 (90 BASE) MCG/ACT IN AERS: 1.0000 | INHALATION_SPRAY | Freq: Four times a day (QID) | RESPIRATORY_TRACT | 5 refills | Status: DC | PRN

## 2019-05-21 MED ORDER — BUDESONIDE-FORMOTEROL FUMARATE 160-4.5 MCG/ACT IN AERO: 2.0000 | INHALATION_SPRAY | Freq: Two times a day (BID) | RESPIRATORY_TRACT | 2 refills | Status: DC

## 2019-05-21 MED ORDER — SERTRALINE HCL 50 MG PO TABS: ORAL_TABLET | ORAL | 0 refills | Status: DC

## 2019-05-21 NOTE — Progress Notes (Signed)
BP 114/77   Pulse 91   Temp 98.3 F (36.8 C) (Oral)   Wt 157 lb (71.2 kg)   SpO2 99%   BMI 29.66 kg/m    Subjective:    Patient ID: Carrie [name], female    DOB: Mar 14, 1990, 29 y.o.   MRN: [id number]  HPI: Carrie [name] is a 29 y.o. female  Chief Complaint  Patient presents with  . Emesis    x about a month. pt states that she started feeling better this last week  . Anxiety  . Medication Refill    albuterol inhaler   Presenting today for f/u numerous ER visits for intractable N/V. Was diagnosed with cannabinoid hyperemesis and has since d/c'd marijuana products. Sxs resolved last week. Lost significant weight and required IV fluids on several occasions over past 2 months. Not currently needing any anti emetics, tolerating PO well and without N/V sxs. States she will avoid these products going forward.   Dealing with significant anxiety and panic attacks, worse recently. Tried her mom's xanax but it made her sleepy. Was on zoloft in the past which did seem to help but she didn't stay on it previously because she had felt better. Denies SI/HI.   Asthma - having to use the albuterol every other day. Needing refill. Allergies are acting up quite a bit as well.   Depression screen [facility] 2/9 05/21/2019 01/24/2019 04/16/2017  Decreased Interest 0 0 0  Down, Depressed, Hopeless 2 3 1   PHQ - 2 Score 2 3 1   Altered sleeping 3 2 0  Tired, decreased energy 3 0 2  Change in appetite 3 3 2   Feeling bad or failure about yourself  3 2 0  Trouble concentrating 3 0 0  Moving slowly or fidgety/restless 3 0 0  Suicidal thoughts 0 0 0  PHQ-9 Score 20 10 5   Difficult doing work/chores - Not difficult at all Somewhat difficult   GAD 7 : Generalized Anxiety Score 05/21/2019 01/24/2019  Nervous, Anxious, on Edge 3 3  Control/stop worrying 3 3  Worry too much - different things 3 3  Trouble relaxing 3 3  Restless 3 0  Easily annoyed or irritable 3 3  Afraid - awful might happen 3 3  Total  GAD 7 Score 21 18  Anxiety Difficulty - Somewhat difficult     Relevant past medical, surgical, family and social history reviewed and updated as indicated. Interim medical history since our last visit reviewed. Allergies and medications reviewed and updated.  Review of Systems  Per HPI unless specifically indicated above     Objective:    BP 114/77   Pulse 91   Temp 98.3 F (36.8 C) (Oral)   Wt 157 lb (71.2 kg)   SpO2 99%   BMI 29.66 kg/m   Wt Readings from Last 3 Encounters:  05/21/19 157 lb (71.2 kg)  01/24/19 165 lb (74.8 kg)  12/11/18 167 lb 6.4 oz (75.9 kg)    Physical Exam  Results for orders placed or performed in visit on 01/24/19  UA/M w/rflx Culture, Routine   Specimen: Urine   URINE  Result Value Ref Range   Specific Gravity, UA 1.020 1.005 - 1.030   pH, UA 7.5 5.0 - 7.5   Color, UA Yellow Yellow   Appearance Ur Clear Clear   Leukocytes,UA Negative Negative   Protein,UA Negative Negative/Trace   Glucose, UA Negative Negative   Ketones, UA Negative Negative   RBC, UA Negative Negative   Bilirubin,  UA Negative Negative   Urobilinogen, Ur 4.0 (H) 0.2 - 1.0 mg/dL   Nitrite, UA Negative Negative      Assessment & Plan:   Problem List Items Addressed This Visit      Respiratory   Asthma    Add symbicort daily and singulair, continue prn albuterol      Relevant Medications   albuterol (VENTOLIN HFA) 108 (90 Base) MCG/ACT inhaler   budesonide-formoterol (SYMBICORT) 160-4.5 MCG/ACT inhaler   montelukast (SINGULAIR) 10 MG tablet     Other   Anxiety    Exacerbated, restart zoloft as this helped previously. Monitor closely for benefit, recheck 1 month. Recommended counseling as well - resources given      Relevant Medications   sertraline (ZOLOFT) 50 MG tablet    Other Visit Diagnoses    Non-intractable vomiting with nausea, unspecified vomiting type    -  Primary   Resolved with d/c of marijuana products. Continue to monitor and avoid these.  Has zofran for prn use if needed   Asthma affecting pregnancy, antepartum       Relevant Medications   albuterol (VENTOLIN HFA) 108 (90 Base) MCG/ACT inhaler   budesonide-formoterol (SYMBICORT) 160-4.5 MCG/ACT inhaler   montelukast (SINGULAIR) 10 MG tablet       Follow up plan: Return in about 4 weeks (around 06/18/2019) for Anxiety and asthma f/u.

## 2019-05-29 DIAGNOSIS — F419 Anxiety disorder, unspecified: Secondary | ICD-10-CM | POA: Insufficient documentation

## 2019-05-29 NOTE — Assessment & Plan Note (Signed)
Exacerbated, restart zoloft as this helped previously. Monitor closely for benefit, recheck 1 month. Recommended counseling as well - resources given

## 2019-05-29 NOTE — Assessment & Plan Note (Signed)
Add symbicort daily and singulair, continue prn albuterol

## 2019-06-18 ENCOUNTER — Ambulatory Visit: Payer: 59 | Admitting: Family Medicine

## 2019-06-26 ENCOUNTER — Ambulatory Visit: Payer: 59 | Admitting: Family Medicine

## 2020-01-13 ENCOUNTER — Telehealth: Payer: Self-pay | Admitting: Family Medicine

## 2020-01-13 NOTE — Telephone Encounter (Signed)
Can we see if she can come in for wet prep prior to visit to ensure we treat for correct issue. Thank you.

## 2020-01-13 NOTE — Telephone Encounter (Signed)
Not current medication list Review for fill

## 2020-01-13 NOTE — Telephone Encounter (Signed)
Medication Refill - Medication: diflucan  Has the patient contacted their pharmacy? Yes.   (Agent: If no, request that the patient contact the pharmacy for the refill.) (Agent: If yes, when and what did the pharmacy advise?)  Preferred Pharmacy (with phone number or street name):  [id number] - [place], [place] - [address] AT [facility] [address]  [address] [place] [address]  Phone: [phone] Fax: [phone]  Hours: Not open 24 hours     Agent: Please be advised that RX refills may take up to 3 business days. We ask that you follow-up with your pharmacy.

## 2020-01-13 NOTE — Telephone Encounter (Signed)
Patient called and said that she has a yeast infection and that she is allergic to the creams, and that she needs diflucan, first available is Thursday with Dr.Rumball.

## 2020-01-14 NOTE — Telephone Encounter (Signed)
Yes, we can do at that time and then send script as needed.

## 2020-01-14 NOTE — Telephone Encounter (Signed)
Called patient, no answer, unable to leave a message, will try again.   

## 2020-01-14 NOTE — Progress Notes (Deleted)
    SUBJECTIVE:   CHIEF COMPLAINT / HPI:   Past Medical History:  Diagnosis Date  . Asthma   . Bipolar 2 disorder (HCC)   . Psychiatric disorder   . Vitamin D deficiency disease    VAGINAL DISCHARGE Duration: {Blank single:19197::"days","weeks","months"} Discharge description: {Blank single:19197::"white","cottage cheese","clear","yellow","mucous"}  Pruritus: {Blank single:19197::"yes","no"} Dysuria: {Blank single:19197::"yes","no"} Malodorous: {Blank single:19197::"yes","no","worse after sex"} Urinary frequency: {Blank single:19197::"yes","no"} Fevers: {Blank single:19197::"yes","no"} Abdominal pain: {Blank single:19197::"yes","no"}  Sexual activity: {Blank single:19197::"not sexually active","monogamous","practicing safe sex","concerned about STIs"} History of sexually transmitted diseases: {Blank single:19197::"yes","no"} Recent antibiotic use: {Blank single:19197::"yes","no"} Context: {Blank multiple:19196::"better","worse", "previous yeast infections","recurrent yeast infections","recurrent BV"}  Treatments attempted: {Blank multiple:19196::"none","antifungal","vagisil"}  ***due for pap  OBJECTIVE:   There were no vitals taken for this visit.  ***  ASSESSMENT/PLAN:   No problem-specific Assessment & Plan notes found for this encounter.     Caro Laroche, DO [facility]

## 2020-01-14 NOTE — Telephone Encounter (Signed)
She has an in office appt, can we just do it at the appt

## 2020-01-15 ENCOUNTER — Ambulatory Visit: Payer: Self-pay | Admitting: Family Medicine

## 2020-01-15 NOTE — Telephone Encounter (Signed)
Will close this encounter, patient is scheduled to see [place] today.

## 2020-03-08 ENCOUNTER — Ambulatory Visit (INDEPENDENT_AMBULATORY_CARE_PROVIDER_SITE_OTHER): Payer: Self-pay | Admitting: Nurse Practitioner

## 2020-03-08 ENCOUNTER — Other Ambulatory Visit: Payer: Self-pay

## 2020-03-08 ENCOUNTER — Encounter: Payer: Self-pay | Admitting: Nurse Practitioner

## 2020-03-08 VITALS — BP 115/80 | HR 90 | Temp 98.3°F | Wt 161.8 lb

## 2020-03-08 DIAGNOSIS — R197 Diarrhea, unspecified: Secondary | ICD-10-CM

## 2020-03-08 DIAGNOSIS — N926 Irregular menstruation, unspecified: Secondary | ICD-10-CM

## 2020-03-08 DIAGNOSIS — R1030 Lower abdominal pain, unspecified: Secondary | ICD-10-CM

## 2020-03-08 LAB — PREGNANCY, URINE: Preg Test, Ur: NEGATIVE

## 2020-03-08 NOTE — Progress Notes (Addendum)
BP 115/80    Pulse 90    Temp 98.3 F (36.8 C) (Oral)    Wt 161 lb 12.8 oz (73.4 kg)    LMP 01/21/2020 (Exact Date)    BMI 30.57 kg/m    Subjective:    Patient ID: Carrie [name], female    DOB: Jun 29, 1990, 30 y.o.   MRN: [id number]  HPI: Carrie [name] is a 30 y.o. female  Chief Complaint  Patient presents with   Abdominal Pain    Pt states she has been having bilateral lower abdominal pain for the last few weeks, currently rates pain at a 5. Has been up to a 10   Amenorrhea    Pt states her last cycle started on 01/21/20. States according to her app, she ovulated twice last month.    Patient presents to clinic today with complaints of her period being 20 days late.  Patient also states that she is having abdominal pain and pressure.   ABDOMINAL ISSUES Duration: started last night Nature: Patient states it feels like hear uterus is heavy and like it will fall out, sharp and aching Location: LLQ  Severity: 10/10  Radiation: no Episode duration: Frequency: constant Alleviating factors: Aggravating factors: sexual intercourse Treatments attempted: Patient hasn't tried any interventions. Constipation: no Diarrhea: yes Episodes of diarrhea/day: Once Mucous in the stool: no Heartburn: no Bloating:yes Flatulence: no Nausea: yes Vomiting: no Episodes of vomit/day:0 Melena or hematochezia: no Rash: no Jaundice: no Fever: no Weight loss: no   Relevant past medical, surgical, family and social history reviewed and updated as indicated. Interim medical history since our last visit reviewed. Allergies and medications reviewed and updated.  Review of Systems  Per HPI unless specifically indicated above     Objective:    BP 115/80    Pulse 90    Temp 98.3 F (36.8 C) (Oral)    Wt 161 lb 12.8 oz (73.4 kg)    LMP 01/21/2020 (Exact Date)    BMI 30.57 kg/m   Wt Readings from Last 3 Encounters:  03/08/20 161 lb 12.8 oz (73.4 kg)  05/21/19 157 lb (71.2 kg)  01/24/19  165 lb (74.8 kg)    Physical Exam Vitals and nursing note reviewed.  Constitutional:      General: She is awake. She is not in acute distress.    Appearance: She is well-developed. She is not ill-appearing.  HENT:     Head: Normocephalic and atraumatic.     Right Ear: Hearing, tympanic membrane, ear canal and external ear normal. No drainage.     Left Ear: Hearing, tympanic membrane, ear canal and external ear normal. No drainage.     Nose: Nose normal.     Right Sinus: No maxillary sinus tenderness or frontal sinus tenderness.     Left Sinus: No maxillary sinus tenderness or frontal sinus tenderness.     Mouth/Throat:     Mouth: Mucous membranes are moist.     Pharynx: Oropharynx is clear. Uvula midline. No pharyngeal swelling, oropharyngeal exudate or posterior oropharyngeal erythema.  Eyes:     General: Lids are normal.        Right eye: No discharge.        Left eye: No discharge.     Extraocular Movements: Extraocular movements intact.     Conjunctiva/sclera: Conjunctivae normal.     Pupils: Pupils are equal, round, and reactive to light.     Visual Fields: Right eye visual fields normal and left eye visual fields normal.  Neck:     Thyroid: No thyromegaly.     Vascular: No carotid bruit.     Trachea: Trachea normal.  Cardiovascular:     Rate and Rhythm: Normal rate and regular rhythm.     Heart sounds: Normal heart sounds. No murmur heard. No gallop.   Pulmonary:     Effort: Pulmonary effort is normal. No accessory muscle usage or respiratory distress.     Breath sounds: Normal breath sounds.  Chest:  Breasts:     Right: Normal. No axillary adenopathy or supraclavicular adenopathy.     Left: Normal. No axillary adenopathy or supraclavicular adenopathy.    Abdominal:     General: Bowel sounds are normal.     Palpations: Abdomen is soft. There is no hepatomegaly or splenomegaly.     Tenderness: There is abdominal tenderness in the right lower quadrant and left lower  quadrant. There is guarding. Negative signs include McBurney's sign.  Musculoskeletal:        General: Normal range of motion.     Cervical back: Normal range of motion and neck supple.     Right lower leg: No edema.     Left lower leg: No edema.  Lymphadenopathy:     Head:     Right side of head: No submental, submandibular, tonsillar, preauricular or posterior auricular adenopathy.     Left side of head: No submental, submandibular, tonsillar, preauricular or posterior auricular adenopathy.     Cervical: No cervical adenopathy.     Upper Body:     Right upper body: No supraclavicular, axillary or pectoral adenopathy.     Left upper body: No supraclavicular, axillary or pectoral adenopathy.  Skin:    General: Skin is warm and dry.     Capillary Refill: Capillary refill takes less than 2 seconds.     Findings: No rash.  Neurological:     Mental Status: She is alert and oriented to person, place, and time.     Cranial Nerves: Cranial nerves are intact.     Gait: Gait is intact.     Deep Tendon Reflexes: Reflexes are normal and symmetric.     Reflex Scores:      Brachioradialis reflexes are 2+ on the right side and 2+ on the left side.      Patellar reflexes are 2+ on the right side and 2+ on the left side. Psychiatric:        Attention and Perception: Attention normal.        Mood and Affect: Mood normal.        Speech: Speech normal.        Behavior: Behavior normal. Behavior is cooperative.        Thought Content: Thought content normal.        Judgment: Judgment normal.     Results for orders placed or performed in visit on 03/08/20  Pregnancy, urine  Result Value Ref Range   Preg Test, Ur Negative Negative  Comp Met (CMET)  Result Value Ref Range   Glucose 96 65 - 99 mg/dL   BUN 12 6 - 20 mg/dL   Creatinine, Ser 0.67 0.57 - 1.00 mg/dL   GFR calc non Af Amer 119 >59 mL/min/1.73   GFR calc Af Amer 137 >59 mL/min/1.73   BUN/Creatinine Ratio 18 9 - 23   Sodium 138 134 -  144 mmol/L   Potassium 4.4 3.5 - 5.2 mmol/L   Chloride 103 96 - 106 mmol/L   CO2 20 20 -  29 mmol/L   Calcium 9.5 8.7 - 10.2 mg/dL   Total Protein 8.1 6.0 - 8.5 g/dL   Albumin 4.6 3.9 - 5.0 g/dL   Globulin, Total 3.5 1.5 - 4.5 g/dL   Albumin/Globulin Ratio 1.3 1.2 - 2.2   Bilirubin Total 0.5 0.0 - 1.2 mg/dL   Alkaline Phosphatase 94 44 - 121 IU/L   AST 12 0 - 40 IU/L   ALT 9 0 - 32 IU/L  CBC with Differential  Result Value Ref Range   WBC 6.5 3.4 - 10.8 x10E3/uL   RBC 4.58 3.77 - 5.28 x10E6/uL   Hemoglobin 11.3 11.1 - 15.9 g/dL   Hematocrit 36.0 34.0 - 46.6 %   MCV 79 79 - 97 fL   MCH 24.7 (L) 26.6 - 33.0 pg   MCHC 31.4 (L) 31.5 - 35.7 g/dL   RDW 15.8 (H) 11.7 - 15.4 %   Platelets 388 150 - 450 x10E3/uL   Neutrophils 54 Not Estab. %   Lymphs 36 Not Estab. %   Monocytes 9 Not Estab. %   Eos 0 Not Estab. %   Basos 1 Not Estab. %   Neutrophils Absolute 3.5 1.4 - 7.0 x10E3/uL   Lymphocytes Absolute 2.3 0.7 - 3.1 x10E3/uL   Monocytes Absolute 0.6 0.1 - 0.9 x10E3/uL   EOS (ABSOLUTE) 0.0 0.0 - 0.4 x10E3/uL   Basophils Absolute 0.0 0.0 - 0.2 x10E3/uL   Immature Granulocytes 0 Not Estab. %   Immature Grans (Abs) 0.0 0.0 - 0.1 x10E3/uL      Assessment & Plan:   Problem List Items Addressed This Visit   None   Visit Diagnoses    Lower abdominal pain    -  Primary   STAT CT abdomen and lab work ordered due to patient's symptoms.  Will make further recommendations based on lab results.   Relevant Orders   CT Abdomen Pelvis W Contrast   Missed period       Pregnancy test negative. Lab work and CT ordered during visit today.    Relevant Orders   Pregnancy, urine (Completed)   Diarrhea, unspecified type       Relevant Orders   Comp Met (CMET) (Completed)   CBC with Differential (Completed)   CT Abdomen Pelvis W Contrast       Follow up plan: Return for Based on imaging and lab results.[name]

## 2020-03-09 LAB — COMPREHENSIVE METABOLIC PANEL
ALT: 9 IU/L (ref 0–32)
AST: 12 IU/L (ref 0–40)
Albumin/Globulin Ratio: 1.3 (ref 1.2–2.2)
Albumin: 4.6 g/dL (ref 3.9–5.0)
Alkaline Phosphatase: 94 IU/L (ref 44–121)
BUN/Creatinine Ratio: 18 (ref 9–23)
BUN: 12 mg/dL (ref 6–20)
Bilirubin Total: 0.5 mg/dL (ref 0.0–1.2)
CO2: 20 mmol/L (ref 20–29)
Calcium: 9.5 mg/dL (ref 8.7–10.2)
Chloride: 103 mmol/L (ref 96–106)
Creatinine, Ser: 0.67 mg/dL (ref 0.57–1.00)
GFR calc Af Amer: 137 mL/min/{1.73_m2} (ref >59)
GFR calc non Af Amer: 119 mL/min/{1.73_m2} (ref >59)
Globulin, Total: 3.5 g/dL (ref 1.5–4.5)
Glucose: 96 mg/dL (ref 65–99)
Potassium: 4.4 mmol/L (ref 3.5–5.2)
Sodium: 138 mmol/L (ref 134–144)
Total Protein: 8.1 g/dL (ref 6.0–8.5)

## 2020-03-09 LAB — CBC WITH DIFFERENTIAL/PLATELET
Basophils Absolute: 0 10*3/uL (ref 0.0–0.2)
Basos: 1 %
EOS (ABSOLUTE): 0 10*3/uL (ref 0.0–0.4)
Eos: 0 %
Hematocrit: 36 % (ref 34.0–46.6)
Hemoglobin: 11.3 g/dL (ref 11.1–15.9)
Immature Grans (Abs): 0 10*3/uL (ref 0.0–0.1)
Immature Granulocytes: 0 %
Lymphocytes Absolute: 2.3 10*3/uL (ref 0.7–3.1)
Lymphs: 36 %
MCH: 24.7 pg — ABNORMAL LOW (ref 26.6–33.0)
MCHC: 31.4 g/dL — ABNORMAL LOW (ref 31.5–35.7)
MCV: 79 fL (ref 79–97)
Monocytes Absolute: 0.6 10*3/uL (ref 0.1–0.9)
Monocytes: 9 %
Neutrophils Absolute: 3.5 10*3/uL (ref 1.4–7.0)
Neutrophils: 54 %
Platelets: 388 10*3/uL (ref 150–450)
RBC: 4.58 x10E6/uL (ref 3.77–5.28)
RDW: 15.8 % — ABNORMAL HIGH (ref 11.7–15.4)
WBC: 6.5 10*3/uL (ref 3.4–10.8)

## 2021-02-06 NOTE — L&D Delivery Note (Signed)
[facility]N [facility] Delivery Note  English Tomer is a 31 y.o. [id number] s/p SVD at [redacted]w[redacted]d. She was admitted for spontaneous onset of labor.   ROM: 7h 53m with clear fluid GBS Status: Negative Maximum Maternal Temperature: 98.2 F  Labor Progress: Patient admitted in active labor, with AROM performed and pitocin started when contraction frequency decreased, and patient progressed to complete.  Delivery Date/Time: 10/24/21 at 1054  Delivery: Called to room and patient was complete and pushing. Head delivered LOA. No nuchal cord present. Shoulder and body delivered in usual fashion. Infant with spontaneous cry, placed on mother's abdomen, dried and stimulated. Cord clamped x 2 after 1-minute delay, and cut by father of baby under my direct supervision. Cord blood drawn. Placenta delivered spontaneously with gentle cord traction. Fundus firm with massage and Pitocin. Labia, perineum, vagina, and cervix were inspected, small perineal abrasion noted that was hemostatic and not needing repair.   Placenta: complete, three vessel cord appreciated Complications: None Lacerations: none, small perineal abrasion EBL: 250 mL Analgesia: PRN IV fentanyl  Postpartum Planning [x]  message to sent to schedule follow-up  [x]  vaccines UTD  Infant: viable female infant  APGARs 9,9   weight pending  Yehuda Savannah, [place] for [employer], [place]

## 2021-03-18 ENCOUNTER — Ambulatory Visit (INDEPENDENT_AMBULATORY_CARE_PROVIDER_SITE_OTHER): Payer: 59

## 2021-03-18 ENCOUNTER — Encounter: Payer: Self-pay | Admitting: Family Medicine

## 2021-03-18 ENCOUNTER — Other Ambulatory Visit: Payer: Self-pay

## 2021-03-18 VITALS — BP 117/76 | HR 76 | Ht 61.5 in | Wt 179.2 lb

## 2021-03-18 DIAGNOSIS — Z3201 Encounter for pregnancy test, result positive: Secondary | ICD-10-CM | POA: Diagnosis not present

## 2021-03-18 DIAGNOSIS — O2340 Unspecified infection of urinary tract in pregnancy, unspecified trimester: Secondary | ICD-10-CM

## 2021-03-18 DIAGNOSIS — N898 Other specified noninflammatory disorders of vagina: Secondary | ICD-10-CM

## 2021-03-18 LAB — POCT PREGNANCY, URINE: Preg Test, Ur: POSITIVE — AB

## 2021-03-18 MED ORDER — TERCONAZOLE 0.4 % VA CREA: 1.0000 | TOPICAL_CREAM | Freq: Every day | VAGINAL | Status: AC

## 2021-03-18 MED ORDER — PRENATAL VITAMINS 28-0.8 MG PO TABS: 1.0000 | ORAL_TABLET | Freq: Every day | ORAL | 11 refills | Status: DC

## 2021-03-18 NOTE — Patient Instructions (Signed)
Prenatal Care Providers           [facility]  [address] [phone]  [facility]   [address]  [phone]  [facility] @ [place] [facility]       [address] [phone]            [facility] @ [place]     [id number] [phone]          [facility] @ [place]   [address] [phone]  [facility]  [address] [phone]     [facility]  [address]   [phone]     [facility]  Phone: [phone]  [facility]  Phone: [phone]  [facility] Phone: [phone]  [facility] Phone: [phone]  [facility] Phone: [phone]  [facility] Phone: [phone]  [facility]  Phone: [phone]  

## 2021-03-18 NOTE — Progress Notes (Signed)
Here for pregnancy test which was positive. Reports LMP 02/04/21, states sure of date and was normal period. This makes her 6 wk0d with EDD 11/11/21. States she has had 2 children, last with [place] had polyhydraminous.  She would like to get prenatal care with [place] . Given choices of prenatal care. Chooses traditional prenatal care. PNV RX sent in per request. C/o yeast infection with vaginal itching. Given RX for terconazole per Camelia Eng. Melburn Hake, CNM in to talk with patient.  Suheyb Raucci,RN

## 2021-03-18 NOTE — Progress Notes (Signed)
Patient was assessed and managed by nursing staff during this encounter. I have reviewed the chart and agree with the documentation and plan. I have also made any necessary editorial changes.  Renee Harder, CNM 03/18/2021 12:10 PM

## 2021-03-30 ENCOUNTER — Telehealth: Payer: Self-pay

## 2021-03-30 NOTE — Telephone Encounter (Signed)
Pt called requesting something for nausea.   Called pt and left message that I am returning her call and that I am also sending a MyChart message that she can respond to.  MyChart message sent.    [name]  03/30/21

## 2021-03-31 ENCOUNTER — Inpatient Hospital Stay (HOSPITAL_COMMUNITY)
Admission: EM | Admit: 2021-03-31 | Discharge: 2021-04-01 | Disposition: A | Discharge status: Home / Self Care | Payer: 59 | Attending: Obstetrics and Gynecology | Admitting: Obstetrics and Gynecology

## 2021-03-31 ENCOUNTER — Other Ambulatory Visit: Payer: Self-pay

## 2021-03-31 ENCOUNTER — Encounter (HOSPITAL_COMMUNITY): Payer: Self-pay | Admitting: Emergency Medicine

## 2021-03-31 DIAGNOSIS — O99321 Drug use complicating pregnancy, first trimester: Secondary | ICD-10-CM | POA: Insufficient documentation

## 2021-03-31 DIAGNOSIS — R112 Nausea with vomiting, unspecified: Secondary | ICD-10-CM

## 2021-03-31 DIAGNOSIS — Z3A01 Less than 8 weeks gestation of pregnancy: Secondary | ICD-10-CM | POA: Insufficient documentation

## 2021-03-31 DIAGNOSIS — O99281 Endocrine, nutritional and metabolic diseases complicating pregnancy, first trimester: Secondary | ICD-10-CM | POA: Diagnosis not present

## 2021-03-31 DIAGNOSIS — E860 Dehydration: Secondary | ICD-10-CM

## 2021-03-31 DIAGNOSIS — O211 Hyperemesis gravidarum with metabolic disturbance: Secondary | ICD-10-CM | POA: Insufficient documentation

## 2021-03-31 DIAGNOSIS — F12188 Cannabis abuse with other cannabis-induced disorder: Secondary | ICD-10-CM | POA: Diagnosis present

## 2021-03-31 DIAGNOSIS — F1290 Cannabis use, unspecified, uncomplicated: Secondary | ICD-10-CM | POA: Diagnosis not present

## 2021-03-31 DIAGNOSIS — Z79899 Other long term (current) drug therapy: Secondary | ICD-10-CM | POA: Insufficient documentation

## 2021-03-31 DIAGNOSIS — Z8759 Personal history of other complications of pregnancy, childbirth and the puerperium: Secondary | ICD-10-CM | POA: Diagnosis present

## 2021-03-31 LAB — CBC
HCT: 34 % — ABNORMAL LOW (ref 36.0–46.0)
Hemoglobin: 11.5 g/dL — ABNORMAL LOW (ref 12.0–15.0)
MCH: 26.9 pg (ref 26.0–34.0)
MCHC: 33.8 g/dL (ref 30.0–36.0)
MCV: 79.4 fL — ABNORMAL LOW (ref 80.0–100.0)
Platelets: 325 10*3/uL (ref 150–400)
RBC: 4.28 MIL/uL (ref 3.87–5.11)
RDW: 15.6 % — ABNORMAL HIGH (ref 11.5–15.5)
WBC: 9.4 10*3/uL (ref 4.0–10.5)
nRBC: 0 % (ref 0.0–0.2)

## 2021-03-31 LAB — URINALYSIS, ROUTINE W REFLEX MICROSCOPIC
Bilirubin Urine: NEGATIVE
Glucose, UA: NEGATIVE mg/dL
Hgb urine dipstick: NEGATIVE
Ketones, ur: 80 mg/dL — AB
Nitrite: NEGATIVE
Protein, ur: 100 mg/dL — AB
Specific Gravity, Urine: 1.032 — ABNORMAL HIGH (ref 1.005–1.030)
pH: 5 (ref 5.0–8.0)

## 2021-03-31 LAB — COMPREHENSIVE METABOLIC PANEL
ALT: 21 U/L (ref 0–44)
AST: 20 U/L (ref 15–41)
Albumin: 3.9 g/dL (ref 3.5–5.0)
Alkaline Phosphatase: 74 U/L (ref 38–126)
Anion gap: 12 (ref 5–15)
BUN: 9 mg/dL (ref 6–20)
CO2: 22 mmol/L (ref 22–32)
Calcium: 9.4 mg/dL (ref 8.9–10.3)
Chloride: 100 mmol/L (ref 98–111)
Creatinine, Ser: 0.69 mg/dL (ref 0.44–1.00)
GFR, Estimated: >60 mL/min (ref >60)
Glucose, Bld: 141 mg/dL — ABNORMAL HIGH (ref 70–99)
Potassium: 3.6 mmol/L (ref 3.5–5.1)
Sodium: 134 mmol/L — ABNORMAL LOW (ref 135–145)
Total Bilirubin: 0.6 mg/dL (ref 0.3–1.2)
Total Protein: 7.9 g/dL (ref 6.5–8.1)

## 2021-03-31 MED ORDER — PYRIDOXINE HCL 100 MG/ML IJ SOLN
100.0000 mg | Freq: Once | INTRAMUSCULAR | Status: AC
  Administered 2021-03-31: 100 mg via INTRAVENOUS
  Filled 2021-03-31: qty 1

## 2021-03-31 MED ORDER — SODIUM CHLORIDE 0.9 % IV SOLN: Freq: Once | INTRAVENOUS | Status: AC

## 2021-03-31 MED ORDER — PROMETHAZINE HCL 25 MG RE SUPP: 25.0000 mg | Freq: Four times a day (QID) | RECTAL | 0 refills | Status: DC | PRN

## 2021-03-31 MED ORDER — DEXAMETHASONE SODIUM PHOSPHATE 10 MG/ML IJ SOLN
10.0000 mg | Freq: Once | INTRAMUSCULAR | Status: AC
  Administered 2021-03-31: 10 mg via INTRAVENOUS
  Filled 2021-03-31: qty 1

## 2021-03-31 MED ORDER — SCOPOLAMINE 1 MG/3DAYS TD PT72
1.0000 | MEDICATED_PATCH | Freq: Once | TRANSDERMAL | Status: DC
  Administered 2021-03-31: 1.5 mg via TRANSDERMAL
  Filled 2021-03-31: qty 1

## 2021-03-31 MED ORDER — ONDANSETRON HCL 4 MG/2ML IJ SOLN
4.0000 mg | Freq: Once | INTRAMUSCULAR | Status: AC
Start: 2021-03-31 — End: 2021-03-31
  Administered 2021-03-31: 4 mg via INTRAVENOUS
  Filled 2021-03-31: qty 2

## 2021-03-31 NOTE — ED Provider Triage Note (Signed)
Emergency Medicine Provider Triage Evaluation Note  Carrie [name] , a 31 y.o. female  was evaluated in triage.  Pt complains of nausea and vomiting for two days. Unable to keep down PO. No abdominal pain. She has associated white vaginal discharge. She is [redacted] weeks pregnant. She is a [id number]  Review of Systems  Positive: Nausea, vomiting Negative:   Physical Exam  Ht 5' 1.5" (1.562 m)    Wt 81.2 kg    BMI 33.27 kg/m  Gen:   Awake, no distress   Resp:  Normal effort  MSK:   Moves extremities without difficulty  Other:    Medical Decision Making  Medically screening exam initiated at 6:53 PM.  Appropriate orders placed.  Carrie [name] was informed that the remainder of the evaluation will be completed by another provider, this initial triage assessment does not replace that evaluation, and the importance of remaining in the ED until their evaluation is complete.  Plan to call [facility]U. MD over there has accepted patient.    Claudie Leach, [place] 03/31/21 1858

## 2021-03-31 NOTE — ED Triage Notes (Signed)
Patient reports unable to eat x 2 days. States she is [redacted] weeks pregnant. Reports white vaginal discharge. No OBGYN.

## 2021-03-31 NOTE — MAU Note (Addendum)
..  Carrie [name] is a 30 y.o. at [redacted]w[redacted]d here in MAU reporting: N/V for last 3 days, pt reports not able to keep anything down. Pt reports vomiting every 3 mins. Pt states able to drink or eat. Pt feels fatigued and uncomfortable. Pt states has not voided in hours. Pt denies VB, cramping, LOF, and abnormal discharge. Pt took @1445  phernergan suppository today.   Onset of complaint: 3 days ago  Pain score: 0/10 Vitals:   03/31/21 1900  BP: 125/74  Pulse: 75  Resp: 18  Temp: 98 F (36.7 C)  SpO2: 100%   Lab orders placed from triage:   UA

## 2021-03-31 NOTE — MAU Provider Note (Signed)
Chief Complaint: Emesis (Pregnant)   Event Date/Time   First Provider Initiated Contact with Patient 03/31/21 2029        SUBJECTIVE HPI: Carrie [name] is a 31 y.o. [id number] at [redacted]w[redacted]d by LMP who presents to maternity admissions reporting nausea and vomiting for 3 days. States cannot keep anything down, vomits q79min.  Took Phenergan earlier today but "it doesn't work".  She denies vaginal bleeding, urinary symptoms, h/a, dizziness, or fever/chills.   RN states pt has not vomited since being here.  Pt states "Im holding it in".  Emesis  This is a recurrent problem. The current episode started in the past 7 days. The problem occurs more than 10 times per day. The problem has been gradually worsening. There has been no fever. Pertinent negatives include no abdominal pain, chest pain, chills, diarrhea, fever or myalgias. Treatments tried: phenergan earlier today.   RN Note: Marland KitchenMarland KitchenDaryl [name] is a 31 y.o. at [redacted]w[redacted]d here in MAU reporting: N/V for last 3 days, pt reports not able to keep anything down. Pt reports vomiting every 3 mins. Pt states able to drink or eat. Pt feels fatigued and uncomfortable. Pt states has not voided in hours. Pt denies VB, cramping, LOF, and abnormal discharge. Pt took @1445  phernergan suppository today.   Onset of complaint: 3 days ago  Pain score: 0/10   Past Medical History:  Diagnosis Date   Asthma    Bipolar 2 disorder (HCC)    Psychiatric disorder    Vitamin D deficiency disease    Past Surgical History:  Procedure Laterality Date   TONSILLECTOMY     Social History   Socioeconomic History   Marital status: Married    Spouse name: Not on file   Number of children: Not on file   Years of education: Not on file   Highest education level: Not on file  Occupational History   Not on file  Tobacco Use   Smoking status: Never   Smokeless tobacco: Never  Vaping Use   Vaping Use: Never used  Substance and Sexual Activity   Alcohol use: No   Drug use: No    Sexual activity: Not Currently    Birth control/protection: None, Condom  Other Topics Concern   Not on file  Social History Narrative   Not on file   Social Determinants of Health   Financial Resource Strain: Not on file  Food Insecurity: Not on file  Transportation Needs: Not on file  Physical Activity: Not on file  Stress: Not on file  Social Connections: Not on file  Intimate Partner Violence: Not on file   No current facility-administered medications on file prior to encounter.   Current Outpatient Medications on File Prior to Encounter  Medication Sig Dispense Refill   albuterol (VENTOLIN HFA) 108 (90 Base) MCG/ACT inhaler Inhale 1 puff into the lungs every 6 (six) hours as needed for wheezing or shortness of breath. 18 g 5   amoxicillin-clavulanate (AUGMENTIN) 500-125 MG tablet SMARTSIG:1 Tablet(s) By Mouth Every 12 Hours     budesonide-formoterol (SYMBICORT) 160-4.5 MCG/ACT inhaler Inhale 2 puffs into the lungs 2 (two) times daily. 1 Inhaler 2   Prenatal Vit-Fe Fumarate-FA (PRENATAL VITAMINS) 28-0.8 MG TABS Take 1 tablet by mouth daily. 30 tablet 11   Prenatal Vit-Fe Fumarate-FA (PRETAB) 29-1 MG TABS Take 1 tablet by mouth daily. 90 tablet 3   promethazine (PHENERGAN) 25 MG suppository Place 1 suppository (25 mg total) rectally every 6 (six) hours as needed for nausea or  vomiting. 12 each 0   montelukast (SINGULAIR) 10 MG tablet Take 1 tablet (10 mg total) by mouth at bedtime. (Patient not taking: Reported on 03/18/2021) 90 tablet 3   sertraline (ZOLOFT) 50 MG tablet Take 1 tab daily for 2 weeks, then increase to 2 tabs daily (Patient not taking: Reported on 03/18/2021) 60 tablet 0   No Known Allergies  I have reviewed patient's Past Medical Hx, Surgical Hx, Family Hx, Social Hx, medications and allergies.   ROS:  Review of Systems  Constitutional:  Negative for chills and fever.  Cardiovascular:  Negative for chest pain.  Gastrointestinal:  Positive for vomiting. Negative  for abdominal pain and diarrhea.  Musculoskeletal:  Negative for myalgias.  Review of Systems  Other systems negative   Physical Exam  Physical Exam Patient Vitals for the past 24 hrs:  BP Temp Temp src Pulse Resp SpO2 Height Weight  03/31/21 2009 117/63 97.8 F (36.6 C) Oral 84 18 100 % -- 77.9 kg  03/31/21 1900 125/74 98 F (36.7 C) Oral 75 18 100 % -- --  03/31/21 1848 -- -- -- -- -- -- 5' 1.5" (1.562 m) 81.2 kg   Constitutional: Well-developed female in no acute distress.  Cardiovascular: normal rate, S1S2, no ectopy Respiratory: normal effort, Chest CTAB GI: Abd soft, non-tender. Pos BS  MS: Extremities nontender, no edema, normal ROM Neurologic: Alert and oriented x 4.  GU: Neg CVAT.  LAB RESULTS Results for orders placed or performed during the hospital encounter of 03/31/21 (from the past 24 hour(s))  Urinalysis, Routine w reflex microscopic Urine, Clean Catch     Status: Abnormal   Collection Time: 03/31/21  8:41 PM  Result Value Ref Range   Color, Urine AMBER (A) YELLOW   APPearance HAZY (A) CLEAR   Specific Gravity, Urine 1.032 (H) 1.005 - 1.030   pH 5.0 5.0 - 8.0   Glucose, UA NEGATIVE NEGATIVE mg/dL   Hgb urine dipstick NEGATIVE NEGATIVE   Bilirubin Urine NEGATIVE NEGATIVE   Ketones, ur 80 (A) NEGATIVE mg/dL   Protein, ur 361 (A) NEGATIVE mg/dL   Nitrite NEGATIVE NEGATIVE   Leukocytes,Ua TRACE (A) NEGATIVE   RBC / HPF 0-5 0 - 5 RBC/hpf   WBC, UA 0-5 0 - 5 WBC/hpf   Bacteria, UA FEW (A) NONE SEEN   Squamous Epithelial / LPF 6-10 0 - 5   Mucus PRESENT   Comprehensive metabolic panel     Status: Abnormal   Collection Time: 03/31/21  9:13 PM  Result Value Ref Range   Sodium 134 (L) 135 - 145 mmol/L   Potassium 3.6 3.5 - 5.1 mmol/L   Chloride 100 98 - 111 mmol/L   CO2 22 22 - 32 mmol/L   Glucose, Bld 141 (H) 70 - 99 mg/dL   BUN 9 6 - 20 mg/dL   Creatinine, Ser 4.43 0.44 - 1.00 mg/dL   Calcium 9.4 8.9 - 15.4 mg/dL   Total Protein 7.9 6.5 - 8.1 g/dL    Albumin 3.9 3.5 - 5.0 g/dL   AST 20 15 - 41 U/L   ALT 21 0 - 44 U/L   Alkaline Phosphatase 74 38 - 126 U/L   Total Bilirubin 0.6 0.3 - 1.2 mg/dL   GFR, Estimated >00 >86 mL/min   Anion gap 12 5 - 15  CBC     Status: Abnormal   Collection Time: 03/31/21  9:13 PM  Result Value Ref Range   WBC 9.4 4.0 - 10.5 K/uL  RBC 4.28 3.87 - 5.11 MIL/uL   Hemoglobin 11.5 (L) 12.0 - 15.0 g/dL   HCT 34.2 (L) 87.6 - 81.1 %   MCV 79.4 (L) 80.0 - 100.0 fL   MCH 26.9 26.0 - 34.0 pg   MCHC 33.8 30.0 - 36.0 g/dL   RDW 57.2 (H) 62.0 - 35.5 %   Platelets 325 150 - 400 K/uL   nRBC 0.0 0.0 - 0.2 %  \   IMAGING No results found.  MAU Management/MDM: Ordered UA, CBC, CMET   These are normal UA showed dehydration  We gave her 3 liters of IV fluid, zofran, Scopolamine pathc, and Pyridoxine.  She stated she still "felt really bad" and we added Decadron.  She told RN she thinks this is due to her having "CHS".  When asked, she was referring to Cannaboid Hyperemesis.  She told RN she stopped MJ 3 weeks ago.  When I discussed what the usual symptoms are, and that stopping MJ would stop symptoms of CHS, she states that she has been smoking over the last few days "because I was sick and it might help"> Discussed cessation is the only remedy other than antiemetics and fluids   While here, she did not vomit at all.  Did cough up some saliva, but never had any emesis. Requesting [place] to see how her baby is.   ASSESSMENT Pregnancy at [redacted]w[redacted]d Nausea and vomiting Dehydration Cannaboid Hyperemesis Syndrome  PLAN Discharge home Rx Protonix for acid reduction Rx Zofran for nausea Rx Scopolamine patches Recommend stopping marijuana Will order outpatient [place] for dating Has New OB appt already  Pt stable at time of discharge. Encouraged to return here if she develops worsening of symptoms, increase in pain, fever, or other concerning symptoms.    Wynelle Bourgeois CNM, MSN Certified Nurse-Midwife 03/31/2021  8:29  PM

## 2021-04-01 ENCOUNTER — Encounter (HOSPITAL_COMMUNITY): Payer: Self-pay

## 2021-04-01 DIAGNOSIS — R112 Nausea with vomiting, unspecified: Secondary | ICD-10-CM

## 2021-04-01 DIAGNOSIS — F1290 Cannabis use, unspecified, uncomplicated: Secondary | ICD-10-CM | POA: Diagnosis not present

## 2021-04-01 DIAGNOSIS — Z8759 Personal history of other complications of pregnancy, childbirth and the puerperium: Secondary | ICD-10-CM | POA: Diagnosis present

## 2021-04-01 DIAGNOSIS — Z3A01 Less than 8 weeks gestation of pregnancy: Secondary | ICD-10-CM | POA: Diagnosis not present

## 2021-04-01 DIAGNOSIS — F12188 Cannabis abuse with other cannabis-induced disorder: Secondary | ICD-10-CM | POA: Diagnosis present

## 2021-04-01 MED ORDER — PANTOPRAZOLE SODIUM 20 MG PO TBEC: 20.0000 mg | DELAYED_RELEASE_TABLET | Freq: Every day | ORAL | 0 refills | Status: DC

## 2021-04-01 MED ORDER — SCOPOLAMINE 1 MG/3DAYS TD PT72: 1.0000 | MEDICATED_PATCH | TRANSDERMAL | 12 refills | Status: DC

## 2021-04-01 MED ORDER — ONDANSETRON 4 MG PO TBDP: 4.0000 mg | ORAL_TABLET | Freq: Four times a day (QID) | ORAL | 0 refills | Status: DC | PRN

## 2021-04-03 ENCOUNTER — Inpatient Hospital Stay (HOSPITAL_COMMUNITY)
Admission: AD | Admit: 2021-04-03 | Discharge: 2021-04-03 | Disposition: A | Discharge status: Home / Self Care | Payer: 59 | Attending: Obstetrics & Gynecology | Admitting: Obstetrics & Gynecology

## 2021-04-03 ENCOUNTER — Other Ambulatory Visit: Payer: Self-pay

## 2021-04-03 ENCOUNTER — Encounter (HOSPITAL_COMMUNITY): Payer: Self-pay | Admitting: Family Medicine

## 2021-04-03 DIAGNOSIS — E876 Hypokalemia: Secondary | ICD-10-CM | POA: Diagnosis not present

## 2021-04-03 DIAGNOSIS — O219 Vomiting of pregnancy, unspecified: Secondary | ICD-10-CM | POA: Insufficient documentation

## 2021-04-03 DIAGNOSIS — Z3A08 8 weeks gestation of pregnancy: Secondary | ICD-10-CM | POA: Diagnosis not present

## 2021-04-03 DIAGNOSIS — O210 Mild hyperemesis gravidarum: Secondary | ICD-10-CM | POA: Diagnosis not present

## 2021-04-03 LAB — CBC
HCT: 31 % — ABNORMAL LOW (ref 36.0–46.0)
Hemoglobin: 10.6 g/dL — ABNORMAL LOW (ref 12.0–15.0)
MCH: 26.8 pg (ref 26.0–34.0)
MCHC: 34.2 g/dL (ref 30.0–36.0)
MCV: 78.5 fL — ABNORMAL LOW (ref 80.0–100.0)
Platelets: 330 10*3/uL (ref 150–400)
RBC: 3.95 MIL/uL (ref 3.87–5.11)
RDW: 15.4 % (ref 11.5–15.5)
WBC: 9.6 10*3/uL (ref 4.0–10.5)
nRBC: 0 % (ref 0.0–0.2)

## 2021-04-03 LAB — COMPREHENSIVE METABOLIC PANEL
ALT: 74 U/L — ABNORMAL HIGH (ref 0–44)
AST: 52 U/L — ABNORMAL HIGH (ref 15–41)
Albumin: 3.4 g/dL — ABNORMAL LOW (ref 3.5–5.0)
Alkaline Phosphatase: 66 U/L (ref 38–126)
Anion gap: 13 (ref 5–15)
BUN: <5 mg/dL — ABNORMAL LOW (ref 6–20)
CO2: 23 mmol/L (ref 22–32)
Calcium: 8.8 mg/dL — ABNORMAL LOW (ref 8.9–10.3)
Chloride: 97 mmol/L — ABNORMAL LOW (ref 98–111)
Creatinine, Ser: 0.52 mg/dL (ref 0.44–1.00)
GFR, Estimated: >60 mL/min (ref >60)
Glucose, Bld: 88 mg/dL (ref 70–99)
Potassium: 3 mmol/L — ABNORMAL LOW (ref 3.5–5.1)
Sodium: 133 mmol/L — ABNORMAL LOW (ref 135–145)
Total Bilirubin: 1.4 mg/dL — ABNORMAL HIGH (ref 0.3–1.2)
Total Protein: 7 g/dL (ref 6.5–8.1)

## 2021-04-03 MED ORDER — SCOPOLAMINE 1 MG/3DAYS TD PT72
1.0000 | MEDICATED_PATCH | TRANSDERMAL | Status: DC
  Administered 2021-04-03: 1.5 mg via TRANSDERMAL
  Filled 2021-04-03: qty 1

## 2021-04-03 MED ORDER — PROMETHAZINE HCL 25 MG PO TABS: 12.5000 mg | ORAL_TABLET | Freq: Four times a day (QID) | ORAL | 0 refills | Status: DC | PRN

## 2021-04-03 MED ORDER — PROCHLORPERAZINE EDISYLATE 10 MG/2ML IJ SOLN
10.0000 mg | Freq: Once | INTRAMUSCULAR | Status: AC
  Administered 2021-04-03: 10 mg via INTRAVENOUS
  Filled 2021-04-03: qty 2

## 2021-04-03 MED ORDER — SODIUM CHLORIDE 0.9 % IV SOLN
25.0000 mg | Freq: Once | INTRAVENOUS | Status: AC
  Administered 2021-04-03: 25 mg via INTRAVENOUS
  Filled 2021-04-03: qty 1

## 2021-04-03 MED ORDER — POTASSIUM CHLORIDE CRYS ER 20 MEQ PO TBCR: 40.0000 meq | EXTENDED_RELEASE_TABLET | Freq: Two times a day (BID) | ORAL | 0 refills | Status: DC

## 2021-04-03 MED ORDER — LACTATED RINGERS IV BOLUS
1000.0000 mL | Freq: Once | INTRAVENOUS | Status: AC
  Administered 2021-04-03: 1000 mL via INTRAVENOUS

## 2021-04-03 MED ORDER — POTASSIUM CHLORIDE 10 MEQ/100ML IV SOLN
10.0000 meq | INTRAVENOUS | Status: AC
  Administered 2021-04-03 (×2): 10 meq via INTRAVENOUS
  Filled 2021-04-03 (×2): qty 100

## 2021-04-03 NOTE — MAU Provider Note (Signed)
Carrie [name] is a 31 y.o. [phone] at [redacted]w[redacted]d who presents to MAU today with complaint of ongoing nausea and vomiting. She denies vaginal bleeding, she reports abdominal pain when she vomits or sneezes. She denies vaginal discharge, vaginal bleeding.    She denies any history of Blood pressure issues, diabetes, c/sections.    She says that  hot shower works.    History    CSN: [id number]   Arrival date and time: 04/03/21 6629    Event Date/Time   First Provider Initiated Contact with Patient 04/03/21 [phone]          Chief Complaint  Patient presents with   Emesis    Emesis  This is a new problem. The current episode started in the past 7 days. The problem occurs 5 to 10 times per day. The problem has been unchanged. The emesis has an appearance of bile. There has been no fever. Associated symptoms include abdominal pain.    OB History       Gravida  4   Para  2   Term  2   Preterm      AB  1   Living  2        SAB      IAB  1   Ectopic      Multiple  0   Live Births  2                   Past Medical History:  Diagnosis Date   Asthma     Bipolar 2 disorder (HCC)     Psychiatric disorder     Vitamin D deficiency disease             Past Surgical History:  Procedure Laterality Date   TONSILLECTOMY               Family History  Problem Relation Age of Onset   ADD / ADHD Mother          suspected   Depression Sister     ADD / ADHD Brother     Seizures Son     Mental illness Paternal Uncle          schizophrenia, bipolar, ADHD   Cancer Neg Hx     Diabetes Neg Hx     Heart disease Neg Hx     Hypertension Neg Hx     Stroke Neg Hx     COPD Neg Hx        Social History        Tobacco Use   Smoking status: Never   Smokeless tobacco: Never  Vaping Use   Vaping Use: Never used  Substance Use Topics   Alcohol use: No   Drug use: No      Allergies: No Known Allergies          Medications Prior to Admission  Medication Sig  Dispense Refill Last Dose   albuterol (VENTOLIN HFA) 108 (90 Base) MCG/ACT inhaler Inhale 1 puff into the lungs every 6 (six) hours as needed for wheezing or shortness of breath. 18 g 5     amoxicillin-clavulanate (AUGMENTIN) 500-125 MG tablet SMARTSIG:1 Tablet(s) By Mouth Every 12 Hours         budesonide-formoterol (SYMBICORT) 160-4.5 MCG/ACT inhaler Inhale 2 puffs into the lungs 2 (two) times daily. 1 Inhaler 2     montelukast (SINGULAIR) 10 MG tablet Take 1 tablet (10 mg total) by mouth at bedtime. (Patient not  taking: Reported on 03/18/2021) 90 tablet 3     ondansetron (ZOFRAN-ODT) 4 MG disintegrating tablet Take 1 tablet (4 mg total) by mouth every 6 (six) hours as needed for nausea. 20 tablet 0     pantoprazole (PROTONIX) 20 MG tablet Take 1 tablet (20 mg total) by mouth daily. 30 tablet 0     Prenatal Vit-Fe Fumarate-FA (PRENATAL VITAMINS) 28-0.8 MG TABS Take 1 tablet by mouth daily. 30 tablet 11     Prenatal Vit-Fe Fumarate-FA (PRETAB) 29-1 MG TABS Take 1 tablet by mouth daily. 90 tablet 3     promethazine (PHENERGAN) 25 MG suppository Place 1 suppository (25 mg total) rectally every 6 (six) hours as needed for nausea or vomiting. 12 each 0     scopolamine (TRANSDERM-SCOP) 1 MG/3DAYS Place 1 patch (1.5 mg total) onto the skin every 3 (three) days. 10 patch 12     sertraline (ZOLOFT) 50 MG tablet Take 1 tab daily for 2 weeks, then increase to 2 tabs daily (Patient not taking: Reported on 03/18/2021) 60 tablet 0        Review of Systems  Constitutional: Negative.   HENT: Negative.    Respiratory: Negative.    Cardiovascular: Negative.   Gastrointestinal:  Positive for abdominal pain and vomiting.       When she vomits  Neurological: Negative.   Physical Exam    Blood pressure (!) 143/87, pulse 76, temperature 98 F (36.7 C), temperature source Oral, resp. rate 18, height 5' 1.5" (1.562 m), weight 78.9 kg.   Physical Exam Constitutional:      Appearance: Normal appearance.  HENT:      Head: Normocephalic.  Musculoskeletal:        General: Normal range of motion.  Skin:    General: Skin is warm.  Neurological:     Mental Status: She is alert.      MAU Course  Procedures   MDM -after multiple attempts, IV access gained. Will place Scop patch, give compazine injection, give LR bolus fluids and then switch to phenergan bag.  -Patient unable to void -Patient declined Zofran-states "that gives me the shakes"  Charlesetta Garibaldi [facility] 04/03/2021, 8:04 AM   Labs reviewed. K-run ordered.  Bedside US by Dr. Glennis Brink, active fetus, +cardiac activity, subj. nml AFV  1350: Tolerating po liquids and crackers, no further emesis. Stable for discharge home.   Assessment and Plan   1. [redacted] weeks gestation of pregnancy   2. Morning sickness   3. Hypokalemia    Discharge home Follow up at [facility] as scheduled Rx Phenergan po Rx Kdur  Allergies as of 04/03/2021   No Known Allergies      Medication List     STOP taking these medications    amoxicillin-clavulanate 500-125 MG tablet Commonly known as: AUGMENTIN   montelukast 10 MG tablet Commonly known as: SINGULAIR   sertraline 50 MG tablet Commonly known as: ZOLOFT       TAKE these medications    albuterol 108 (90 Base) MCG/ACT inhaler Commonly known as: VENTOLIN HFA Inhale 1 puff into the lungs every 6 (six) hours as needed for wheezing or shortness of breath.   budesonide-formoterol 160-4.5 MCG/ACT inhaler Commonly known as: SYMBICORT Inhale 2 puffs into the lungs 2 (two) times daily.   ondansetron 4 MG disintegrating tablet Commonly known as: ZOFRAN-ODT Take 1 tablet (4 mg total) by mouth every 6 (six) hours as needed for nausea.   pantoprazole 20 MG tablet Commonly known as: PROTONIX Take  1 tablet (20 mg total) by mouth daily.   potassium chloride SA 20 MEQ tablet Commonly known as: KLOR-CON M Take 2 tablets (40 mEq total) by mouth 2 (two) times daily.   PreTAB 29-1 MG Tabs Take 1  tablet by mouth daily. What changed: Another medication with the same name was removed. Continue taking this medication, and follow the directions you see here.   promethazine 25 MG suppository Commonly known as: PHENERGAN Place 1 suppository (25 mg total) rectally every 6 (six) hours as needed for nausea or vomiting. What changed: Another medication with the same name was added. Make sure you understand how and when to take each.   promethazine 25 MG tablet Commonly known as: PHENERGAN Take 0.5-1 tablets (12.5-25 mg total) by mouth every 6 (six) hours as needed for nausea or vomiting. What changed: You were already taking a medication with the same name, and this prescription was added. Make sure you understand how and when to take each.   scopolamine 1 MG/3DAYS Commonly known as: TRANSDERM-SCOP Place 1 patch (1.5 mg total) onto the skin every 3 (three) days.        Donette Larry, CNM  04/03/2021 1:53 PM

## 2021-04-03 NOTE — MAU Note (Signed)
Pt stated she has not eaten anything since Tuesday. Has tried to keep liquids down but unable. Taking prescribed meds without relief. Took phenergan sup about 6 hours ago no relief. Come mild abd pain and cramping on and off.

## 2021-04-03 NOTE — MAU Provider Note (Incomplete)
Ms. Cyndi Dewolf is a 31 y.o. [phone] at [redacted]w[redacted]d who presents to MAU today with complaint of ongoing nausea and vomiting. She denies vaginal bleeding, she reports abdominal pain when she vomits or sneezes. She denies vaginal discharge, vaginal bleeding.   She denies any history of Blood pressure issues, diabetes, c/sections.   She says that  hot shower works.   History     CSN: [id number]  Arrival date and time: 04/03/21 1779   Event Date/Time   First Provider Initiated Contact with Patient 04/03/21 [phone]      Chief Complaint  Patient presents with   Emesis   Emesis  This is a new problem. The current episode started in the past 7 days. The problem occurs 5 to 10 times per day. The problem has been unchanged. The emesis has an appearance of bile. There has been no fever. Associated symptoms include abdominal pain.   OB History     Gravida  4   Para  2   Term  2   Preterm      AB  1   Living  2      SAB      IAB  1   Ectopic      Multiple  0   Live Births  2           Past Medical History:  Diagnosis Date   Asthma    Bipolar 2 disorder (HCC)    Psychiatric disorder    Vitamin D deficiency disease     Past Surgical History:  Procedure Laterality Date   TONSILLECTOMY      Family History  Problem Relation Age of Onset   ADD / ADHD Mother        suspected   Depression Sister    ADD / ADHD Brother    Seizures Son    Mental illness Paternal Uncle        schizophrenia, bipolar, ADHD   Cancer Neg Hx    Diabetes Neg Hx    Heart disease Neg Hx    Hypertension Neg Hx    Stroke Neg Hx    COPD Neg Hx     Social History   Tobacco Use   Smoking status: Never   Smokeless tobacco: Never  Vaping Use   Vaping Use: Never used  Substance Use Topics   Alcohol use: No   Drug use: No    Allergies: No Known Allergies  Medications Prior to Admission  Medication Sig Dispense Refill Last Dose   albuterol (VENTOLIN HFA) 108  (90 Base) MCG/ACT inhaler Inhale 1 puff into the lungs every 6 (six) hours as needed for wheezing or shortness of breath. 18 g 5    amoxicillin-clavulanate (AUGMENTIN) 500-125 MG tablet SMARTSIG:1 Tablet(s) By Mouth Every 12 Hours      budesonide-formoterol (SYMBICORT) 160-4.5 MCG/ACT inhaler Inhale 2 puffs into the lungs 2 (two) times daily. 1 Inhaler 2    montelukast (SINGULAIR) 10 MG tablet Take 1 tablet (10 mg total) by mouth at bedtime. (Patient not taking: Reported on 03/18/2021) 90 tablet 3    ondansetron (ZOFRAN-ODT) 4 MG disintegrating tablet Take 1 tablet (4 mg total) by mouth every 6 (six) hours as needed for nausea. 20 tablet 0    pantoprazole (PROTONIX) 20 MG tablet Take 1 tablet (20 mg total) by mouth daily. 30 tablet 0    Prenatal Vit-Fe Fumarate-FA (PRENATAL VITAMINS) 28-0.8 MG TABS Take 1 tablet by mouth daily. 30 tablet 11  Prenatal Vit-Fe Fumarate-FA (PRETAB) 29-1 MG TABS Take 1 tablet by mouth daily. 90 tablet 3    promethazine (PHENERGAN) 25 MG suppository Place 1 suppository (25 mg total) rectally every 6 (six) hours as needed for nausea or vomiting. 12 each 0    scopolamine (TRANSDERM-SCOP) 1 MG/3DAYS Place 1 patch (1.5 mg total) onto the skin every 3 (three) days. 10 patch 12    sertraline (ZOLOFT) 50 MG tablet Take 1 tab daily for 2 weeks, then increase to 2 tabs daily (Patient not taking: Reported on 03/18/2021) 60 tablet 0     Review of Systems  Constitutional: Negative.   HENT: Negative.    Respiratory: Negative.    Cardiovascular: Negative.   Gastrointestinal:  Positive for abdominal pain and vomiting.       When she vomits  Neurological: Negative.   Physical Exam   Blood pressure (!) 143/87, pulse 76, temperature 98 F (36.7 C), temperature source Oral, resp. rate 18, height 5' 1.5" (1.562 m), weight 78.9 kg.  Physical Exam Constitutional:      Appearance: Normal appearance.  HENT:     Head: Normocephalic.  Musculoskeletal:        General:  Normal range of motion.  Skin:    General: Skin is warm.  Neurological:     Mental Status: She is alert.    MAU Course  Procedures  MDM -after multiple attempts, IV access gained. Will place Scop patch, give compazine injection, give LR bolus fluids and then switch to phenergan bag.  -Patient unable to void -Patient declined Zofran-states "that gives me the shakes"  Assessment and Plan  ***  Mervyn Skeeters [facility] 04/03/2021, 8:04 AM

## 2021-04-06 ENCOUNTER — Other Ambulatory Visit: Payer: Self-pay

## 2021-04-06 ENCOUNTER — Inpatient Hospital Stay (HOSPITAL_COMMUNITY)
Admission: AD | Admit: 2021-04-06 | Discharge: 2021-04-07 | Disposition: A | Discharge status: Home / Self Care | Payer: 59 | Attending: Obstetrics & Gynecology | Admitting: Obstetrics & Gynecology

## 2021-04-06 ENCOUNTER — Telehealth: Payer: Self-pay | Admitting: *Deleted

## 2021-04-06 ENCOUNTER — Encounter: Payer: Self-pay | Admitting: Emergency Medicine

## 2021-04-06 ENCOUNTER — Emergency Department
Admission: EM | Admit: 2021-04-06 | Discharge: 2021-04-06 | Discharge status: Against Medical Advice | Payer: 59 | Attending: Emergency Medicine | Admitting: Emergency Medicine

## 2021-04-06 DIAGNOSIS — Z5321 Procedure and treatment not carried out due to patient leaving prior to being seen by health care provider: Secondary | ICD-10-CM | POA: Diagnosis not present

## 2021-04-06 DIAGNOSIS — Z3A08 8 weeks gestation of pregnancy: Secondary | ICD-10-CM | POA: Diagnosis not present

## 2021-04-06 DIAGNOSIS — O211 Hyperemesis gravidarum with metabolic disturbance: Secondary | ICD-10-CM | POA: Insufficient documentation

## 2021-04-06 DIAGNOSIS — O99281 Endocrine, nutritional and metabolic diseases complicating pregnancy, first trimester: Secondary | ICD-10-CM | POA: Insufficient documentation

## 2021-04-06 DIAGNOSIS — O26891 Other specified pregnancy related conditions, first trimester: Secondary | ICD-10-CM | POA: Diagnosis present

## 2021-04-06 DIAGNOSIS — E876 Hypokalemia: Secondary | ICD-10-CM | POA: Diagnosis not present

## 2021-04-06 DIAGNOSIS — N898 Other specified noninflammatory disorders of vagina: Secondary | ICD-10-CM | POA: Diagnosis not present

## 2021-04-06 DIAGNOSIS — O23591 Infection of other part of genital tract in pregnancy, first trimester: Secondary | ICD-10-CM | POA: Diagnosis not present

## 2021-04-06 DIAGNOSIS — O10011 Pre-existing essential hypertension complicating pregnancy, first trimester: Secondary | ICD-10-CM | POA: Diagnosis not present

## 2021-04-06 LAB — CBC
HCT: 32 % — ABNORMAL LOW (ref 36.0–46.0)
Hemoglobin: 10.8 g/dL — ABNORMAL LOW (ref 12.0–15.0)
MCH: 26.3 pg (ref 26.0–34.0)
MCHC: 33.8 g/dL (ref 30.0–36.0)
MCV: 77.9 fL — ABNORMAL LOW (ref 80.0–100.0)
Platelets: 345 10*3/uL (ref 150–400)
RBC: 4.11 MIL/uL (ref 3.87–5.11)
RDW: 15.5 % (ref 11.5–15.5)
WBC: 8.6 10*3/uL (ref 4.0–10.5)
nRBC: 0 % (ref 0.0–0.2)

## 2021-04-06 LAB — COMPREHENSIVE METABOLIC PANEL
ALT: 48 U/L — ABNORMAL HIGH (ref 0–44)
AST: 26 U/L (ref 15–41)
Albumin: 3.4 g/dL — ABNORMAL LOW (ref 3.5–5.0)
Alkaline Phosphatase: 76 U/L (ref 38–126)
Anion gap: 14 (ref 5–15)
BUN: <5 mg/dL — ABNORMAL LOW (ref 6–20)
CO2: 25 mmol/L (ref 22–32)
Calcium: 9.1 mg/dL (ref 8.9–10.3)
Chloride: 98 mmol/L (ref 98–111)
Creatinine, Ser: 0.54 mg/dL (ref 0.44–1.00)
GFR, Estimated: >60 mL/min (ref >60)
Glucose, Bld: 107 mg/dL — ABNORMAL HIGH (ref 70–99)
Potassium: 2.7 mmol/L — CL (ref 3.5–5.1)
Sodium: 137 mmol/L (ref 135–145)
Total Bilirubin: 1.5 mg/dL — ABNORMAL HIGH (ref 0.3–1.2)
Total Protein: 6.9 g/dL (ref 6.5–8.1)

## 2021-04-06 LAB — POC URINE PREG, ED: Preg Test, Ur: POSITIVE — AB

## 2021-04-06 LAB — LIPASE, BLOOD: Lipase: 28 U/L (ref 11–51)

## 2021-04-06 LAB — URINALYSIS, ROUTINE W REFLEX MICROSCOPIC
Bilirubin Urine: NEGATIVE
Glucose, UA: NEGATIVE mg/dL
Hgb urine dipstick: NEGATIVE
Ketones, ur: 20 mg/dL — AB
Nitrite: NEGATIVE
Protein, ur: 30 mg/dL — AB
Specific Gravity, Urine: 1.006 (ref 1.005–1.030)
pH: 8 (ref 5.0–8.0)

## 2021-04-06 NOTE — Telephone Encounter (Signed)
Received a voicemail from 04/05/21 2:48PM Stating she needs a nurse to call her to figure out what to do for her situation. States she has been in and out of the hospital and can't work. States work is getting antsy and she needs a doctors note to take her out of work.  States her hands are swollen, was told at hospital her potassium is low, and her blood pressure was high. States she pretty much is going to go back to the hospital if she doesn't get help and states she hasn't ate since last Tuesday.  ?Per chart review do not see that she has went to hospital since she left the message.  ?I called Lloyd and left a message I am returning her call and since I did not reach her, I will send a MyChart message. Please read message and respond or call us back if needed. ?[name] ?

## 2021-04-06 NOTE — ED Triage Notes (Signed)
Pt to ED via EMS from home c/o abd pain x3 days generalized cramping, states n/v and not eating or sleeping well for a couple weeks.  Patient is [redacted]wks pregnant, G3P2.  States has been seen before low potassium and HTN, states is new for this pregnancy.  States vaginal discharge, denies leaking or bleeding.  EMS vitals 99 HR, 125/76 BP, 139 CBG, and given 300cc NS.  Pt A&Ox4, chest rise even and unlabored, skin WNL and in NAD at this time. ?

## 2021-04-07 ENCOUNTER — Other Ambulatory Visit: Payer: Self-pay

## 2021-04-07 ENCOUNTER — Encounter (HOSPITAL_COMMUNITY): Payer: Self-pay | Admitting: Obstetrics & Gynecology

## 2021-04-07 ENCOUNTER — Telehealth (INDEPENDENT_AMBULATORY_CARE_PROVIDER_SITE_OTHER): Payer: 59 | Admitting: *Deleted

## 2021-04-07 DIAGNOSIS — F908 Attention-deficit hyperactivity disorder, other type: Secondary | ICD-10-CM

## 2021-04-07 DIAGNOSIS — O211 Hyperemesis gravidarum with metabolic disturbance: Secondary | ICD-10-CM

## 2021-04-07 DIAGNOSIS — F3181 Bipolar II disorder: Secondary | ICD-10-CM

## 2021-04-07 DIAGNOSIS — E559 Vitamin D deficiency, unspecified: Secondary | ICD-10-CM

## 2021-04-07 DIAGNOSIS — Z3490 Encounter for supervision of normal pregnancy, unspecified, unspecified trimester: Secondary | ICD-10-CM

## 2021-04-07 DIAGNOSIS — E876 Hypokalemia: Secondary | ICD-10-CM | POA: Diagnosis not present

## 2021-04-07 DIAGNOSIS — J4520 Mild intermittent asthma, uncomplicated: Secondary | ICD-10-CM

## 2021-04-07 DIAGNOSIS — O99340 Other mental disorders complicating pregnancy, unspecified trimester: Secondary | ICD-10-CM

## 2021-04-07 DIAGNOSIS — O99320 Drug use complicating pregnancy, unspecified trimester: Secondary | ICD-10-CM

## 2021-04-07 DIAGNOSIS — Z5941 Food insecurity: Secondary | ICD-10-CM

## 2021-04-07 DIAGNOSIS — O210 Mild hyperemesis gravidarum: Secondary | ICD-10-CM

## 2021-04-07 DIAGNOSIS — F419 Anxiety disorder, unspecified: Secondary | ICD-10-CM

## 2021-04-07 DIAGNOSIS — Z3A08 8 weeks gestation of pregnancy: Secondary | ICD-10-CM

## 2021-04-07 DIAGNOSIS — O0990 Supervision of high risk pregnancy, unspecified, unspecified trimester: Secondary | ICD-10-CM | POA: Insufficient documentation

## 2021-04-07 DIAGNOSIS — F12188 Cannabis abuse with other cannabis-induced disorder: Secondary | ICD-10-CM

## 2021-04-07 LAB — HCG, QUANTITATIVE, PREGNANCY: hCG, Beta Chain, Quant, S: 150462 m[IU]/mL — ABNORMAL HIGH (ref <5)

## 2021-04-07 MED ORDER — METOCLOPRAMIDE HCL 5 MG/ML IJ SOLN
10.0000 mg | Freq: Once | INTRAMUSCULAR | Status: AC
Start: 2021-04-07 — End: 2021-04-07
  Administered 2021-04-07: 10 mg via INTRAVENOUS
  Filled 2021-04-07: qty 2

## 2021-04-07 MED ORDER — SCOPOLAMINE 1 MG/3DAYS TD PT72
1.0000 | MEDICATED_PATCH | Freq: Once | TRANSDERMAL | Status: DC
  Administered 2021-04-07: 1.5 mg via TRANSDERMAL
  Filled 2021-04-07: qty 1

## 2021-04-07 MED ORDER — LACTATED RINGERS IV SOLN
INTRAVENOUS | Status: DC
Start: 2021-04-07 — End: 2021-04-07

## 2021-04-07 MED ORDER — FAMOTIDINE IN NACL 20-0.9 MG/50ML-% IV SOLN
20.0000 mg | Freq: Once | INTRAVENOUS | Status: AC
  Administered 2021-04-07: 20 mg via INTRAVENOUS
  Filled 2021-04-07: qty 50

## 2021-04-07 MED ORDER — DIPHENHYDRAMINE HCL 50 MG/ML IJ SOLN
25.0000 mg | Freq: Once | INTRAMUSCULAR | Status: AC
  Administered 2021-04-07: 25 mg via INTRAVENOUS
  Filled 2021-04-07: qty 1

## 2021-04-07 MED ORDER — POTASSIUM CHLORIDE 10 MEQ/100ML IV SOLN
10.0000 meq | Freq: Once | INTRAVENOUS | Status: AC
  Administered 2021-04-07: 10 meq via INTRAVENOUS
  Filled 2021-04-07: qty 100

## 2021-04-07 MED ORDER — LACTATED RINGERS IV BOLUS
1000.0000 mL | Freq: Once | INTRAVENOUS | Status: AC
  Administered 2021-04-07: 1000 mL via INTRAVENOUS

## 2021-04-07 MED ORDER — SODIUM CHLORIDE 0.9 % IV SOLN
8.0000 mg | Freq: Once | INTRAVENOUS | Status: AC
  Administered 2021-04-07: 8 mg via INTRAVENOUS
  Filled 2021-04-07: qty 4

## 2021-04-07 MED ORDER — POTASSIUM CHLORIDE 10 MEQ/100ML IV SOLN
10.0000 meq | INTRAVENOUS | Status: AC
  Administered 2021-04-07 (×2): 10 meq via INTRAVENOUS
  Filled 2021-04-07 (×2): qty 100

## 2021-04-07 NOTE — MAU Note (Signed)
Carrie [name] is a 31 y.o. at [redacted]w[redacted]d here in MAU reporting: she went to [facility] via EMS for nausea/vomiting.. stated EMS started IV and gave her IV fluids. States she was triaged at [facility] but never got back to a room because of a long wait period. Decided to leave and come here. Also reports cramping and spotting for 2 days. Pt actively vomiting in triage.  ? ?Pain Score: 6 ? ?Lab orders placed from triage: U/A  ?

## 2021-04-07 NOTE — MAU Provider Note (Signed)
?History  ?  ? ?[id number] ? ?Arrival date and time: 04/06/21 2355 ?  ? ?Chief Complaint  ?Patient presents with  ? Emesis  ? ? ? ?HPI ?Carrie [name] is a 31 y.o. at [redacted]w[redacted]d with PMHx notable for cannabinoid hyperemesis, who presents for nausea & vomiting. ?This has been an ongoing issue with pregnancy.  Has prescriptions for Phenergan, scopolamine, Protonix, Zofran.  Has scopolamine patch on that was supposed to be changed out today but she did not.  Took dose of Phenergan at 5 PM without relief.  Has not taken other medications today.  States she has vomited too many counts times to count.  Has not tried to eat since last week.  Last bowel movement was 2 weeks ago.  Denies abdominal pain, fever, vaginal bleeding. Last used marijuana 2 weeks ago.  ? ?OB History   ? ? Gravida  ?4  ? Para  ?2  ? Term  ?2  ? Preterm  ?   ? AB  ?1  ? Living  ?2  ?  ? ? SAB  ?   ? IAB  ?1  ? Ectopic  ?   ? Multiple  ?0  ? Live Births  ?2  ?   ?  ?  ? ? ?Past Medical History:  ?Diagnosis Date  ? Asthma   ? Bipolar 2 disorder (HCC)   ? Psychiatric disorder   ? Vitamin D deficiency disease   ? ? ?Past Surgical History:  ?Procedure Laterality Date  ? TONSILLECTOMY    ? ? ?Family History  ?Problem Relation Age of Onset  ? ADD / ADHD Mother   ?     suspected  ? Depression Sister   ? ADD / ADHD Brother   ? Seizures Son   ? Mental illness Paternal Uncle   ?     schizophrenia, bipolar, ADHD  ? Cancer Neg Hx   ? Diabetes Neg Hx   ? Heart disease Neg Hx   ? Hypertension Neg Hx   ? Stroke Neg Hx   ? COPD Neg Hx   ? ? ?No Known Allergies ? ?No current facility-administered medications on file prior to encounter.  ? ?Current Outpatient Medications on File Prior to Encounter  ?Medication Sig Dispense Refill  ? promethazine (PHENERGAN) 25 MG suppository Place 1 suppository (25 mg total) rectally every 6 (six) hours as needed for nausea or vomiting. 12 each 0  ? scopolamine (TRANSDERM-SCOP) 1 MG/3DAYS Place 1 patch (1.5 mg total) onto the skin every 3  (three) days. 10 patch 12  ? albuterol (VENTOLIN HFA) 108 (90 Base) MCG/ACT inhaler Inhale 1 puff into the lungs every 6 (six) hours as needed for wheezing or shortness of breath. 18 g 5  ? budesonide-formoterol (SYMBICORT) 160-4.5 MCG/ACT inhaler Inhale 2 puffs into the lungs 2 (two) times daily. 1 Inhaler 2  ? ondansetron (ZOFRAN-ODT) 4 MG disintegrating tablet Take 1 tablet (4 mg total) by mouth every 6 (six) hours as needed for nausea. 20 tablet 0  ? pantoprazole (PROTONIX) 20 MG tablet Take 1 tablet (20 mg total) by mouth daily. 30 tablet 0  ? potassium chloride SA (KLOR-CON M) 20 MEQ tablet Take 2 tablets (40 mEq total) by mouth 2 (two) times daily. 12 tablet 0  ? Prenatal Vit-Fe Fumarate-FA (PRETAB) 29-1 MG TABS Take 1 tablet by mouth daily. 90 tablet 3  ? promethazine (PHENERGAN) 25 MG tablet Take 0.5-1 tablets (12.5-25 mg total) by mouth every 6 (six) hours as needed  for nausea or vomiting. 30 tablet 0  ? ? ? ?ROS ?Pertinent positives and negative per HPI, all others reviewed and negative ? ?Physical Exam  ? ?BP 134/88   Pulse (!) 105   Temp 98 ?F (36.7 ?C) (Oral)   Resp 18   Ht 5\' 1"  (1.549 m)   Wt 76.9 kg   SpO2 98%   BMI 32.05 kg/m?  ? ?Patient Vitals for the past 24 hrs: ? BP Temp Temp src Pulse Resp SpO2 Height Weight  ?04/07/21 0719 134/88 98 ?F (36.7 ?C) Oral (!) 105 18 -- -- --  ?04/07/21 0023 (!) 140/93 98.2 ?F (36.8 ?C) Oral (!) 109 20 98 % 5\' 1"  (1.549 m) 76.9 kg  ? ? ?Physical Exam ?Vitals and nursing note reviewed.  ?Constitutional:   ?   Appearance: Normal appearance. She is not toxic-appearing.  ?HENT:  ?   Head: Normocephalic and atraumatic.  ?Eyes:  ?   General: No scleral icterus. ?   Conjunctiva/sclera: Conjunctivae normal.  ?Pulmonary:  ?   Effort: Pulmonary effort is normal. No respiratory distress.  ?Neurological:  ?   Mental Status: She is alert.  ?Psychiatric:     ?   Mood and Affect: Mood normal.     ?   Behavior: Behavior normal.  ?  ? ? ?Bedside Ultrasound ?Pt informed that  the ultrasound is considered a limited OB ultrasound and is not intended to be a complete ultrasound exam.  Patient also informed that the ultrasound is not being completed with the intent of assessing for fetal or placental anomalies or any pelvic abnormalities.  Explained that the purpose of today?s ultrasound is to assess for  viability.  Patient acknowledges the purpose of the exam and the limitations of the study.   ? ? ?My interpretation: live IUP with FHR 164 bpm ? ? ?Labs ?Component ?    Latest Ref Rng & Units 04/06/2021  ?Sodium ?    135 - 145 mmol/L 137  ?Potassium ?    3.5 - 5.1 mmol/L 2.7 (LL)  ?Chloride ?    98 - 111 mmol/L 98  ?CO2 ?    22 - 32 mmol/L 25  ?Glucose ?    70 - 99 mg/dL 759 (H)  ?BUN ?    6 - 20 mg/dL <5 (L)  ?Creatinine ?    0.44 - 1.00 mg/dL 1.63  ?Calcium ?    8.9 - 10.3 mg/dL 9.1  ?Total Protein ?    6.5 - 8.1 g/dL 6.9  ?Albumin ?    3.5 - 5.0 g/dL 3.4 (L)  ?AST ?    15 - 41 U/L 26  ?ALT ?    0 - 44 U/L 48 (H)  ?Alkaline Phosphatase ?    38 - 126 U/L 76  ?Total Bilirubin ?    0.3 - 1.2 mg/dL 1.5 (H)  ?GFR, Estimated ?    >60 mL/min >60  ?Anion gap ?    5 - 15 14  ? ? ?Imaging ?No results found. ? ?MAU Course  ?Procedures ?Lab Orders  ?No laboratory test(s) ordered today  ? ?Meds ordered this encounter  ?Medications  ? famotidine (PEPCID) IVPB 20 mg premix  ? lactated ringers bolus 1,000 mL  ? scopolamine (TRANSDERM-SCOP) 1 MG/3DAYS 1.5 mg  ? potassium chloride 10 mEq in 100 mL IVPB  ? metoCLOPramide (REGLAN) injection 10 mg  ? diphenhydrAMINE (BENADRYL) injection 25 mg  ? lactated ringers bolus 1,000 mL  ? potassium chloride  10 mEq in 100 mL IVPB  ? ondansetron (ZOFRAN) 8 mg in sodium chloride 0.9 % 50 mL IVPB  ? lactated ringers infusion  ? ?Imaging Orders  ?No imaging studies ordered today  ? ? ?MDM ?Labs completed at [facility] ED reviewed.  ?Patient given IV fluids, reglan, benaryl, & zofran. Also replaced scopolamine patch. Patient reports relief after IV zofran being given. She has  previously been prescribed multiple medications but states she has not picked all of them up at the pharmacy.  ? ?Given 30 meq of IV potassium while in MAU. Has oral iron prescribed for her.  ? ?Assessment and Plan  ? ?1. Hyperemesis gravidarum with dehydration  ?-Continue to abstain from marijuana ?-Take previously prescribed medications on a schedule  ?2. Hypokalemia due to excessive gastrointestinal loss of potassium  ?-Take oral potassium as previously prescribed  ?3. [redacted] weeks gestation of pregnancy   ? ? ? ?Judeth Horn, NP ?04/07/21 ?8:00 AM ? ? ?

## 2021-04-07 NOTE — Progress Notes (Signed)
New OB Intake ? ?I connected with  Carrie [name] on 04/07/21 at 0930 by MyChart Video Visit and verified that I am speaking with the correct person using two identifiers. Nurse is located at [facility] and pt is located at home. ? ?I discussed the limitations, risks, security and privacy concerns of performing an evaluation and management service by telephone and the availability of in person appointments. I also discussed with the patient that there may be a patient responsible charge related to this service. The patient expressed understanding and agreed to proceed. ? ?I explained I am completing New OB Intake today. We discussed her EDD of 11/11/21 that is based on LMP of 02/04/21. Pt is G4/P2012. I reviewed her allergies, medications, Medical/Surgical/OB history, and appropriate screenings. I informed her of [facility] services. Based on history, this is a/an  pregnancy complicated by Cannabis hyperemesis, bipolar, depression  . Referral to [facility] offered and accepted.  ? ?Patient Active Problem List  ? Diagnosis Date Noted  ? Low blood potassium 04/07/2021  ? Supervision of high risk pregnancy, antepartum 04/07/2021  ? Cannabis hyperemesis syndrome concurrent with and due to cannabis abuse (HCC) 04/01/2021  ? Anxiety 05/29/2019  ? ADHD 03/21/2016  ? Vitamin D deficiency disease   ? Bipolar 2 disorder (HCC)   ? Psychiatric disorder   ? Asthma 07/30/2012  ? Anemia 08/07/2011  ? ? ?Concerns addressed today ? ?Delivery Plans:  ?Plans to deliver at [facility].  ? ?Waterbirth candidate? Interested in waterbirth ? ?MyChart/Babyscripts ?MyChart access verified. I explained pt will have some visits in office and some virtually. Babyscripts instructions given and order placed. Patient verifies receipt of registration text/e-mail.  ? ?Blood Pressure Cuff  ?Patient has private insurance; instructed to purchase blood pressure cuff and bring to first prenatal appt. Explained after first prenatal appt pt will check weekly and document in  Babyscripts. ? ?Weight scale: Patient does  have weight scale.  ? ?Anatomy US ?Explained anatomy US will be scheduled around 19 weeks. Patient reports had dating/ viability ultrasound and scheduled for [facility] 04/19/21. She requests be changed to MFM at [facility]. Appointment changed. Explained Anatomy US will be scheduled at new ob once dating [place] reviewed and EDD verified.  ?  ? ?Labs ?Discussed Avelina Laine genetic screening with patient. Would like both Panorama and Horizon drawn at new OB visit.Also if interested in genetic testing, informed patient she will need AFP 15-21 weeks to complete genetic testing .Routine prenatal labs needed. ? ?Covid Vaccine ?Patient has covid vaccine.  ? ?Is patient a CenteringPregnancy candidate? Declined  ? ?Is patient a Mom+Baby Combined Care candidate? Declined    ? ?Informed patient of [facility] website  and placed link in her AVS.  ? ?Social Determinants of Health ?Food Insecurity: Patient expresses food insecurity. Food Market information given to patient; explained patient may visit at the end of first OB appointment. ?WIC Referral: Patient is interested in referral to [facility].  ?Transportation: Patient denies transportation needs. ?Childcare: Discussed no children allowed at ultrasound appointments. Offered childcare services; patient declines childcare services at this time. ? ?Send link to Pregnancy Navigators ? ? ?Placed OB Box on problem list and updated ? ?First visit review ?I reviewed new OB appt with pt. I explained she will have a pelvic exam, ob bloodwork with genetic screening, and PAP smear. Explained pt will be seen by Dr. Crissie Reese at first visit; encounter routed to appropriate provider. Explained that patient will be seen by pregnancy navigator following visit with  provider. [facility] information placed in AVS.  ? [name] ?04/07/2021  1:07 PM  ?

## 2021-04-07 NOTE — Patient Instructions (Addendum)
?  At our [facility], we work as an integrated team, providing care to address both physical and emotional health. Your medical provider may refer you to see our Behavioral Health Clinician (BHC) on the same day you see your medical provider, as availability permits; often scheduled virtually at your convenience.  ?Our BHC is available to all patients, visits generally last between 20-30 minutes, but can be longer or shorter, depending on patient need. The BHC offers help with stress management, coping with symptoms of depression and anxiety, major life changes , sleep issues, changing risky behavior, grief and loss, life stress, working on personal life goals, and  behavioral health issues, as these all affect your overall health and wellness.  ?The BHC is NOT available for the following: FMLA paperwork, court-ordered evaluations, specialty assessments (custody or disability), letters to employers, or obtaining certification for an emotional support animal. The BHC does not provide long-term therapy. ?You have the right to refuse integrated behavioral health services, or to reschedule to see the BHC at a later date.  ?Confidentiality exception: If it is suspected that a child or disabled adult is being abused or neglected, we are required by law to report that to either Child Protective Services or Adult Protective Services.  ?If you have a diagnosis of Bipolar affective disorder, Schizophrenia, or recurrent Major depressive disorder, we will recommend that you establish care with a psychiatrist, as these are lifelong, chronic conditions, and we want your overall emotional health and medications to be more closely monitored. If you anticipate needing extended maternity leave due to mental health issues postpartum, it it recommended you inform your medical provider, so we can put in a referral to a psychiatrist as soon as possible. The BHC is unable to recommend an extended maternity leave for mental  health issues. ?Your medical provider or BHC may refer you to a therapist for ongoing, traditional therapy, or to a psychiatrist, for medication management, if it would benefit your overall health. ?Depending on your insurance, you may have a copay or be charged a deductible, depending on your insurance, to see the BHC. If you are uninsured, it is recommended that you apply for financial assistance. (Forms may be requested at the front desk for in-person visits, via MyChart, or request a form during a virtual visit).  ?If you see the BHC more than 6 times, you will have to complete a comprehensive clinical assessment interview with the BHC to resume integrated services.  ?For virtual visits with the BHC, you must be physically in the state of [place] at the time of the visit. For example, if you live in [place], you will have to do an in-person visit with the BHC, and your out-of-state insurance may not cover behavioral health services in [place]. If you are going out of the state or country for any reason, the BHC may see you virtually when you return to [place], but not while you are physically outside of [place].  ?  ?Here is a link to the Pregnancy Navigators . Please ?Fill out prior to your New OB appointment.  ? ?English Link: [email or web address] ? ?Spanish Link: [email or web address].[email or web address]?[email or web address]  ?[email or web address] is a website with information to help you prepare for Labor and delivery, patient information, visitor information and more.   ?

## 2021-04-08 NOTE — BH Specialist Note (Unsigned)
Integrated Behavioral Health via Telemedicine Visit  04/08/2021 Tosha Cannaday [id number]  Number of [place] Clinician visits: No data recorded Session Start time: No data recorded  Session End time: No data recorded Total time in minutes: No data recorded  Referring Provider: *** Patient/Family location: *** [facility] Provider location: *** All persons participating in visit: *** Types of Service: {CHL AMB TYPE OF SERVICE:[phone]}  I connected with Lovell Sheehan and/or Cyril Mourning Odem's {family members:20773} via  Telephone or Geologist, engineering  (Video is Tree surgeon) and verified that I am speaking with the correct person using two identifiers. Discussed confidentiality: {YES/NO:21197}  I discussed the limitations of telemedicine and the availability of in person appointments.  Discussed there is a possibility of technology failure and discussed alternative modes of communication if that failure occurs.  I discussed that engaging in this telemedicine visit, they consent to the provision of behavioral healthcare and the services will be billed under their insurance.  Patient and/or legal guardian expressed understanding and consented to Telemedicine visit: {YES/NO:21197}  Presenting Concerns: Patient and/or family reports the following symptoms/concerns: *** Duration of problem: ***; Severity of problem: {Mild/Moderate/Severe:20260}  Patient and/or Family's Strengths/Protective Factors: {CHL AMB BH PROTECTIVE FACTORS:[phone]}  Goals Addressed: Patient will:  Reduce symptoms of: {IBH Symptoms:21014056}   Increase knowledge and/or ability of: {IBH Patient Tools:21014057}   Demonstrate ability to: {IBH Goals:21014053}  Progress towards Goals: {CHL AMB BH PROGRESS TOWARDS GOALS:[phone]}  Interventions: Interventions utilized:  {IBH Interventions:21014054} Standardized Assessments completed: {IBH Screening  Tools:21014051}  Patient and/or Family Response: ***  Assessment: Patient currently experiencing ***.   Patient may benefit from ***.  Plan: Follow up with behavioral health clinician on : *** Behavioral recommendations: *** Referral(s): {IBH Referrals:21014055}  I discussed the assessment and treatment plan with the patient and/or parent/guardian. They were provided an opportunity to ask questions and all were answered. They agreed with the plan and demonstrated an understanding of the instructions.   They were advised to call back or seek an in-person evaluation if the symptoms worsen or if the condition fails to improve as anticipated.  Caroleen Hamman Malaney Mcbean, LCSW

## 2021-04-09 ENCOUNTER — Inpatient Hospital Stay (HOSPITAL_COMMUNITY)
Admission: AD | Admit: 2021-04-09 | Discharge: 2021-04-10 | Disposition: A | Discharge status: Home / Self Care | Payer: 59 | Attending: Obstetrics and Gynecology | Admitting: Obstetrics and Gynecology

## 2021-04-09 ENCOUNTER — Other Ambulatory Visit: Payer: Self-pay

## 2021-04-09 DIAGNOSIS — R519 Headache, unspecified: Secondary | ICD-10-CM | POA: Diagnosis not present

## 2021-04-09 DIAGNOSIS — O26891 Other specified pregnancy related conditions, first trimester: Secondary | ICD-10-CM | POA: Insufficient documentation

## 2021-04-09 DIAGNOSIS — Z3A09 9 weeks gestation of pregnancy: Secondary | ICD-10-CM | POA: Insufficient documentation

## 2021-04-09 DIAGNOSIS — O99281 Endocrine, nutritional and metabolic diseases complicating pregnancy, first trimester: Secondary | ICD-10-CM | POA: Diagnosis not present

## 2021-04-09 DIAGNOSIS — O219 Vomiting of pregnancy, unspecified: Secondary | ICD-10-CM | POA: Diagnosis not present

## 2021-04-09 DIAGNOSIS — Z8639 Personal history of other endocrine, nutritional and metabolic disease: Secondary | ICD-10-CM

## 2021-04-09 DIAGNOSIS — E876 Hypokalemia: Secondary | ICD-10-CM | POA: Insufficient documentation

## 2021-04-09 DIAGNOSIS — Z79899 Other long term (current) drug therapy: Secondary | ICD-10-CM | POA: Insufficient documentation

## 2021-04-09 LAB — URINALYSIS, ROUTINE W REFLEX MICROSCOPIC
Bilirubin Urine: NEGATIVE
Glucose, UA: NEGATIVE mg/dL
Hgb urine dipstick: NEGATIVE
Ketones, ur: 5 mg/dL — AB
Leukocytes,Ua: NEGATIVE
Nitrite: NEGATIVE
Protein, ur: NEGATIVE mg/dL
Specific Gravity, Urine: 1.002 — ABNORMAL LOW (ref 1.005–1.030)
pH: 8 (ref 5.0–8.0)

## 2021-04-09 MED ORDER — LACTATED RINGERS IV BOLUS
1000.0000 mL | Freq: Once | INTRAVENOUS | Status: AC
  Administered 2021-04-10: 1000 mL via INTRAVENOUS

## 2021-04-09 MED ORDER — SODIUM CHLORIDE 0.9 % IV SOLN
25.0000 mg | Freq: Once | INTRAVENOUS | Status: AC
  Administered 2021-04-10: 25 mg via INTRAVENOUS
  Filled 2021-04-09: qty 1

## 2021-04-09 NOTE — MAU Provider Note (Signed)
?History  ?  ? ?CSN: [id number] ? ?Arrival date and time: 04/09/21 2137 ? ? Event Date/Time  ? First Provider Initiated Contact with Patient 04/09/21 2320   ?  ? ?Chief Complaint  ?Patient presents with  ? Nausea  ? Emesis  ? ?Carrie [name] is a 31 y.o. [phone] at [redacted]w[redacted]d.  She presents  today for Nausea and Emesis.  She states she has been "not feeling good" and thinks she she is dehydrated.  She states she has not eaten in 3 weeks and has been able to drink water, but experiences vomiting soon after.  She reports she had 4 incidents of vomiting today, despite taking Zofran and having scopolamine patch in place.  She reports she is also prescribed Phenergan, but has ran out of the prescription.  She reports headache, dizziness, and lightheadedness but denies pain.  No vaginal concerns, issues with urination, constipation or diarrhea. ? ? ?OB History   ? ? Gravida  ?4  ? Para  ?2  ? Term  ?2  ? Preterm  ?   ? AB  ?1  ? Living  ?2  ?  ? ? SAB  ?   ? IAB  ?1  ? Ectopic  ?   ? Multiple  ?0  ? Live Births  ?2  ?   ?  ?  ? ? ?Past Medical History:  ?Diagnosis Date  ? Asthma   ? Bipolar 2 disorder (HCC)   ? Low blood potassium   ? Psychiatric disorder   ? Vitamin D deficiency disease   ? ? ?Past Surgical History:  ?Procedure Laterality Date  ? TONSILLECTOMY    ? ? ?Family History  ?Problem Relation Age of Onset  ? ADD / ADHD Mother   ?     suspected  ? Depression Sister   ? ADD / ADHD Brother   ? Seizures Son   ? Mental illness Paternal Uncle   ?     schizophrenia, bipolar, ADHD  ? Cancer Neg Hx   ? Diabetes Neg Hx   ? Heart disease Neg Hx   ? Hypertension Neg Hx   ? Stroke Neg Hx   ? COPD Neg Hx   ? ? ?Social History  ? ?Tobacco Use  ? Smoking status: Never  ? Smokeless tobacco: Never  ?Vaping Use  ? Vaping Use: Never used  ?Substance Use Topics  ? Alcohol use: No  ? Drug use: Not Currently  ?  Types: Marijuana  ? ? ?Allergies: No Known Allergies ? ?Medications Prior to Admission  ?Medication Sig Dispense Refill Last Dose   ? albuterol (VENTOLIN HFA) 108 (90 Base) MCG/ACT inhaler Inhale 1 puff into the lungs every 6 (six) hours as needed for wheezing or shortness of breath. 18 g 5   ? budesonide-formoterol (SYMBICORT) 160-4.5 MCG/ACT inhaler Inhale 2 puffs into the lungs 2 (two) times daily. 1 Inhaler 2   ? ondansetron (ZOFRAN-ODT) 4 MG disintegrating tablet Take 1 tablet (4 mg total) by mouth every 6 (six) hours as needed for nausea. (Patient not taking: Reported on 04/07/2021) 20 tablet 0   ? pantoprazole (PROTONIX) 20 MG tablet Take 1 tablet (20 mg total) by mouth daily. (Patient not taking: Reported on 04/07/2021) 30 tablet 0   ? potassium chloride SA (KLOR-CON M) 20 MEQ tablet Take 2 tablets (40 mEq total) by mouth 2 (two) times daily. (Patient not taking: Reported on 04/07/2021) 12 tablet 0   ? Prenatal Vit-Fe Fumarate-FA (PRETAB) 29-1 MG  TABS Take 1 tablet by mouth daily. 90 tablet 3   ? promethazine (PHENERGAN) 25 MG suppository Place 1 suppository (25 mg total) rectally every 6 (six) hours as needed for nausea or vomiting. 12 each 0   ? promethazine (PHENERGAN) 25 MG tablet Take 0.5-1 tablets (12.5-25 mg total) by mouth every 6 (six) hours as needed for nausea or vomiting. (Patient not taking: Reported on 04/07/2021) 30 tablet 0   ? scopolamine (TRANSDERM-SCOP) 1 MG/3DAYS Place 1 patch (1.5 mg total) onto the skin every 3 (three) days. 10 patch 12   ? ? ?Review of Systems  ?Gastrointestinal:  Positive for nausea and vomiting. Negative for abdominal pain, constipation and diarrhea.  ?Genitourinary:  Negative for difficulty urinating, dysuria, vaginal bleeding and vaginal discharge.  ?Musculoskeletal:  Negative for back pain.  ?Neurological:  Positive for dizziness, light-headedness and headaches.  ?Physical Exam  ? ?Blood pressure 125/79, pulse 85, resp. rate 18, height 5\' 1"  (1.549 m), weight 77 kg, last menstrual period 02/04/2021, SpO2 99 %. ? ?Physical Exam ?Vitals reviewed.  ?Constitutional:   ?   Appearance: Normal appearance.  She is not toxic-appearing.  ?HENT:  ?   Head: Normocephalic and atraumatic.  ?Eyes:  ?   Conjunctiva/sclera: Conjunctivae normal.  ?Cardiovascular:  ?   Rate and Rhythm: Normal rate.  ?Pulmonary:  ?   Effort: Pulmonary effort is normal. No respiratory distress.  ?Musculoskeletal:     ?   General: Normal range of motion.  ?   Cervical back: Normal range of motion.  ?Neurological:  ?   Mental Status: She is alert and oriented to person, place, and time.  ?Psychiatric:     ?   Mood and Affect: Mood normal.     ?   Thought Content: Thought content normal.  ? ? ?MAU Course  ?Procedures ?Results for orders placed or performed during the hospital encounter of 04/09/21 (from the past 24 hour(s))  ?Urinalysis, Routine w reflex microscopic     Status: Abnormal  ? Collection Time: 04/09/21 10:29 PM  ?Result Value Ref Range  ? Color, Urine YELLOW YELLOW  ? APPearance HAZY (A) CLEAR  ? Specific Gravity, Urine 1.002 (L) 1.005 - 1.030  ? pH 8.0 5.0 - 8.0  ? Glucose, UA NEGATIVE NEGATIVE mg/dL  ? Hgb urine dipstick NEGATIVE NEGATIVE  ? Bilirubin Urine NEGATIVE NEGATIVE  ? Ketones, ur 5 (A) NEGATIVE mg/dL  ? Protein, ur NEGATIVE NEGATIVE mg/dL  ? Nitrite NEGATIVE NEGATIVE  ? Leukocytes,Ua NEGATIVE NEGATIVE  ?Basic metabolic panel     Status: Abnormal  ? Collection Time: 04/09/21 11:29 PM  ?Result Value Ref Range  ? Sodium 132 (L) 135 - 145 mmol/L  ? Potassium 3.5 3.5 - 5.1 mmol/L  ? Chloride 96 (L) 98 - 111 mmol/L  ? CO2 25 22 - 32 mmol/L  ? Glucose, Bld 92 70 - 99 mg/dL  ? BUN <5 (L) 6 - 20 mg/dL  ? Creatinine, Ser 0.53 0.44 - 1.00 mg/dL  ? Calcium 8.9 8.9 - 10.3 mg/dL  ? GFR, Estimated >60 >60 mL/min  ? Anion gap 11 5 - 15  ? ? ?MDM ?Start IV ?LR Bolus x 2 ?Antiemetic ?Labs: BNP, UA ?Assessment and Plan  ?31 year old, 26  ?SIUP at 9.2 weeks ?Nausea/Vomiting ?Hypokalemia ? ?-Reviewed POC with patient. ?-Will start IV and give LR bolus. ?-Will also give Phenergan bolus. ?-Collect BMP for assessment of potassium  levels. ?-Monitor and reassess as appropriate. ? ?[id number] ?04/09/2021, 11:21  PM  ? ?Reassessment (1:23 AM) ?-Patient reports continued nausea. ?-Phenergan infusion with 500 mL remaining. ?-Will also give Zofran and reassess ? ?Reassessment (2:27 AM) ?-Nurse states patient reports improvement in symptoms. ?-Plan to discharge after completion of all fluids. ?-We will plan to send prescription for Phenergan. ?-Encouraged to call primary office or return to MAU if symptoms worsen or with the onset of new symptoms. ?-Discharged to home in stable condition. ? ? ?Cherre Robins MSN, CNM ?Advanced Practice Provider, [facility] ? ?

## 2021-04-09 NOTE — MAU Note (Signed)
..  Carrie [name] is a 31 y.o. at [redacted]w[redacted]d here in MAU reporting: N/V with 4 episodes of emesis today. Pt states she is unable to keep anything down, eating or drinking. Pt has taken her N/V medication today, Scope Patch, Zofran, Phenergan, and potassium. Pt denies VB, LOF, abnormal discharge. Pt states only time she feels comfortable is in the shower. Pt states last use of marijuana was month ago.  ? ?Pain score: 0/10 ?Vitals:  ? 04/09/21 2226  ?BP: (!) 134/100  ?Pulse: (!) 101  ?Resp: 18  ?SpO2: 99%  ?   ? ?Lab orders placed from triage:  UA ? ?

## 2021-04-10 DIAGNOSIS — O219 Vomiting of pregnancy, unspecified: Secondary | ICD-10-CM

## 2021-04-10 DIAGNOSIS — Z3A09 9 weeks gestation of pregnancy: Secondary | ICD-10-CM

## 2021-04-10 LAB — BASIC METABOLIC PANEL
Anion gap: 11 (ref 5–15)
BUN: <5 mg/dL — ABNORMAL LOW (ref 6–20)
CO2: 25 mmol/L (ref 22–32)
Calcium: 8.9 mg/dL (ref 8.9–10.3)
Chloride: 96 mmol/L — ABNORMAL LOW (ref 98–111)
Creatinine, Ser: 0.53 mg/dL (ref 0.44–1.00)
GFR, Estimated: >60 mL/min (ref >60)
Glucose, Bld: 92 mg/dL (ref 70–99)
Potassium: 3.5 mmol/L (ref 3.5–5.1)
Sodium: 132 mmol/L — ABNORMAL LOW (ref 135–145)

## 2021-04-10 MED ORDER — PROMETHAZINE HCL 25 MG RE SUPP: 25.0000 mg | Freq: Four times a day (QID) | RECTAL | 1 refills | Status: DC | PRN

## 2021-04-10 MED ORDER — ONDANSETRON HCL 4 MG/2ML IJ SOLN
4.0000 mg | Freq: Once | INTRAMUSCULAR | Status: AC
Start: 2021-04-10 — End: 2021-04-10
  Administered 2021-04-10: 4 mg via INTRAVENOUS
  Filled 2021-04-10: qty 2

## 2021-04-14 ENCOUNTER — Other Ambulatory Visit: Payer: Self-pay

## 2021-04-14 ENCOUNTER — Other Ambulatory Visit: Payer: Self-pay | Admitting: Family Medicine

## 2021-04-14 ENCOUNTER — Ambulatory Visit
Admission: RE | Admit: 2021-04-14 | Discharge: 2021-04-14 | Disposition: A | Discharge status: Home / Self Care | Payer: 59 | Source: Ambulatory Visit | Attending: Family Medicine | Admitting: Family Medicine

## 2021-04-14 DIAGNOSIS — O211 Hyperemesis gravidarum with metabolic disturbance: Secondary | ICD-10-CM

## 2021-04-14 DIAGNOSIS — F12188 Cannabis abuse with other cannabis-induced disorder: Secondary | ICD-10-CM | POA: Diagnosis not present

## 2021-04-15 ENCOUNTER — Other Ambulatory Visit (HOSPITAL_COMMUNITY)
Admission: RE | Admit: 2021-04-15 | Discharge: 2021-04-15 | Disposition: A | Discharge status: Home / Self Care | Payer: 59 | Source: Ambulatory Visit | Attending: Family Medicine | Admitting: Family Medicine

## 2021-04-15 ENCOUNTER — Ambulatory Visit (INDEPENDENT_AMBULATORY_CARE_PROVIDER_SITE_OTHER): Payer: 59 | Admitting: Family Medicine

## 2021-04-15 ENCOUNTER — Inpatient Hospital Stay (HOSPITAL_COMMUNITY)
Admission: AD | Admit: 2021-04-15 | Discharge: 2021-04-16 | Disposition: A | Discharge status: Home / Self Care | Payer: 59 | Attending: Obstetrics and Gynecology | Admitting: Obstetrics and Gynecology

## 2021-04-15 ENCOUNTER — Encounter: Payer: Self-pay | Admitting: Family Medicine

## 2021-04-15 ENCOUNTER — Other Ambulatory Visit: Payer: Self-pay

## 2021-04-15 ENCOUNTER — Encounter (HOSPITAL_COMMUNITY): Payer: Self-pay | Admitting: Obstetrics and Gynecology

## 2021-04-15 VITALS — BP 146/95 | HR 111 | Wt 171.4 lb

## 2021-04-15 DIAGNOSIS — Z3A10 10 weeks gestation of pregnancy: Secondary | ICD-10-CM | POA: Insufficient documentation

## 2021-04-15 DIAGNOSIS — R0602 Shortness of breath: Secondary | ICD-10-CM | POA: Diagnosis not present

## 2021-04-15 DIAGNOSIS — O0990 Supervision of high risk pregnancy, unspecified, unspecified trimester: Secondary | ICD-10-CM

## 2021-04-15 DIAGNOSIS — Z113 Encounter for screening for infections with a predominantly sexual mode of transmission: Secondary | ICD-10-CM

## 2021-04-15 DIAGNOSIS — F3181 Bipolar II disorder: Secondary | ICD-10-CM

## 2021-04-15 DIAGNOSIS — Z124 Encounter for screening for malignant neoplasm of cervix: Secondary | ICD-10-CM | POA: Insufficient documentation

## 2021-04-15 DIAGNOSIS — O10911 Unspecified pre-existing hypertension complicating pregnancy, first trimester: Secondary | ICD-10-CM | POA: Insufficient documentation

## 2021-04-15 DIAGNOSIS — O10919 Unspecified pre-existing hypertension complicating pregnancy, unspecified trimester: Secondary | ICD-10-CM

## 2021-04-15 DIAGNOSIS — O210 Mild hyperemesis gravidarum: Secondary | ICD-10-CM | POA: Diagnosis not present

## 2021-04-15 DIAGNOSIS — F12188 Cannabis abuse with other cannabis-induced disorder: Secondary | ICD-10-CM

## 2021-04-15 DIAGNOSIS — O26891 Other specified pregnancy related conditions, first trimester: Secondary | ICD-10-CM | POA: Insufficient documentation

## 2021-04-15 DIAGNOSIS — R000 Tachycardia, unspecified: Secondary | ICD-10-CM | POA: Diagnosis not present

## 2021-04-15 DIAGNOSIS — O219 Vomiting of pregnancy, unspecified: Secondary | ICD-10-CM

## 2021-04-15 DIAGNOSIS — Z1331 Encounter for screening for depression: Secondary | ICD-10-CM

## 2021-04-15 HISTORY — DX: Unspecified pre-existing hypertension complicating pregnancy, unspecified trimester: O10.919

## 2021-04-15 LAB — POCT URINALYSIS DIP (DEVICE)
Bilirubin Urine: NEGATIVE
Glucose, UA: NEGATIVE mg/dL
Hgb urine dipstick: NEGATIVE
Ketones, ur: 80 mg/dL — AB
Leukocytes,Ua: NEGATIVE
Nitrite: NEGATIVE
Protein, ur: NEGATIVE mg/dL
Specific Gravity, Urine: 1.02 (ref 1.005–1.030)
Urobilinogen, UA: 0.2 mg/dL (ref 0.0–1.0)
pH: 7 (ref 5.0–8.0)

## 2021-04-15 MED ORDER — ONDANSETRON 4 MG PO TBDP: 4.0000 mg | ORAL_TABLET | Freq: Three times a day (TID) | ORAL | 5 refills | Status: DC | PRN

## 2021-04-15 MED ORDER — NIFEDIPINE ER OSMOTIC RELEASE 30 MG PO TB24: 30.0000 mg | ORAL_TABLET | Freq: Every day | ORAL | 5 refills | Status: DC

## 2021-04-15 NOTE — MAU Note (Signed)
..  Carrie [name] is a 30 y.o. at [redacted]w[redacted]d here in MAU reporting: N/V, unable to keep anything down, since last night with multiple episodes of emesis. Pt reports SOB at rest and exertion since last night. She reports taking her Zofran around 2100, went to sleep, and woke up vomiting again. She states the Phenergan suppository is not working. Pt denies VB, LOF,cramping and abnormal discharge. Not taking any medication for [facility].  ? ?Onset of complaint: last night ?Pain score: 0/10 ?Vitals:  ? 04/15/21 2335  ?BP: (!) 143/105  ?Pulse: (!) 114  ?Resp: 14  ?Temp: 98.2 ?F (36.8 ?C)  ?SpO2: 98%  ?   ?FHT:RN attempted, unable to assess with doppler. MAU Provider notified for Bedside US ? ?Lab orders placed from triage:  UA ? ?

## 2021-04-15 NOTE — Progress Notes (Signed)
?  ? ?Subjective:  ? ?Carrie [name] is a 31 y.o. [id number] at [redacted]w[redacted]d by LMP, early ultrasound being seen today for her first obstetrical visit.  Her obstetrical history is significant for  HTN, bipolar . Patient does intend to breast feed. Pregnancy history fully reviewed. ? ?Patient reports nausea. ? ?HISTORY: ?OB History  ?Gravida Para Term Preterm AB Living  ?4 2 2  0 1 2  ?SAB IAB Ectopic Multiple Live Births  ?0 1 0 0 2  ?  ?# Outcome Date GA Lbr Len/2nd Weight Sex Delivery Anes PTL Lv  ?4 Current           ?3 Term 07/29/14 [redacted]w[redacted]d 05:00 / 00:11 7 lb 6.7 oz (3.365 kg) F Vag-Spont None  LIV  ?   Birth Comments: none  ?   Apgar1: 9  Apgar5: 9  ?2 IAB 02/07/11          ?1 Term 09/06/07 [redacted]w[redacted]d  7 lb 11 oz (3.487 kg) M Vag-Spont None N LIV  ?   Birth Comments: wnl  ?  ? ?Last pap smear: ?No results found for: DIAGPAP, HPV, HPVHIGH ?NILM in 2016, needs repeat ? ?Past Medical History:  ?Diagnosis Date  ? Asthma   ? Bipolar 2 disorder (HCC)   ? Low blood potassium   ? Psychiatric disorder   ? Vitamin D deficiency disease   ? ?Past Surgical History:  ?Procedure Laterality Date  ? TONSILLECTOMY    ? ?Family History  ?Problem Relation Age of Onset  ? ADD / ADHD Mother   ?     suspected  ? Depression Sister   ? ADD / ADHD Brother   ? Seizures Son   ? Mental illness Paternal Uncle   ?     schizophrenia, bipolar, ADHD  ? Cancer Neg Hx   ? Diabetes Neg Hx   ? Heart disease Neg Hx   ? Hypertension Neg Hx   ? Stroke Neg Hx   ? COPD Neg Hx   ? ?Social History  ? ?Tobacco Use  ? Smoking status: Never  ? Smokeless tobacco: Never  ?Vaping Use  ? Vaping Use: Never used  ?Substance Use Topics  ? Alcohol use: No  ? Drug use: Not Currently  ?  Types: Marijuana  ? ?No Known Allergies ?Current Outpatient Medications on File Prior to Visit  ?Medication Sig Dispense Refill  ? Prenatal Vit-Fe Fumarate-FA (PRETAB) 29-1 MG TABS Take 1 tablet by mouth daily. 90 tablet 3  ? promethazine (PHENERGAN) 25 MG suppository Place 1 suppository (25 mg  total) rectally every 6 (six) hours as needed for nausea or vomiting. 15 each 1  ? scopolamine (TRANSDERM-SCOP) 1 MG/3DAYS Place 1 patch (1.5 mg total) onto the skin every 3 (three) days. 10 patch 12  ? albuterol (VENTOLIN HFA) 108 (90 Base) MCG/ACT inhaler Inhale 1 puff into the lungs every 6 (six) hours as needed for wheezing or shortness of breath. (Patient not taking: Reported on 04/15/2021) 18 g 5  ? budesonide-formoterol (SYMBICORT) 160-4.5 MCG/ACT inhaler Inhale 2 puffs into the lungs 2 (two) times daily. (Patient not taking: Reported on 04/15/2021) 1 Inhaler 2  ? ondansetron (ZOFRAN-ODT) 4 MG disintegrating tablet Take 1 tablet (4 mg total) by mouth every 6 (six) hours as needed for nausea. (Patient not taking: Reported on 04/07/2021) 20 tablet 0  ? pantoprazole (PROTONIX) 20 MG tablet Take 1 tablet (20 mg total) by mouth daily. (Patient not taking: Reported on 04/07/2021) 30 tablet 0  ?  potassium chloride SA (KLOR-CON M) 20 MEQ tablet Take 2 tablets (40 mEq total) by mouth 2 (two) times daily. (Patient not taking: Reported on 04/07/2021) 12 tablet 0  ? ?No current facility-administered medications on file prior to visit.  ? ? ? ?Exam  ? ?Vitals:  ? 04/15/21 0850  ?BP: (!) 149/96  ?Pulse: (!) 110  ?Weight: 171 lb 6.4 oz (77.7 kg)  ? ?  ? ?Uterus:     ?Pelvic Exam: Perineum: no hemorrhoids, normal perineum  ? Vulva: normal external genitalia, no lesions  ? Vagina:  normal mucosa mild amount of white discharge but denies any symptoms  ? Cervix: no lesions and normal, pap smear done.   ?System: General: well-developed, well-nourished female in no acute distress  ? Skin: normal coloration and turgor, no rashes  ? Neurologic: oriented, normal, negative, normal mood  ? Extremities: normal strength, tone, and muscle mass, ROM of all joints is normal  ? HEENT PERRLA, extraocular movement intact and sclera clear, anicteric  ? Neck supple and no masses  ? Respiratory:  no respiratory distress  ? ? ?  ?Assessment:   ? ?Pregnancy: [id number] ?Patient Active Problem List  ? Diagnosis Date Noted  ? Low blood potassium 04/07/2021  ? Supervision of high risk pregnancy, antepartum 04/07/2021  ? Cannabis hyperemesis syndrome concurrent with and due to cannabis abuse (HCC) 04/01/2021  ? Anxiety 05/29/2019  ? ADHD 03/21/2016  ? Vitamin D deficiency disease   ? Bipolar 2 disorder (HCC)   ? Psychiatric disorder   ? Asthma 07/30/2012  ? Anemia 08/07/2011  ? ?  ?Plan:  ?1. Positive depression screening ?Accepts BH referral ?- Ambulatory referral to Integrated Behavioral Health ? ?2. Supervision of high risk pregnancy, antepartum ?FHR normal ?BP mild range, see below ?Initial labs drawn. ?Continue prenatal vitamins. ?Genetic Screening discussed, NIPS: ordered. ?Ultrasound discussed; fetal anatomic survey: ordered. ?Problem list reviewed and updated. ?The nature of [facility] with multiple MDs and other Advanced Practice Providers was explained to patient; also emphasized that residents, students are part of our team. ? ?3. Screening for cervical cancer ?Pap collected today ? ?4. Screening examination for sexually transmitted disease ? ? ?5. Bipolar 2 disorder (HCC) ?Reports diagnosis is correct, not on meds at present, does not recall what she was on in the past ?Accepts BH referral ? ?6. Cannabis hyperemesis syndrome concurrent with and due to cannabis abuse (HCC) ?Denies any recent use ? ?7. Chronic hypertension affecting pregnancy ?Mild range initial BP ?On recheck 146/95 ?Baseline labs today ?Start Nifedipine 30 mg XL ? ? ?Routine obstetric precautions reviewed. ?Return in 4 weeks (on 05/13/2021) for [facility], ob visit. ? ?  ? ?

## 2021-04-15 NOTE — Patient Instructions (Signed)
Second Trimester of Pregnancy °The second trimester of pregnancy is from week 13 through week 27. This is months 4 through 6 of pregnancy. The second trimester is often a time when you feel your best. Your body has adjusted to being pregnant, and you begin to feel better physically. °During the second trimester: °Morning sickness has lessened or stopped completely. °You may have more energy. °You may have an increase in appetite. °The second trimester is also a time when the unborn baby (fetus) is growing rapidly. At the end of the sixth month, the fetus may be up to 12 inches long and weigh about 1½ pounds. You will likely begin to feel the baby move (quickening) between 16 and 20 weeks of pregnancy. °Body changes during your second trimester °Your body continues to go through many changes during your second trimester. The changes vary and generally return to normal after the baby is born. °Physical changes °Your weight will continue to increase. You will notice your lower abdomen bulging out. °You may begin to get stretch marks on your hips, abdomen, and breasts. °Your breasts will continue to grow and to become tender. °Dark spots or blotches (chloasma or mask of pregnancy) may develop on your face. °A dark line from your belly button to the pubic area (linea nigra) may appear. °You may have changes in your hair. These can include thickening of your hair, rapid growth, and changes in texture. Some people also have hair loss during or after pregnancy, or hair that feels dry or thin. °Health changes °You may develop headaches. °You may have heartburn. °You may develop constipation. °You may develop hemorrhoids or swollen, bulging veins (varicose veins). °Your gums may bleed and may be sensitive to brushing and flossing. °You may urinate more often because the fetus is pressing on your bladder. °You may have back pain. This is caused by: °Weight gain. °Pregnancy hormones that are relaxing the joints in your  pelvis. °A shift in weight and the muscles that support your balance. °Follow these instructions at home: °Medicines °Follow your health care provider's instructions regarding medicine use. Specific medicines may be either safe or unsafe to take during pregnancy. Do not take any medicines unless approved by your health care provider. °Take a prenatal vitamin that contains at least 600 micrograms (mcg) of folic acid. °Eating and drinking °Eat a healthy diet that includes fresh fruits and vegetables, whole grains, good sources of protein such as meat, eggs, or tofu, and low-fat dairy products. °Avoid raw meat and unpasteurized juice, milk, and cheese. These carry germs that can harm you and your baby. °You may need to take these actions to prevent or treat constipation: °Drink enough fluid to keep your urine pale yellow. °Eat foods that are high in fiber, such as beans, whole grains, and fresh fruits and vegetables. °Limit foods that are high in fat and processed sugars, such as fried or sweet foods. °Activity °Exercise only as directed by your health care provider. Most people can continue their usual exercise routine during pregnancy. Try to exercise for 30 minutes at least 5 days a week. Stop exercising if you develop contractions in your uterus. °Stop exercising if you develop pain or cramping in the lower abdomen or lower back. °Avoid exercising if it is very hot or humid or if you are at a high altitude. °Avoid heavy lifting. °If you choose to, you may have sex unless your health care provider tells you not to. °Relieving pain and discomfort °Wear a supportive bra   to prevent discomfort from breast tenderness. °Take warm sitz baths to soothe any pain or discomfort caused by hemorrhoids. Use hemorrhoid cream if your health care provider approves. °Rest with your legs raised (elevated) if you have leg cramps or low back pain. °If you develop varicose veins: °Wear support hose as told by your health care  provider. °Elevate your feet for 15 minutes, 3-4 times a day. °Limit salt in your diet. °Safety °Wear your seat belt at all times when driving or riding in a car. °Talk with your health care provider if someone is verbally or physically abusive to you. °Lifestyle °Do not use hot tubs, steam rooms, or saunas. °Do not douche. Do not use tampons or scented sanitary pads. °Avoid cat litter boxes and soil used by cats. These carry germs that can cause birth defects in the baby and possibly loss of the fetus by miscarriage or stillbirth. °Do not use herbal remedies, alcohol, illegal drugs, or medicines that are not approved by your health care provider. Chemicals in these products can harm your baby. °Do not use any products that contain nicotine or tobacco, such as cigarettes, e-cigarettes, and chewing tobacco. If you need help quitting, ask your health care provider. °General instructions °During a routine prenatal visit, your health care provider will do a physical exam and other tests. He or she will also discuss your overall health. Keep all follow-up visits. This is important. °Ask your health care provider for a referral to a local prenatal education class. °Ask for help if you have counseling or nutritional needs during pregnancy. Your health care provider can offer advice or refer you to specialists for help with various needs. °Where to find more information °American Pregnancy Association: americanpregnancy.org °American College of Obstetricians [employer] Gynecologists: acog.org/en/Womens%20Health/Pregnancy °Office on Women's Health: womenshealth.gov/pregnancy °Contact a health care provider if you have: °A headache that does not go away when you take medicine. °Vision changes or you see spots in front of your eyes. °Mild pelvic cramps, pelvic pressure, or nagging pain in the abdominal area. °Persistent nausea, vomiting, or diarrhea. °A bad-smelling vaginal discharge or foul-smelling urine. °Pain when you  urinate. °Sudden or extreme swelling of your face, hands, ankles, feet, or legs. °A fever. °Get help right away if you: °Have fluid leaking from your vagina. °Have spotting or bleeding from your vagina. °Have severe abdominal cramping or pain. °Have difficulty breathing. °Have chest pain. °Have fainting spells. °Have not felt your baby move for the time period told by your health care provider. °Have new or increased pain, swelling, or redness in an arm or leg. °Summary °The second trimester of pregnancy is from week 13 through week 27 (months 4 through 6). °Do not use herbal remedies, alcohol, illegal drugs, or medicines that are not approved by your health care provider. Chemicals in these products can harm your baby. °Exercise only as directed by your health care provider. Most people can continue their usual exercise routine during pregnancy. °Keep all follow-up visits. This is important. °This information is not intended to replace advice given to you by your health care provider. Make sure you discuss any questions you have with your health care provider. °Document Revised: 07/02/2019 Document Reviewed: 05/08/2019 °Elsevier Patient Education © 2022 [employer] ° °Contraception Choices °Contraception, also called birth control, refers to methods or devices that prevent pregnancy. °Hormonal methods °Contraceptive implant °A contraceptive implant is a thin, plastic tube that contains a hormone that prevents pregnancy. It is different from an intrauterine device (IUD). It   is inserted into the upper part of the arm by a health care provider. Implants can be effective for up to 3 years. °Progestin-only injections °Progestin-only injections are injections of progestin, a synthetic form of the hormone progesterone. They are given every 3 months by a health care provider. °Birth control pills °Birth control pills are pills that contain hormones that prevent pregnancy. They must be taken once a day, preferably at the  same time each day. A prescription is needed to use this method of contraception. °Birth control patch °The birth control patch contains hormones that prevent pregnancy. It is placed on the skin and must be changed once a week for three weeks and removed on the fourth week. A prescription is needed to use this method of contraception. °Vaginal ring °A vaginal ring contains hormones that prevent pregnancy. It is placed in the vagina for three weeks and removed on the fourth week. After that, the process is repeated with a new ring. A prescription is needed to use this method of contraception. °Emergency contraceptive °Emergency contraceptives prevent pregnancy after unprotected sex. They come in pill form and can be taken up to 5 days after sex. They work best the sooner they are taken after having sex. Most emergency contraceptives are available without a prescription. This method should not be used as your only form of birth control. °Barrier methods °Female condom °A female condom is a thin sheath that is worn over the penis during sex. Condoms keep sperm from going inside a woman's body. They can be used with a sperm-killing substance (spermicide) to increase their effectiveness. They should be thrown away after one use. °Female condom °A female condom is a soft, loose-fitting sheath that is put into the vagina before sex. The condom keeps sperm from going inside a woman's body. They should be thrown away after one use. °Diaphragm °A diaphragm is a soft, dome-shaped barrier. It is inserted into the vagina before sex, along with a spermicide. The diaphragm blocks sperm from entering the uterus, and the spermicide kills sperm. A diaphragm should be left in the vagina for 6-8 hours after sex and removed within 24 hours. °A diaphragm is prescribed and fitted by a health care provider. A diaphragm should be replaced every 1-2 years, after giving birth, after gaining more than 15 lb (6.8 kg), and after pelvic  surgery. °Cervical cap °A cervical cap is a round, soft latex or plastic cup that fits over the cervix. It is inserted into the vagina before sex, along with spermicide. It blocks sperm from entering the uterus. The cap should be left in place for 6-8 hours after sex and removed within 48 hours. A cervical cap must be prescribed and fitted by a health care provider. It should be replaced every 2 years. °Sponge °A sponge is a soft, circular piece of polyurethane foam with spermicide in it. The sponge helps block sperm from entering the uterus, and the spermicide kills sperm. To use it, you make it wet and then insert it into the vagina. It should be inserted before sex, left in for at least 6 hours after sex, and removed and thrown away within 30 hours. °Spermicides °Spermicides are chemicals that kill or block sperm from entering the cervix and uterus. They can come as a cream, jelly, suppository, foam, or tablet. A spermicide should be inserted into the vagina with an applicator at least 10-15 minutes before sex to allow time for it to work. The process must be repeated every time   you have sex. Spermicides do not require a prescription. °Intrauterine contraception °Intrauterine device (IUD) °An IUD is a T-shaped device that is put in a woman's uterus. There are two types: °Hormone IUD.This type contains progestin, a synthetic form of the hormone progesterone. This type can stay in place for 3-5 years. °Copper IUD.This type is wrapped in copper wire. It can stay in place for 10 years. °Permanent methods of contraception °Female tubal ligation °In this method, a woman's fallopian tubes are sealed, tied, or blocked during surgery to prevent eggs from traveling to the uterus. °Hysteroscopic sterilization °In this method, a small, flexible insert is placed into each fallopian tube. The inserts cause scar tissue to form in the fallopian tubes and block them, so sperm cannot reach an egg. The procedure takes about 3  months to be effective. Another form of birth control must be used during those 3 months. °Female sterilization °This is a procedure to tie off the tubes that carry sperm (vasectomy). After the procedure, the man can still ejaculate fluid (semen). Another form of birth control must be used for 3 months after the procedure. °Natural planning methods °Natural family planning °In this method, a couple does not have sex on days when the woman could become pregnant. °Calendar method °In this method, the woman keeps track of the length of each menstrual cycle, identifies the days when pregnancy can happen, and does not have sex on those days. °Ovulation method °In this method, a couple avoids sex during ovulation. °Symptothermal method °This method involves not having sex during ovulation. The woman typically checks for ovulation by watching changes in her temperature and in the consistency of cervical mucus. °Post-ovulation method °In this method, a couple waits to have sex until after ovulation. °Where to find more information °Centers for Disease Control and Prevention: www.cdc.gov °Summary °Contraception, also called birth control, refers to methods or devices that prevent pregnancy. °Hormonal methods of contraception include implants, injections, pills, patches, vaginal rings, and emergency contraceptives. °Barrier methods of contraception can include female condoms, female condoms, diaphragms, cervical caps, sponges, and spermicides. °There are two types of IUDs (intrauterine devices). An IUD can be put in a woman's uterus to prevent pregnancy for 3-5 years. °Permanent sterilization can be done through a procedure for males and females. Natural family planning methods involve nothaving sex on days when the woman could become pregnant. °This information is not intended to replace advice given to you by your health care provider. Make sure you discuss any questions you have with your health care provider. °Document  Revised: 06/30/2019 Document Reviewed: 06/30/2019 °Elsevier Patient Education © 2022 [employer] ° °

## 2021-04-16 DIAGNOSIS — O210 Mild hyperemesis gravidarum: Secondary | ICD-10-CM | POA: Diagnosis not present

## 2021-04-16 DIAGNOSIS — O26891 Other specified pregnancy related conditions, first trimester: Secondary | ICD-10-CM | POA: Diagnosis not present

## 2021-04-16 DIAGNOSIS — O10911 Unspecified pre-existing hypertension complicating pregnancy, first trimester: Secondary | ICD-10-CM | POA: Diagnosis not present

## 2021-04-16 DIAGNOSIS — R000 Tachycardia, unspecified: Secondary | ICD-10-CM | POA: Diagnosis not present

## 2021-04-16 LAB — CBC/D/PLT+RPR+RH+ABO+RUBIGG...
Antibody Screen: NEGATIVE
Basophils Absolute: 0 10*3/uL (ref 0.0–0.2)
Basos: 0 %
EOS (ABSOLUTE): 0 10*3/uL (ref 0.0–0.4)
Eos: 0 %
HCV Ab: NONREACTIVE
HIV Screen 4th Generation wRfx: NONREACTIVE
Hematocrit: 34.3 % (ref 34.0–46.6)
Hemoglobin: 11.2 g/dL (ref 11.1–15.9)
Hepatitis B Surface Ag: NEGATIVE
Immature Grans (Abs): 0 10*3/uL (ref 0.0–0.1)
Immature Granulocytes: 0 %
Lymphocytes Absolute: 1.3 10*3/uL (ref 0.7–3.1)
Lymphs: 17 %
MCH: 26.6 pg (ref 26.6–33.0)
MCHC: 32.7 g/dL (ref 31.5–35.7)
MCV: 82 fL (ref 79–97)
Monocytes Absolute: 0.5 10*3/uL (ref 0.1–0.9)
Monocytes: 7 %
Neutrophils Absolute: 5.8 10*3/uL (ref 1.4–7.0)
Neutrophils: 76 %
Platelets: 374 10*3/uL (ref 150–450)
RBC: 4.21 x10E6/uL (ref 3.77–5.28)
RDW: 17 % — ABNORMAL HIGH (ref 11.7–15.4)
RPR Ser Ql: NONREACTIVE
Rh Factor: POSITIVE
Rubella Antibodies, IGG: 1.5 index (ref >0.99)
WBC: 7.6 10*3/uL (ref 3.4–10.8)

## 2021-04-16 LAB — URINALYSIS, ROUTINE W REFLEX MICROSCOPIC
Bilirubin Urine: NEGATIVE
Glucose, UA: NEGATIVE mg/dL
Hgb urine dipstick: NEGATIVE
Ketones, ur: 80 mg/dL — AB
Leukocytes,Ua: NEGATIVE
Nitrite: NEGATIVE
Protein, ur: 30 mg/dL — AB
Specific Gravity, Urine: 1.021 (ref 1.005–1.030)
pH: 8 (ref 5.0–8.0)

## 2021-04-16 LAB — COMPREHENSIVE METABOLIC PANEL
ALT: 24 IU/L (ref 0–32)
ALT: 27 U/L (ref 0–44)
AST: 11 IU/L (ref 0–40)
AST: 13 U/L — ABNORMAL LOW (ref 15–41)
Albumin/Globulin Ratio: 1.5 (ref 1.2–2.2)
Albumin: 4.3 g/dL (ref 3.9–5.0)
Albumin: 3.2 g/dL — ABNORMAL LOW (ref 3.5–5.0)
Alkaline Phosphatase: 109 IU/L (ref 44–121)
Alkaline Phosphatase: 80 U/L (ref 38–126)
Anion gap: 11 (ref 5–15)
BUN/Creatinine Ratio: 11 (ref 9–23)
BUN: 5 mg/dL — ABNORMAL LOW (ref 6–20)
BUN: 5 mg/dL — ABNORMAL LOW (ref 6–20)
Bilirubin Total: 0.4 mg/dL (ref 0.0–1.2)
CO2: 22 mmol/L (ref 20–29)
CO2: 24 mmol/L (ref 22–32)
Calcium: 9.5 mg/dL (ref 8.7–10.2)
Calcium: 9.3 mg/dL (ref 8.9–10.3)
Chloride: 98 mmol/L (ref 96–106)
Chloride: 98 mmol/L (ref 98–111)
Creatinine, Ser: 0.47 mg/dL — ABNORMAL LOW (ref 0.57–1.00)
Creatinine, Ser: 0.58 mg/dL (ref 0.44–1.00)
GFR, Estimated: >60 mL/min (ref >60)
Globulin, Total: 2.9 g/dL (ref 1.5–4.5)
Glucose, Bld: 116 mg/dL — ABNORMAL HIGH (ref 70–99)
Glucose: 97 mg/dL (ref 70–99)
Potassium: 4 mmol/L (ref 3.5–5.2)
Potassium: 3.8 mmol/L (ref 3.5–5.1)
Sodium: 136 mmol/L (ref 134–144)
Sodium: 133 mmol/L — ABNORMAL LOW (ref 135–145)
Total Bilirubin: 0.6 mg/dL (ref 0.3–1.2)
Total Protein: 7.2 g/dL (ref 6.0–8.5)
Total Protein: 7 g/dL (ref 6.5–8.1)
eGFR: 131 mL/min/{1.73_m2} (ref >59)

## 2021-04-16 LAB — CBC
HCT: 32.3 % — ABNORMAL LOW (ref 36.0–46.0)
Hemoglobin: 10.9 g/dL — ABNORMAL LOW (ref 12.0–15.0)
MCH: 27.1 pg (ref 26.0–34.0)
MCHC: 33.7 g/dL (ref 30.0–36.0)
MCV: 80.3 fL (ref 80.0–100.0)
Platelets: 377 10*3/uL (ref 150–400)
RBC: 4.02 MIL/uL (ref 3.87–5.11)
RDW: 16.5 % — ABNORMAL HIGH (ref 11.5–15.5)
WBC: 7.3 10*3/uL (ref 4.0–10.5)
nRBC: 0 % (ref 0.0–0.2)

## 2021-04-16 LAB — HEMOGLOBIN A1C
Est. average glucose Bld gHb Est-mCnc: 108 mg/dL
Hgb A1c MFr Bld: 5.4 % (ref 4.8–5.6)

## 2021-04-16 LAB — PROTEIN / CREATININE RATIO, URINE
Creatinine, Urine: 98.5 mg/dL
Protein, Ur: 16.3 mg/dL
Protein/Creat Ratio: 165 mg/g creat (ref 0–200)

## 2021-04-16 LAB — HCV INTERPRETATION

## 2021-04-16 MED ORDER — PANTOPRAZOLE SODIUM 40 MG IV SOLR
40.0000 mg | Freq: Once | INTRAVENOUS | Status: AC
  Administered 2021-04-16: 40 mg via INTRAVENOUS
  Filled 2021-04-16: qty 10

## 2021-04-16 MED ORDER — PROMETHAZINE HCL 25 MG/ML IJ SOLN
25.0000 mg | Freq: Once | INTRAVENOUS | Status: AC
  Administered 2021-04-16: 25 mg via INTRAVENOUS
  Filled 2021-04-16 (×2): qty 1

## 2021-04-16 MED ORDER — ONDANSETRON 4 MG PO TBDP: 8.0000 mg | ORAL_TABLET | Freq: Three times a day (TID) | ORAL | 5 refills | Status: DC | PRN

## 2021-04-16 MED ORDER — NIFEDIPINE ER OSMOTIC RELEASE 30 MG PO TB24
30.0000 mg | ORAL_TABLET | Freq: Once | ORAL | Status: AC
  Administered 2021-04-16: 30 mg via ORAL
  Filled 2021-04-16: qty 1

## 2021-04-16 MED ORDER — PROMETHAZINE HCL 25 MG PO TABS: 25.0000 mg | ORAL_TABLET | Freq: Four times a day (QID) | ORAL | 2 refills | Status: DC | PRN

## 2021-04-16 MED ORDER — SODIUM CHLORIDE 0.9 % IV SOLN
8.0000 mg | Freq: Once | INTRAVENOUS | Status: AC
  Administered 2021-04-16: 8 mg via INTRAVENOUS
  Filled 2021-04-16: qty 4

## 2021-04-16 NOTE — MAU Provider Note (Addendum)
Chief Complaint: Nausea, Shortness of Breath, and Emesis   Event Date/Time   First Provider Initiated Contact with Patient 04/16/21 0031     SUBJECTIVE HPI: Carrie [name] is a 31 y.o. [id number] at [redacted]w[redacted]d who presents to Maternity Admissions reporting exacerbation of N/V. Hasn't been able to keep anything down for ~24 hours. Has had hyperemesis this pregnancy. 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 Pt denies recent use. Has used Phenergan, Zofran, Scopolamine patch, Protonix. Last dose Zofran 4 mg 04/15/21 @2100 . Woke up vomiting again. Doesn't think Phenergan suppositories work well. Hasn't been able to take Procardia or CHTN.   Also C/O mild SOB prior to arrival. Hx Hypokalemia this pregnancy. Known CHTN. Rx'd Procardia 30 XL. Not taking.   Associated signs and symptoms: Pos for feeling fast heartbeat x ~24 hours. Neg palpitations, chest pain, cough, fever, chills, diarrhea, loss of appetite, chest pain, cough, abd pain, VB, sick contacts  Past Medical History:  Diagnosis Date   Asthma    Bipolar 2 disorder ([place])    Low blood potassium    Psychiatric disorder    Vitamin D deficiency disease    OB History  Gravida Para Term Preterm AB Living  4 2 2   1 2   SAB IAB Ectopic Multiple Live Births    1   0 2    # Outcome Date GA Lbr Len/2nd Weight Sex Delivery Anes PTL Lv  4 Current           3 Term 07/29/14 [redacted]w[redacted]d 05:00 / 00:11 3365 g F Vag-Spont None  LIV     Birth Comments: none  2 IAB 02/07/11          1 Term 09/06/07 [redacted]w[redacted]d  3487 g M Vag-Spont None N LIV     Birth Comments: wnl   Past Surgical History:  Procedure Laterality Date   TONSILLECTOMY     Social History   Socioeconomic History   Marital status: Legally Separated    Spouse name: Not on file   Number of children: Not on file   Years of education: Not on file   Highest education level: Not on file  Occupational History   Not on file  Tobacco Use   Smoking status: Never   Smokeless tobacco: Never  Vaping Use   Vaping  Use: Never used  Substance and Sexual Activity   Alcohol use: No   Drug use: Not Currently    Types: Marijuana   Sexual activity: Not Currently    Birth control/protection: None  Other Topics Concern   Not on file  Social History Narrative   Not on file   Social Determinants of Health   Financial Resource Strain: Not on file  Food Insecurity: Food Insecurity Present   Worried About [place] in the Last Year: Sometimes true   Ran Out of Food in the Last Year: Sometimes true  Transportation Needs: No Transportation Needs   Lack of Transportation (Medical): No   Lack of Transportation (Non-Medical): No  Physical Activity: Not on file  Stress: Not on file  Social Connections: Not on file  Intimate Partner Violence: Not on file   Family History  Problem Relation Age of Onset   ADD / ADHD Mother        suspected   Depression Sister    ADD / ADHD Brother    Seizures Son    Mental illness Paternal Uncle        schizophrenia, bipolar, ADHD   Cancer Neg Hx  Diabetes Neg Hx    Heart disease Neg Hx    Hypertension Neg Hx    Stroke Neg Hx    COPD Neg Hx    No current facility-administered medications on file prior to encounter.   Current Outpatient Medications on File Prior to Encounter  Medication Sig Dispense Refill   NIFEdipine (PROCARDIA-XL/NIFEDICAL-XL) 30 MG 24 hr tablet Take 1 tablet (30 mg total) by mouth daily. 30 tablet 5   Prenatal Vit-Fe Fumarate-FA (PRETAB) 29-1 MG TABS Take 1 tablet by mouth daily. 90 tablet 3   promethazine (PHENERGAN) 25 MG suppository Place 1 suppository (25 mg total) rectally every 6 (six) hours as needed for nausea or vomiting. 15 each 1   scopolamine (TRANSDERM-SCOP) 1 MG/3DAYS Place 1 patch (1.5 mg total) onto the skin every 3 (three) days. 10 patch 12   No Known Allergies  I have reviewed patient's Past Medical Hx, Surgical Hx, Family Hx, Social Hx, medications and allergies.   Review of Systems  Constitutional:  Negative  for appetite change, chills and fever.  Respiratory:  Positive for shortness of breath.   Gastrointestinal:  Positive for nausea and vomiting. Negative for abdominal pain, constipation and diarrhea.  Genitourinary:  Negative for vaginal bleeding.   OBJECTIVE Patient Vitals for the past 24 hrs:  BP Temp Temp src Pulse Resp SpO2 Height Weight  04/16/21 0529 (!) 135/100 -- -- (!) 112 -- -- -- --  04/16/21 0526 (!) 143/96 -- -- (!) 109 -- -- -- --  04/16/21 0356 125/83 -- -- (!) 108 -- -- -- --  04/15/21 2355 132/89 -- -- (!) 102 -- -- -- --  04/15/21 2335 (!) 143/105 98.2 F (36.8 C) Oral (!) 114 14 98 % 5\' 1"  (1.549 m) 77 kg   Constitutional: Well-developed, well-nourished female in mild distress.  Head: Mucus membranes moist Cardiovascular: mild tachycardia Respiratory: normal rate and effort.  GI: Abd soft, non-tender, gravid appropriate for gestational age.  MS: Extremities nontender, no edema, normal ROM Neurologic: Alert and oriented x 4.  GU: Deferred  FHR 169 by [place]  LAB RESULTS Results for orders placed or performed during the hospital encounter of 04/15/21 (from the past 24 hour(s))  Comprehensive metabolic panel     Status: Abnormal   Collection Time: 04/16/21  1:49 AM  Result Value Ref Range   Sodium 133 (L) 135 - 145 mmol/L   Potassium 3.8 3.5 - 5.1 mmol/L   Chloride 98 98 - 111 mmol/L   CO2 24 22 - 32 mmol/L   Glucose, Bld 116 (H) 70 - 99 mg/dL   BUN 5 (L) 6 - 20 mg/dL   Creatinine, Ser 0.58 0.44 - 1.00 mg/dL   Calcium 9.3 8.9 - 10.3 mg/dL   Total Protein 7.0 6.5 - 8.1 g/dL   Albumin 3.2 (L) 3.5 - 5.0 g/dL   AST 13 (L) 15 - 41 U/L   ALT 27 0 - 44 U/L   Alkaline Phosphatase 80 38 - 126 U/L   Total Bilirubin 0.6 0.3 - 1.2 mg/dL   GFR, Estimated >60 >60 mL/min   Anion gap 11 5 - 15  CBC     Status: Abnormal   Collection Time: 04/16/21  1:49 AM  Result Value Ref Range   WBC 7.3 4.0 - 10.5 K/uL   RBC 4.02 3.87 - 5.11 MIL/uL   Hemoglobin 10.9 (L) 12.0 - 15.0  g/dL   HCT 32.3 (L) 36.0 - 46.0 %   MCV 80.3 80.0 - 100.0  fL   MCH 27.1 26.0 - 34.0 pg   MCHC 33.7 30.0 - 36.0 g/dL   RDW 16.5 (H) 11.5 - 15.5 %   Platelets 377 150 - 400 K/uL   nRBC 0.0 0.0 - 0.2 %  Urinalysis, Routine w reflex microscopic     Status: Abnormal   Collection Time: 04/16/21  5:15 AM  Result Value Ref Range   Color, Urine AMBER (A) YELLOW   APPearance HAZY (A) CLEAR   Specific Gravity, Urine 1.021 1.005 - 1.030   pH 8.0 5.0 - 8.0   Glucose, UA NEGATIVE NEGATIVE mg/dL   Hgb urine dipstick NEGATIVE NEGATIVE   Bilirubin Urine NEGATIVE NEGATIVE   Ketones, ur 80 (A) NEGATIVE mg/dL   Protein, ur 30 (A) NEGATIVE mg/dL   Nitrite NEGATIVE NEGATIVE   Leukocytes,Ua NEGATIVE NEGATIVE   RBC / HPF 0-5 0 - 5 RBC/hpf   WBC, UA 0-5 0 - 5 WBC/hpf   Bacteria, UA RARE (A) NONE SEEN   Squamous Epithelial / LPF 0-5 0 - 5   Mucus PRESENT     IMAGING NA  EKG NSR 94 BPM  MAU COURSE Orders Placed This Encounter  Procedures   Urinalysis, Routine w reflex microscopic   Comprehensive metabolic panel   CBC   EKG 12-Lead   Insert peripheral IV   Discharge patient   Meds ordered this encounter  Medications   promethazine (PHENERGAN) 25 mg in lactated ringers 1,000 mL infusion   ondansetron (ZOFRAN) 8 mg in sodium chloride 0.9 % 50 mL IVPB   promethazine (PHENERGAN) 25 MG tablet    Sig: Take 1 tablet (25 mg total) by mouth every 6 (six) hours as needed for nausea or vomiting.    Dispense:  30 tablet    Refill:  2    Order Specific Question:   Supervising Provider    Answer:   Rip Harbour, MICHAEL L [[id number]]   ondansetron (ZOFRAN-ODT) 4 MG disintegrating tablet    Sig: Take 2 tablets (8 mg total) by mouth every 8 (eight) hours as needed for nausea or vomiting.    Dispense:  30 tablet    Refill:  5    Order Specific Question:   Supervising Provider    Answer:   Rip Harbour, MICHAEL L [[id number]]   NIFEdipine (PROCARDIA-XL/NIFEDICAL-XL) 24 hr tablet 30 mg   pantoprazole (PROTONIX) injection 40  mg   Nausea still present after Zofran and Phenergan. No vomiting. Will add Protonix.   Partial improvement. No vomiting. Feels OK to go home. Will Rx PO Phenergan and increase Zofran to 8 mg Q8-12 PRN and encourage pt to stay on schedule to see if that controls Sx better.    MDM - Exacerbation of Hyperemesis that pt feels is the same as what she had experienced so far this pregnancy. No new or additional Sx that suggest other etiology. No abd pain, diarrhea, leukocytosis or sick contact. Rx PO Phenergan and increase Zofran to 8 mg.   - Mild SOB. Nml EKG and Nml O2 Sats. Low suspicion for PE or cardiac emergency.  - Mild tachycardia. Present x 1 week. NSR on EKG. Possibly R/T dehydration, pregnancy.   - CHNT. Mild-range BP. Procardia dose given. Encouraged to continue as directed.    - Hx Hypokalemia. Nml today.    ASSESSMENT 1. Hyperemesis affecting pregnancy, antepartum   2. SOB (shortness of breath)   3. Sinus tachycardia   4. Nausea/vomiting in pregnancy   5. Chronic hypertension affecting pregnancy  PLAN Discharge home in stable condition. Hyperemesis precautions Rx PO Phenergan and increase Zofran to 8 mg.    Follow-up [facility] for [facility] Follow up.   Specialty: Obstetrics and Gynecology Why: As scheduled, Routine prenatal visit Contact information: [address] [place]  [phone] [facility] Follow up.   Specialty: Obstetrics and Gynecology Why: As needed in emergencies Contact information: [address] [id number] [place] [phone]               Allergies as of 04/16/2021   No Known Allergies      Medication List     TAKE these medications    NIFEdipine 30 MG 24 hr tablet Commonly known as: PROCARDIA-XL/NIFEDICAL-XL Take 1 tablet (30 mg total) by mouth daily.   ondansetron 4  MG disintegrating tablet Commonly known as: ZOFRAN-ODT Take 2 tablets (8 mg total) by mouth every 8 (eight) hours as needed for nausea or vomiting. What changed: how much to take   PreTAB 29-1 MG Tabs Take 1 tablet by mouth daily.   promethazine 25 MG suppository Commonly known as: PHENERGAN Place 1 suppository (25 mg total) rectally every 6 (six) hours as needed for nausea or vomiting. What changed: Another medication with the same name was added. Make sure you understand how and when to take each.   promethazine 25 MG tablet Commonly known as: PHENERGAN Take 1 tablet (25 mg total) by mouth every 6 (six) hours as needed for nausea or vomiting. What changed: You were already taking a medication with the same name, and this prescription was added. Make sure you understand how and when to take each.   scopolamine 1 MG/3DAYS Commonly known as: TRANSDERM-SCOP Place 1 patch (1.5 mg total) onto the skin every 3 (three) days.         Tamala Julian, [place], [place] 04/16/2021  6:29 AM

## 2021-04-17 LAB — CULTURE, OB URINE

## 2021-04-17 LAB — URINE CULTURE, OB REFLEX

## 2021-04-19 ENCOUNTER — Other Ambulatory Visit: Payer: 59

## 2021-04-19 LAB — CYTOLOGY - PAP
Chlamydia: NEGATIVE
Comment: NEGATIVE
Comment: NEGATIVE
Comment: NEGATIVE
Comment: NORMAL
Diagnosis: UNDETERMINED — AB
High risk HPV: NEGATIVE
Neisseria Gonorrhea: NEGATIVE
Trichomonas: NEGATIVE

## 2021-04-20 ENCOUNTER — Other Ambulatory Visit: Payer: Self-pay

## 2021-04-20 ENCOUNTER — Encounter: Payer: Self-pay | Admitting: Family Medicine

## 2021-04-20 ENCOUNTER — Ambulatory Visit (INDEPENDENT_AMBULATORY_CARE_PROVIDER_SITE_OTHER): Payer: 59 | Admitting: Clinical

## 2021-04-20 ENCOUNTER — Telehealth: Payer: Self-pay

## 2021-04-20 DIAGNOSIS — F3181 Bipolar II disorder: Secondary | ICD-10-CM | POA: Diagnosis not present

## 2021-04-20 DIAGNOSIS — F908 Attention-deficit hyperactivity disorder, other type: Secondary | ICD-10-CM

## 2021-04-20 DIAGNOSIS — O10919 Unspecified pre-existing hypertension complicating pregnancy, unspecified trimester: Secondary | ICD-10-CM

## 2021-04-20 DIAGNOSIS — Z658 Other specified problems related to psychosocial circumstances: Secondary | ICD-10-CM

## 2021-04-20 NOTE — Telephone Encounter (Signed)
-----   Message from Venora Maples, MD sent at 04/20/2021  1:11 PM EDT ----- ?Clinical pool: ?Gummies are fine. ?We can't put her on bedrest for hyperemesis, at least I never have. If she wants to take ADA or FMLA leave early that would probably be her only option. If she's still having this much trouble and especially if she's losing weight she should probably go to MAU.  ? ?----- Message ----- ?From: Rae Lips, LCSW ?Sent: 04/20/2021   9:48 AM EDT ?To: Venora Maples, MD ? ?Pt requesting following:  ? ?# prescription for gummy prenatal vitamins ?# Updated letter for her employer: work has put her on administrative leave as "note wasn't specific enough, needs to say whether or not restrictions are as needed or for remainder of pregnancy". Pt wants to know if she should just be put on bedrest, still much nausea and vomiting daily even while taking medication as prescribed. [employer] office is considering sending her to an office in [place], and she says the drive from [place] to [place] will make it worse with motion/car sickness, so requesting something in letter to specify limitations with driving long distances as well. She feels this would be dangerous to drive the highway while vomiting.  ? ? ?

## 2021-04-20 NOTE — Telephone Encounter (Addendum)
Called patient; VM left stating I am calling to follow up on concerns. Callback number given. Called pt a second time. Discussed with patient that we can send gummy prenatal vitamins. Explained that we do not recommend bedrest for her symptoms, but that we are happy to fill out any paperwork needed for employer if she chooses to pursue disability or FMLA. Reviewed availability of MAU at all times if patient is not able to eat or drink for extended period of time. ?

## 2021-04-20 NOTE — Patient Instructions (Signed)
[facility] [address] [place], [place] [address] [phone] (main office) [phone] (Kassity Woodson's office)   

## 2021-04-21 MED ORDER — PRENATAL ADULT GUMMY/DHA/FA 0.4-25 MG PO CHEW: 1.0000 | CHEWABLE_TABLET | Freq: Every day | ORAL | 11 refills | Status: AC

## 2021-04-22 ENCOUNTER — Encounter (HOSPITAL_COMMUNITY): Payer: Self-pay | Admitting: Obstetrics and Gynecology

## 2021-04-22 ENCOUNTER — Other Ambulatory Visit: Payer: Self-pay

## 2021-04-22 ENCOUNTER — Inpatient Hospital Stay (HOSPITAL_COMMUNITY)
Admission: AD | Admit: 2021-04-22 | Discharge: 2021-04-22 | Disposition: A | Discharge status: Home / Self Care | Payer: 59 | Attending: Obstetrics and Gynecology | Admitting: Obstetrics and Gynecology

## 2021-04-22 DIAGNOSIS — T2114XA Burn of first degree of lower back, initial encounter: Secondary | ICD-10-CM | POA: Diagnosis not present

## 2021-04-22 DIAGNOSIS — O26891 Other specified pregnancy related conditions, first trimester: Secondary | ICD-10-CM | POA: Insufficient documentation

## 2021-04-22 DIAGNOSIS — X110XXA Contact with hot water in bath or tub, initial encounter: Secondary | ICD-10-CM | POA: Diagnosis not present

## 2021-04-22 DIAGNOSIS — E860 Dehydration: Secondary | ICD-10-CM | POA: Insufficient documentation

## 2021-04-22 DIAGNOSIS — O210 Mild hyperemesis gravidarum: Secondary | ICD-10-CM | POA: Diagnosis not present

## 2021-04-22 DIAGNOSIS — Y92002 Bathroom of unspecified non-institutional (private) residence single-family (private) house as the place of occurrence of the external cause: Secondary | ICD-10-CM | POA: Insufficient documentation

## 2021-04-22 DIAGNOSIS — Y93E1 Activity, personal bathing and showering: Secondary | ICD-10-CM | POA: Insufficient documentation

## 2021-04-22 DIAGNOSIS — O218 Other vomiting complicating pregnancy: Secondary | ICD-10-CM | POA: Insufficient documentation

## 2021-04-22 DIAGNOSIS — Z3A11 11 weeks gestation of pregnancy: Secondary | ICD-10-CM | POA: Diagnosis not present

## 2021-04-22 HISTORY — DX: Essential (primary) hypertension: I10

## 2021-04-22 LAB — COMPREHENSIVE METABOLIC PANEL
ALT: 27 U/L (ref 0–44)
AST: 19 U/L (ref 15–41)
Albumin: 3.4 g/dL — ABNORMAL LOW (ref 3.5–5.0)
Alkaline Phosphatase: 85 U/L (ref 38–126)
Anion gap: 14 (ref 5–15)
BUN: <5 mg/dL — ABNORMAL LOW (ref 6–20)
CO2: 23 mmol/L (ref 22–32)
Calcium: 9.3 mg/dL (ref 8.9–10.3)
Chloride: 95 mmol/L — ABNORMAL LOW (ref 98–111)
Creatinine, Ser: 0.61 mg/dL (ref 0.44–1.00)
GFR, Estimated: >60 mL/min (ref >60)
Glucose, Bld: 90 mg/dL (ref 70–99)
Potassium: 3.6 mmol/L (ref 3.5–5.1)
Sodium: 132 mmol/L — ABNORMAL LOW (ref 135–145)
Total Bilirubin: 1.8 mg/dL — ABNORMAL HIGH (ref 0.3–1.2)
Total Protein: 7.2 g/dL (ref 6.5–8.1)

## 2021-04-22 LAB — URINALYSIS, ROUTINE W REFLEX MICROSCOPIC
Glucose, UA: NEGATIVE mg/dL
Hgb urine dipstick: NEGATIVE
Ketones, ur: 80 mg/dL — AB
Leukocytes,Ua: NEGATIVE
Nitrite: NEGATIVE
Protein, ur: 30 mg/dL — AB
Specific Gravity, Urine: 1.027 (ref 1.005–1.030)
pH: 6 (ref 5.0–8.0)

## 2021-04-22 LAB — CBC
HCT: 34.1 % — ABNORMAL LOW (ref 36.0–46.0)
Hemoglobin: 11.7 g/dL — ABNORMAL LOW (ref 12.0–15.0)
MCH: 27.4 pg (ref 26.0–34.0)
MCHC: 34.3 g/dL (ref 30.0–36.0)
MCV: 79.9 fL — ABNORMAL LOW (ref 80.0–100.0)
Platelets: 414 10*3/uL — ABNORMAL HIGH (ref 150–400)
RBC: 4.27 MIL/uL (ref 3.87–5.11)
RDW: 16 % — ABNORMAL HIGH (ref 11.5–15.5)
WBC: 5.8 10*3/uL (ref 4.0–10.5)
nRBC: 0 % (ref 0.0–0.2)

## 2021-04-22 LAB — RAPID URINE DRUG SCREEN, HOSP PERFORMED
Amphetamines: NOT DETECTED
Barbiturates: NOT DETECTED
Benzodiazepines: NOT DETECTED
Cocaine: NOT DETECTED
Opiates: NOT DETECTED
Tetrahydrocannabinol: POSITIVE — AB

## 2021-04-22 MED ORDER — FAMOTIDINE 20 MG PO TABS: 20.0000 mg | ORAL_TABLET | Freq: Every day | ORAL | 0 refills | Status: DC

## 2021-04-22 MED ORDER — PROCHLORPERAZINE MALEATE 10 MG PO TABS
10.0000 mg | ORAL_TABLET | Freq: Three times a day (TID) | ORAL | 0 refills | Status: DC | PRN
Start: 2021-04-22 — End: 2021-05-02

## 2021-04-22 MED ORDER — BACITRACIN ZINC 500 UNIT/GM EX OINT: 1.0000 "application " | TOPICAL_OINTMENT | Freq: Three times a day (TID) | CUTANEOUS | 0 refills | Status: DC

## 2021-04-22 MED ORDER — FAMOTIDINE IN NACL 20-0.9 MG/50ML-% IV SOLN
20.0000 mg | Freq: Once | INTRAVENOUS | Status: AC
  Administered 2021-04-22: 20 mg via INTRAVENOUS
  Filled 2021-04-22: qty 50

## 2021-04-22 MED ORDER — PROCHLORPERAZINE EDISYLATE 10 MG/2ML IJ SOLN
10.0000 mg | Freq: Once | INTRAMUSCULAR | Status: AC
  Administered 2021-04-22: 10 mg via INTRAVENOUS
  Filled 2021-04-22: qty 2

## 2021-04-22 MED ORDER — LACTATED RINGERS IV BOLUS
1000.0000 mL | Freq: Once | INTRAVENOUS | Status: AC
  Administered 2021-04-22: 1000 mL via INTRAVENOUS

## 2021-04-22 NOTE — MAU Provider Note (Signed)
?History  ?  ? ?CSN: [id number] ? ?Arrival date and time: 04/22/21 0932 ? ? Event Date/Time  ? First Provider Initiated Contact with Patient 04/22/21 1031   ?  ? ?Chief Complaint  ?Patient presents with  ? Abdominal Pain  ? Emesis  ? ?31 year old G4 P2-0-1-2 at 11.0 weeks presenting with recurrent nausea and vomiting.  This is her sixth MAU visit for the symptoms.  Reports intolerance of p.o. fluids or food.  She has tried Phenergan suppositories, Zofran and scopolamine and reports no improvement.  Denies sick contacts.  Denies diarrhea or fever.  Reports occasional abdominal pain.  Denies vaginal bleeding.  States she cannot stay out of the shower, hot showers improve her symptoms.  Reports a burn to her back due to this.  Has history of cannabinoid hyperemesis and reports last use was 2 months ago.  Total weight loss is 5 pounds. ? ? ?OB History   ? ? Gravida  ?4  ? Para  ?2  ? Term  ?2  ? Preterm  ?   ? AB  ?1  ? Living  ?2  ?  ? ? SAB  ?   ? IAB  ?1  ? Ectopic  ?   ? Multiple  ?0  ? Live Births  ?2  ?   ?  ?  ? ? ?Past Medical History:  ?Diagnosis Date  ? Asthma   ? Bipolar 2 disorder ([facility])   ? Hypertension   ? Low blood potassium   ? Psychiatric disorder   ? Vitamin D deficiency disease   ? ? ?Past Surgical History:  ?Procedure Laterality Date  ? TONSILLECTOMY    ? ? ?Family History  ?Problem Relation Age of Onset  ? ADD / ADHD Mother   ?     suspected  ? Healthy Father   ? Depression Sister   ? ADD / ADHD Brother   ? Seizures Son   ? Mental illness Paternal Uncle   ?     schizophrenia, bipolar, ADHD  ? Cancer Neg Hx   ? Diabetes Neg Hx   ? Heart disease Neg Hx   ? Hypertension Neg Hx   ? Stroke Neg Hx   ? COPD Neg Hx   ? ? ?Social History  ? ?Tobacco Use  ? Smoking status: Never  ? Smokeless tobacco: Never  ?Vaping Use  ? Vaping Use: Never used  ?Substance Use Topics  ? Alcohol use: No  ? Drug use: Not Currently  ?  Types: Marijuana  ?  Comment: month ago  ? ? ?Allergies: No Known Allergies ? ?Medications Prior  to Admission  ?Medication Sig Dispense Refill Last Dose  ? NIFEdipine (PROCARDIA-XL/NIFEDICAL-XL) 30 MG 24 hr tablet Take 1 tablet (30 mg total) by mouth daily. 30 tablet 5 04/22/2021  ? ondansetron (ZOFRAN-ODT) 4 MG disintegrating tablet Take 2 tablets (8 mg total) by mouth every 8 (eight) hours as needed for nausea or vomiting. 30 tablet 5 04/22/2021  ? promethazine (PHENERGAN) 25 MG suppository Place 1 suppository (25 mg total) rectally every 6 (six) hours as needed for nausea or vomiting. 15 each 1 04/22/2021  ? scopolamine (TRANSDERM-SCOP) 1 MG/3DAYS Place 1 patch (1.5 mg total) onto the skin every 3 (three) days. 10 patch 12 04/21/2021  ? Prenatal MV & Min w/FA-DHA (PRENATAL ADULT GUMMY/DHA/FA) 0.4-25 MG CHEW Chew 1 Dose by mouth daily. Daily dose indicated on package. 30 tablet 11   ? Prenatal Vit-Fe Fumarate-FA (PRETAB) 29-1 MG TABS  Take 1 tablet by mouth daily. 90 tablet 3   ? promethazine (PHENERGAN) 25 MG tablet Take 1 tablet (25 mg total) by mouth every 6 (six) hours as needed for nausea or vomiting. 30 tablet 2   ? ? ?Review of Systems  ?Constitutional:  Negative for chills and fever.  ?Gastrointestinal:  Positive for abdominal pain, constipation, nausea and vomiting.  ?Genitourinary:  Negative for vaginal bleeding.  ?Physical Exam  ? ?Blood pressure 118/71, pulse (!) 101, temperature 97.6 ?F (36.4 ?C), temperature source Oral, resp. rate 16, height 5\' 1"  (1.549 m), weight 75 kg, last menstrual period 02/04/2021, SpO2 99 %. ? ?Physical Exam ?Vitals and nursing note reviewed.  ?Constitutional:   ?   General: She is not in acute distress. ?   Appearance: Normal appearance.  ?HENT:  ?   Head: Normocephalic and atraumatic.  ?Cardiovascular:  ?   Rate and Rhythm: Normal rate.  ?Pulmonary:  ?   Effort: Pulmonary effort is normal. No respiratory distress.  ?Musculoskeletal:     ?   General: Normal range of motion.  ?   Cervical back: Normal range of motion.  ?Skin: ?   Comments: Burn present on back   ?Neurological:  ?   General: No focal deficit present.  ?   Mental Status: She is alert and oriented to person, place, and time.  ?Psychiatric:     ?   Mood and Affect: Mood normal.     ?   Behavior: Behavior normal.  ?  ?Media Information ?Document Information ? ? ?Results for orders placed or performed during the hospital encounter of 04/22/21 (from the past 24 hour(s))  ?Urinalysis, Routine w reflex microscopic Urine, Clean Catch     Status: Abnormal  ? Collection Time: 04/22/21 10:32 AM  ?Result Value Ref Range  ? Color, Urine AMBER (A) YELLOW  ? APPearance HAZY (A) CLEAR  ? Specific Gravity, Urine 1.027 1.005 - 1.030  ? pH 6.0 5.0 - 8.0  ? Glucose, UA NEGATIVE NEGATIVE mg/dL  ? Hgb urine dipstick NEGATIVE NEGATIVE  ? Bilirubin Urine SMALL (A) NEGATIVE  ? Ketones, ur 80 (A) NEGATIVE mg/dL  ? Protein, ur 30 (A) NEGATIVE mg/dL  ? Nitrite NEGATIVE NEGATIVE  ? Leukocytes,Ua NEGATIVE NEGATIVE  ? RBC / HPF 0-5 0 - 5 RBC/hpf  ? WBC, UA 11-20 0 - 5 WBC/hpf  ? Bacteria, UA MANY (A) NONE SEEN  ? Squamous Epithelial / LPF 11-20 0 - 5  ? Mucus PRESENT   ? Non Squamous Epithelial 0-5 (A) NONE SEEN  ?CBC     Status: Abnormal  ? Collection Time: 04/22/21 10:52 AM  ?Result Value Ref Range  ? WBC 5.8 4.0 - 10.5 K/uL  ? RBC 4.27 3.87 - 5.11 MIL/uL  ? Hemoglobin 11.7 (L) 12.0 - 15.0 g/dL  ? HCT 34.1 (L) 36.0 - 46.0 %  ? MCV 79.9 (L) 80.0 - 100.0 fL  ? MCH 27.4 26.0 - 34.0 pg  ? MCHC 34.3 30.0 - 36.0 g/dL  ? RDW 16.0 (H) 11.5 - 15.5 %  ? Platelets 414 (H) 150 - 400 K/uL  ? nRBC 0.0 0.0 - 0.2 %  ?Comprehensive metabolic panel     Status: Abnormal  ? Collection Time: 04/22/21 10:52 AM  ?Result Value Ref Range  ? Sodium 132 (L) 135 - 145 mmol/L  ? Potassium 3.6 3.5 - 5.1 mmol/L  ? Chloride 95 (L) 98 - 111 mmol/L  ? CO2 23 22 - 32 mmol/L  ?  Glucose, Bld 90 70 - 99 mg/dL  ? BUN <5 (L) 6 - 20 mg/dL  ? Creatinine, Ser 0.61 0.44 - 1.00 mg/dL  ? Calcium 9.3 8.9 - 10.3 mg/dL  ? Total Protein 7.2 6.5 - 8.1 g/dL  ? Albumin 3.4 (L) 3.5 - 5.0  g/dL  ? AST 19 15 - 41 U/L  ? ALT 27 0 - 44 U/L  ? Alkaline Phosphatase 85 38 - 126 U/L  ? Total Bilirubin 1.8 (H) 0.3 - 1.2 mg/dL  ? GFR, Estimated >60 >60 mL/min  ? Anion gap 14 5 - 15  ?Urine rapid drug screen (hosp performed)     Status: Abnormal  ? Collection Time: 04/22/21 12:25 PM  ?Result Value Ref Range  ? Opiates NONE DETECTED NONE DETECTED  ? Cocaine NONE DETECTED NONE DETECTED  ? Benzodiazepines NONE DETECTED NONE DETECTED  ? Amphetamines NONE DETECTED NONE DETECTED  ? Tetrahydrocannabinol POSITIVE (A) NONE DETECTED  ? Barbiturates NONE DETECTED NONE DETECTED  ? ?MAU Course  ?Procedures ?LR ?Pepcid ?Compazine ?MDM ?Labs ordered and reviewed. +UDS, pt adamant hasn't used in 2 mos. Feeling better after IVF and meds.  No further emesis.  Consult with Dr. Para March regarding burn recommends bacitracin ointment, nonadhesive pads, and referral to dermatology.  Patient is stable for discharge home. ?Assessment and Plan  ? ?1. Morning sickness   ?2. Dehydration   ?3. Superficial burn of lower back, initial encounter   ?4. [redacted] weeks gestation of pregnancy   ? ?Discharge home ?Follow-up with [facility] as scheduled ?Referral to dermatology ?Rx Compazine ?Rx Pepcid ?Return precautions ? ?Allergies as of 04/22/2021   ?No Known Allergies ?  ? ?  ?Medication List  ?  ? ?STOP taking these medications   ? ?ondansetron 4 MG disintegrating tablet ?Commonly known as: ZOFRAN-ODT ?  ? ?  ? ?TAKE these medications   ? ?bacitracin ointment ?Apply 1 application. topically 3 (three) times daily. ?  ?famotidine 20 MG tablet ?Commonly known as: PEPCID ?Take 1 tablet (20 mg total) by mouth at bedtime. ?  ?NIFEdipine 30 MG 24 hr tablet ?Commonly known as: PROCARDIA-XL/NIFEDICAL-XL ?Take 1 tablet (30 mg total) by mouth daily. ?  ?Prenatal Adult Gummy/DHA/FA 0.4-25 MG Chew ?Chew 1 Dose by mouth daily. Daily dose indicated on package. ?  ?PreTAB 29-1 MG Tabs ?Take 1 tablet by mouth daily. ?  ?prochlorperazine 10 MG tablet ?Commonly known as:  COMPAZINE ?Take 1 tablet (10 mg total) by mouth 3 (three) times daily as needed for nausea or vomiting. ?  ?promethazine 25 MG suppository ?Commonly known as: PHENERGAN ?Place 1 suppository (25 mg total) rectally every

## 2021-04-22 NOTE — MAU Note (Addendum)
Ongoing vomiting.  Meds aren't working.  Can't keep anything down.  5#loss noted this week. Having abd pain, started this week.  "Just hurts".  No bleeding.  ?Urine sent. ? ?Pt reports she fell in the shower yesterday, her foot was numb and she collapsed.  Bruise noted on rt knee and post upper inner left thigh. Also reported 'shower burn' on her back.  Large red area on mid to lower back ~4" x10", scattered open areas possibly from popped blisters.  States just can't stay out of shower. ? ?Significant other expressed concern for her condition.  Ongoing vomiting.  Can't keep anything down, zofran seems to be only thing working. (Is on her med list- pt had reported taking it this morning) ?

## 2021-04-23 LAB — CULTURE, OB URINE

## 2021-04-25 ENCOUNTER — Encounter: Payer: Self-pay | Admitting: Family Medicine

## 2021-04-25 ENCOUNTER — Inpatient Hospital Stay (HOSPITAL_COMMUNITY)
Admission: AD | Admit: 2021-04-25 | Discharge: 2021-04-25 | Disposition: A | Discharge status: Home / Self Care | Payer: 59 | Attending: Obstetrics & Gynecology | Admitting: Obstetrics & Gynecology

## 2021-04-25 ENCOUNTER — Other Ambulatory Visit: Payer: Self-pay

## 2021-04-25 ENCOUNTER — Encounter (HOSPITAL_COMMUNITY): Payer: Self-pay | Admitting: Obstetrics & Gynecology

## 2021-04-25 DIAGNOSIS — O26891 Other specified pregnancy related conditions, first trimester: Secondary | ICD-10-CM | POA: Diagnosis present

## 2021-04-25 DIAGNOSIS — Z3A11 11 weeks gestation of pregnancy: Secondary | ICD-10-CM | POA: Diagnosis not present

## 2021-04-25 DIAGNOSIS — R1030 Lower abdominal pain, unspecified: Secondary | ICD-10-CM | POA: Insufficient documentation

## 2021-04-25 DIAGNOSIS — Z3491 Encounter for supervision of normal pregnancy, unspecified, first trimester: Secondary | ICD-10-CM

## 2021-04-25 DIAGNOSIS — R319 Hematuria, unspecified: Secondary | ICD-10-CM | POA: Diagnosis not present

## 2021-04-25 DIAGNOSIS — O2341 Unspecified infection of urinary tract in pregnancy, first trimester: Secondary | ICD-10-CM | POA: Insufficient documentation

## 2021-04-25 LAB — URINALYSIS, ROUTINE W REFLEX MICROSCOPIC
Bilirubin Urine: NEGATIVE
Glucose, UA: NEGATIVE mg/dL
Ketones, ur: NEGATIVE mg/dL
Nitrite: NEGATIVE
Protein, ur: 30 mg/dL — AB
RBC / HPF: >50 RBC/hpf — ABNORMAL HIGH (ref 0–5)
Specific Gravity, Urine: 1.005 (ref 1.005–1.030)
WBC, UA: >50 WBC/hpf — ABNORMAL HIGH (ref 0–5)
pH: 7 (ref 5.0–8.0)

## 2021-04-25 LAB — WET PREP, GENITAL
Clue Cells Wet Prep HPF POC: NONE SEEN
Sperm: NONE SEEN
Trich, Wet Prep: NONE SEEN
WBC, Wet Prep HPF POC: <10 (ref <10)

## 2021-04-25 MED ORDER — ONDANSETRON 4 MG PO TBDP
8.0000 mg | ORAL_TABLET | Freq: Once | ORAL | Status: AC
  Administered 2021-04-25: 8 mg via ORAL
  Filled 2021-04-25: qty 2

## 2021-04-25 MED ORDER — CYCLOBENZAPRINE HCL 5 MG PO TABS
10.0000 mg | ORAL_TABLET | Freq: Once | ORAL | Status: AC
  Administered 2021-04-25: 10 mg via ORAL
  Filled 2021-04-25: qty 2

## 2021-04-25 MED ORDER — CEFADROXIL 500 MG PO CAPS: 500.0000 mg | ORAL_CAPSULE | Freq: Two times a day (BID) | ORAL | 0 refills | Status: AC

## 2021-04-25 MED ORDER — ACETAMINOPHEN 500 MG PO TABS
1000.0000 mg | ORAL_TABLET | Freq: Once | ORAL | Status: AC
  Administered 2021-04-25: 1000 mg via ORAL
  Filled 2021-04-25: qty 2

## 2021-04-25 NOTE — MAU Note (Signed)
Carrie [name] is a 31 y.o. at [redacted]w[redacted]d here in MAU reporting: vaginal bleeding or bleeding with urination. Pt states she has burning pain with urination. Streak of blood noted on tissue after urination x1 then blood mixed with urine. Pt is not wearing a pad. ? ?Onset of complaint: 1700 ?Pain score: 10 ?Vitals:  ? 04/25/21 2153  ?BP: 127/87  ?Pulse: (!) 111  ?Resp: 17  ?Temp: 98.1 ?F (36.7 ?C)  ?SpO2: 100%  ?   ?FHT:164 ? ?

## 2021-04-26 LAB — GC/CHLAMYDIA PROBE AMP (~~LOC~~) NOT AT ARMC
Chlamydia: NEGATIVE
Comment: NEGATIVE
Comment: NORMAL
Neisseria Gonorrhea: NEGATIVE

## 2021-04-26 NOTE — MAU Provider Note (Signed)
Chief Complaint:  Vaginal Bleeding and Dysuria ? ? Event Date/Time  ? First Provider Initiated Contact with Patient 04/25/21 2220   ?  ?HPI: Carrie [name] is a 31 y.o. [phone] at [redacted]w[redacted]d who presents to maternity admissions reporting urinary symptoms including lower abdominal cramping, blood in her urine, dysuria and urgency. Denies nausea/vomiting today but is concerned she will get nauseous if she takes meds.  No other physical complaints. ? ?Pregnancy Course: Receives care at [facility]. Prenatal records reviewed. ? ?Past Medical History:  ?Diagnosis Date  ? Asthma   ? Bipolar 2 disorder (HCC)   ? Hypertension   ? Low blood potassium   ? Psychiatric disorder   ? Vitamin D deficiency disease   ? ?OB History  ?Gravida Para Term Preterm AB Living  ?4 2 2   1 2   ?SAB IAB Ectopic Multiple Live Births  ?  1   0 2  ?  ?# Outcome Date GA Lbr Len/2nd Weight Sex Delivery Anes PTL Lv  ?4 Current           ?3 Term 07/29/14 [redacted]w[redacted]d 05:00 / 00:11 7 lb 6.7 oz (3.365 kg) F Vag-Spont None  LIV  ?   Birth Comments: none  ?2 IAB 02/07/11          ?1 Term 09/06/07 [redacted]w[redacted]d  7 lb 11 oz (3.487 kg) M Vag-Spont None N LIV  ?   Birth Comments: wnl  ? ?Past Surgical History:  ?Procedure Laterality Date  ? TONSILLECTOMY    ? ?Family History  ?Problem Relation Age of Onset  ? ADD / ADHD Mother   ?     suspected  ? Healthy Father   ? Depression Sister   ? ADD / ADHD Brother   ? Seizures Son   ? Mental illness Paternal Uncle   ?     schizophrenia, bipolar, ADHD  ? Cancer Neg Hx   ? Diabetes Neg Hx   ? Heart disease Neg Hx   ? Hypertension Neg Hx   ? Stroke Neg Hx   ? COPD Neg Hx   ? ?Social History  ? ?Tobacco Use  ? Smoking status: Never  ? Smokeless tobacco: Never  ?Vaping Use  ? Vaping Use: Never used  ?Substance Use Topics  ? Alcohol use: No  ? Drug use: Not Currently  ?  Types: Marijuana  ?  Comment: month ago  ? ?No Known Allergies ?No medications prior to admission.  ? ?I have reviewed patient's Past Medical Hx, Surgical Hx, Family Hx, Social  Hx, medications and allergies.  ? ?ROS:  ?Pertinent items noted in HPI and remainder of comprehensive ROS otherwise negative.  ? ?Physical Exam  ?Patient Vitals for the past 24 hrs: ? BP Temp Temp src Pulse Resp SpO2 Height Weight  ?04/25/21 2340 126/87 -- -- (!) 102 -- -- -- --  ?04/25/21 2153 127/87 98.1 ?F (36.7 ?C) Oral (!) 111 17 100 % 5\' 1"  (1.549 m) 169 lb 6.4 oz (76.8 kg)  ? ?Constitutional: Well-developed, well-nourished female in no acute distress.  ?Cardiovascular: normal rate & rhythm ?Respiratory: normal effort ?GI: Abd soft, non-tender, gravid appropriate for gestational age ?MS: Extremities nontender, no edema, normal ROM ?Neurologic: Alert and oriented x 4.  ?GU: no CVA tenderness ?Pelvic: NEFG, physiologic discharge, no blood, cervix clean. Staining noted around urethra ? ?FHR: 164 ?  ?Labs: ?Results for orders placed or performed during the hospital encounter of 04/25/21 (from the past 24 hour(s))  ?Urinalysis, Routine w  reflex microscopic Urine, Clean Catch     Status: Abnormal  ? Collection Time: 04/25/21 10:12 PM  ?Result Value Ref Range  ? Color, Urine AMBER (A) YELLOW  ? APPearance HAZY (A) CLEAR  ? Specific Gravity, Urine 1.005 1.005 - 1.030  ? pH 7.0 5.0 - 8.0  ? Glucose, UA NEGATIVE NEGATIVE mg/dL  ? Hgb urine dipstick LARGE (A) NEGATIVE  ? Bilirubin Urine NEGATIVE NEGATIVE  ? Ketones, ur NEGATIVE NEGATIVE mg/dL  ? Protein, ur 30 (A) NEGATIVE mg/dL  ? Nitrite NEGATIVE NEGATIVE  ? Leukocytes,Ua LARGE (A) NEGATIVE  ? RBC / HPF >50 (H) 0 - 5 RBC/hpf  ? WBC, UA >50 (H) 0 - 5 WBC/hpf  ? Bacteria, UA FEW (A) NONE SEEN  ? Squamous Epithelial / LPF 6-10 0 - 5  ? Mucus PRESENT   ?Wet prep, genital     Status: Abnormal  ? Collection Time: 04/25/21 10:29 PM  ?Result Value Ref Range  ? Yeast Wet Prep HPF POC PRESENT (A) NONE SEEN  ? Trich, Wet Prep NONE SEEN NONE SEEN  ? Clue Cells Wet Prep HPF POC NONE SEEN NONE SEEN  ? WBC, Wet Prep HPF POC <10 <10  ? Sperm NONE SEEN   ? ?Imaging:  ?No results  found. ? ?MAU Course: ?Orders Placed This Encounter  ?Procedures  ? Wet prep, genital  ? Urinalysis, Routine w reflex microscopic Urine, Clean Catch  ? CBC with Differential/Platelet  ? Discharge patient  ? ?Meds ordered this encounter  ?Medications  ? ondansetron (ZOFRAN-ODT) disintegrating tablet 8 mg  ? acetaminophen (TYLENOL) tablet 1,000 mg  ? cyclobenzaprine (FLEXERIL) tablet 10 mg  ? cefadroxil (DURICEF) 500 MG capsule  ?  Sig: Take 1 capsule (500 mg total) by mouth 2 (two) times daily for 14 days.  ?  Dispense:  28 capsule  ?  Refill:  0  ?  Order Specific Question:   Supervising Provider  ?  Answer:   Reva Bores [[id number]]  ? ?MDM: ?Once speculum exam confirmed blood not vaginal in origin, pt given Tylenol and flexeril for pain which helped decrease her cramping. UA positive for UTI, sent for culture. ? ?Assessment: ?1. UTI (urinary tract infection) during pregnancy, first trimester   ?2. [redacted] weeks gestation of pregnancy   ?3. Presence of fetal heart sounds in first trimester   ? ?Plan: ?Discharge home in stable condition with return precautions ?  ? Follow-up Information   ? ? [facility] Follow up.   ?Specialty: Obstetrics and Gynecology ?Why: as scheduled for ongoing prenatal care ?Contact information: ?[address] ?[place] [address] ?[phone] ? ?  ?  ? ?  ?  ? ?  ?  ?Allergies as of 04/25/2021   ?No Known Allergies ?  ? ?  ?Medication List  ?  ? ?TAKE these medications   ? ?bacitracin ointment ?Apply 1 application. topically 3 (three) times daily. ?  ?cefadroxil 500 MG capsule ?Commonly known as: DURICEF ?Take 1 capsule (500 mg total) by mouth 2 (two) times daily for 14 days. ?  ?famotidine 20 MG tablet ?Commonly known as: PEPCID ?Take 1 tablet (20 mg total) by mouth at bedtime. ?  ?NIFEdipine 30 MG 24 hr tablet ?Commonly known as: PROCARDIA-XL/NIFEDICAL-XL ?Take 1 tablet (30 mg total) by mouth daily. ?  ?Prenatal Adult  Gummy/DHA/FA 0.4-25 MG Chew ?Chew 1 Dose by mouth daily. Daily dose indicated on package. ?  ?PreTAB 29-1  MG Tabs ?Take 1 tablet by mouth daily. ?  ?prochlorperazine 10 MG tablet ?Commonly known as: COMPAZINE ?Take 1 tablet (10 mg total) by mouth 3 (three) times daily as needed for nausea or vomiting. ?  ?promethazine 25 MG suppository ?Commonly known as: PHENERGAN ?Place 1 suppository (25 mg total) rectally every 6 (six) hours as needed for nausea or vomiting. ?  ?promethazine 25 MG tablet ?Commonly known as: PHENERGAN ?Take 1 tablet (25 mg total) by mouth every 6 (six) hours as needed for nausea or vomiting. ?  ?scopolamine 1 MG/3DAYS ?Commonly known as: TRANSDERM-SCOP ?Place 1 patch (1.5 mg total) onto the skin every 3 (three) days. ?  ? ?  ? ?Edd ArbourJamilla Zelena Bushong, CNM, MSN, IBCLC ?Certified Nurse Midwife, [facility] ? ? ? ? ?

## 2021-04-27 ENCOUNTER — Other Ambulatory Visit: Payer: Self-pay

## 2021-04-27 ENCOUNTER — Ambulatory Visit (INDEPENDENT_AMBULATORY_CARE_PROVIDER_SITE_OTHER): Payer: 59 | Admitting: Dermatology

## 2021-04-27 DIAGNOSIS — X110XXA Contact with hot water in bath or tub, initial encounter: Secondary | ICD-10-CM

## 2021-04-27 DIAGNOSIS — T300 Burn of unspecified body region, unspecified degree: Secondary | ICD-10-CM

## 2021-04-27 DIAGNOSIS — Y93E1 Activity, personal bathing and showering: Secondary | ICD-10-CM

## 2021-04-27 DIAGNOSIS — T2109XA Burn of unspecified degree of other site of trunk, initial encounter: Secondary | ICD-10-CM

## 2021-04-27 MED ORDER — MUPIROCIN 2 % EX OINT: 1.0000 "application " | TOPICAL_OINTMENT | Freq: Every day | CUTANEOUS | 1 refills | Status: DC

## 2021-04-27 MED ORDER — TRIAMCINOLONE ACETONIDE 0.1 % EX OINT: 1.0000 "application " | TOPICAL_OINTMENT | Freq: Every day | CUTANEOUS | 0 refills | Status: DC
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
## 2021-04-27 NOTE — Patient Instructions (Signed)

## 2021-04-30 ENCOUNTER — Other Ambulatory Visit: Payer: Self-pay

## 2021-04-30 ENCOUNTER — Inpatient Hospital Stay (HOSPITAL_COMMUNITY)
Admission: AD | Admit: 2021-04-30 | Discharge: 2021-05-01 | Disposition: A | Discharge status: Home / Self Care | Payer: 59 | Attending: Obstetrics and Gynecology | Admitting: Obstetrics and Gynecology

## 2021-04-30 DIAGNOSIS — E876 Hypokalemia: Secondary | ICD-10-CM

## 2021-04-30 DIAGNOSIS — O209 Hemorrhage in early pregnancy, unspecified: Secondary | ICD-10-CM | POA: Insufficient documentation

## 2021-04-30 DIAGNOSIS — O211 Hyperemesis gravidarum with metabolic disturbance: Secondary | ICD-10-CM | POA: Insufficient documentation

## 2021-04-30 DIAGNOSIS — O0991 Supervision of high risk pregnancy, unspecified, first trimester: Secondary | ICD-10-CM | POA: Insufficient documentation

## 2021-04-30 DIAGNOSIS — Z3A12 12 weeks gestation of pregnancy: Secondary | ICD-10-CM | POA: Insufficient documentation

## 2021-04-30 DIAGNOSIS — O10011 Pre-existing essential hypertension complicating pregnancy, first trimester: Secondary | ICD-10-CM | POA: Insufficient documentation

## 2021-04-30 NOTE — MAU Note (Addendum)
Carrie [name] is a 31 y.o. at 102w1d here in MAU reporting: nausea and vomiting-states she has been able to hold food or liquids down since Tuesday. Denies vaginal bleeding or bloody show or SROM, or uterine cramping. ? ? ?Vitals:  ? 04/30/21 2326  ?BP: (!) 136/96  ?Pulse: (!) 121  ?Resp: 17  ?Temp: 97.9 ?F (36.6 ?C)  ?SpO2: 99%  ?   ?[id number] to auscultate FHR-will obtain in MAU room ?Lab orders placed from triage:  UA ?

## 2021-05-01 ENCOUNTER — Encounter (HOSPITAL_COMMUNITY): Payer: Self-pay | Admitting: Obstetrics and Gynecology

## 2021-05-01 ENCOUNTER — Encounter: Payer: Self-pay | Admitting: Dermatology

## 2021-05-01 ENCOUNTER — Inpatient Hospital Stay (HOSPITAL_COMMUNITY): Payer: 59

## 2021-05-01 ENCOUNTER — Encounter: Payer: Self-pay | Admitting: Family Medicine

## 2021-05-01 ENCOUNTER — Other Ambulatory Visit: Payer: Self-pay

## 2021-05-01 ENCOUNTER — Inpatient Hospital Stay (EMERGENCY_DEPARTMENT_HOSPITAL)
Admission: AD | Admit: 2021-05-01 | Discharge: 2021-05-01 | Disposition: A | Discharge status: Home / Self Care | Payer: 59 | Source: Home / Self Care | Attending: Obstetrics & Gynecology | Admitting: Obstetrics & Gynecology

## 2021-05-01 DIAGNOSIS — Z6790 Unspecified blood type, Rh positive: Secondary | ICD-10-CM | POA: Insufficient documentation

## 2021-05-01 DIAGNOSIS — Z3A12 12 weeks gestation of pregnancy: Secondary | ICD-10-CM

## 2021-05-01 DIAGNOSIS — O0991 Supervision of high risk pregnancy, unspecified, first trimester: Secondary | ICD-10-CM | POA: Insufficient documentation

## 2021-05-01 DIAGNOSIS — O0990 Supervision of high risk pregnancy, unspecified, unspecified trimester: Secondary | ICD-10-CM

## 2021-05-01 DIAGNOSIS — O211 Hyperemesis gravidarum with metabolic disturbance: Secondary | ICD-10-CM

## 2021-05-01 DIAGNOSIS — E876 Hypokalemia: Secondary | ICD-10-CM

## 2021-05-01 DIAGNOSIS — O4690 Antepartum hemorrhage, unspecified, unspecified trimester: Secondary | ICD-10-CM

## 2021-05-01 DIAGNOSIS — O209 Hemorrhage in early pregnancy, unspecified: Secondary | ICD-10-CM | POA: Insufficient documentation

## 2021-05-01 DIAGNOSIS — O210 Mild hyperemesis gravidarum: Secondary | ICD-10-CM | POA: Diagnosis present

## 2021-05-01 DIAGNOSIS — O10011 Pre-existing essential hypertension complicating pregnancy, first trimester: Secondary | ICD-10-CM | POA: Diagnosis not present

## 2021-05-01 LAB — BASIC METABOLIC PANEL
Anion gap: 11 (ref 5–15)
BUN: 5 mg/dL — ABNORMAL LOW (ref 6–20)
CO2: 24 mmol/L (ref 22–32)
Calcium: 9.5 mg/dL (ref 8.9–10.3)
Chloride: 100 mmol/L (ref 98–111)
Creatinine, Ser: 0.58 mg/dL (ref 0.44–1.00)
GFR, Estimated: >60 mL/min (ref >60)
Glucose, Bld: 103 mg/dL — ABNORMAL HIGH (ref 70–99)
Potassium: 3 mmol/L — ABNORMAL LOW (ref 3.5–5.1)
Sodium: 135 mmol/L (ref 135–145)

## 2021-05-01 MED ORDER — LACTATED RINGERS IV BOLUS
1000.0000 mL | Freq: Once | INTRAVENOUS | Status: AC
  Administered 2021-05-01: 1000 mL via INTRAVENOUS

## 2021-05-01 MED ORDER — FAMOTIDINE IN NACL 20-0.9 MG/50ML-% IV SOLN
20.0000 mg | Freq: Once | INTRAVENOUS | Status: AC
  Administered 2021-05-01: 20 mg via INTRAVENOUS
  Filled 2021-05-01: qty 50

## 2021-05-01 MED ORDER — POTASSIUM CHLORIDE 10 MEQ/100ML IV SOLN
10.0000 meq | INTRAVENOUS | Status: AC
  Administered 2021-05-01 (×2): 10 meq via INTRAVENOUS
  Filled 2021-05-01 (×2): qty 100

## 2021-05-01 MED ORDER — PROCHLORPERAZINE EDISYLATE 10 MG/2ML IJ SOLN
10.0000 mg | Freq: Once | INTRAMUSCULAR | Status: AC
  Administered 2021-05-01: 10 mg via INTRAVENOUS
  Filled 2021-05-01: qty 2

## 2021-05-01 NOTE — MAU Provider Note (Signed)
?History  ?  ? ?[id number] ? ?Arrival date and time: 04/30/21 2214 ?  ? ?Chief Complaint  ?Patient presents with  ? Nausea  ? Emesis  ? Blurred Vision  ? ? ? ?HPI ?Carrie JacobsKristen Demonte is a 31 y.o. at 2074w2d by LMP with PMHx notable for chronic hypertension & hyperemesis, who presents for nausea & vomiting.  ?This has been an ongoing issue with the pregnancy. States she hasn't been able to keep anything down since Thursday. Has scopolamine patch in place since Friday. Reports taking all of her medication this morning - unclear if she took any additional antiemetics later in the day. Majority of information obtained from significant other as patient is not answering questions. States she was doing ok today until 6 pm while out for dinner. Ordered caesar salad & ribs but was unable to eat it. Vomited after food was brought to the table.  ?No fever, abdominal pain, or vaginal bleeding.  ? ?OB History   ? ? Gravida  ?4  ? Para  ?2  ? Term  ?2  ? Preterm  ?   ? AB  ?1  ? Living  ?2  ?  ? ? SAB  ?   ? IAB  ?1  ? Ectopic  ?   ? Multiple  ?0  ? Live Births  ?2  ?   ?  ?  ? ? ?Past Medical History:  ?Diagnosis Date  ? Asthma   ? Bipolar 2 disorder (HCC)   ? Hypertension   ? Low blood potassium   ? Psychiatric disorder   ? Vitamin D deficiency disease   ? ? ?Past Surgical History:  ?Procedure Laterality Date  ? TONSILLECTOMY    ? ? ?Family History  ?Problem Relation Age of Onset  ? ADD / ADHD Mother   ?     suspected  ? Healthy Father   ? Depression Sister   ? ADD / ADHD Brother   ? Seizures Son   ? Mental illness Paternal Uncle   ?     schizophrenia, bipolar, ADHD  ? Cancer Neg Hx   ? Diabetes Neg Hx   ? Heart disease Neg Hx   ? Hypertension Neg Hx   ? Stroke Neg Hx   ? COPD Neg Hx   ? ? ?No Known Allergies ? ?No current facility-administered medications on file prior to encounter.  ? ?Current Outpatient Medications on File Prior to Encounter  ?Medication Sig Dispense Refill  ? bacitracin ointment Apply 1 application. topically 3  (three) times daily. 120 g 0  ? cefadroxil (DURICEF) 500 MG capsule Take 1 capsule (500 mg total) by mouth 2 (two) times daily for 14 days. 28 capsule 0  ? famotidine (PEPCID) 20 MG tablet Take 1 tablet (20 mg total) by mouth at bedtime. 30 tablet 0  ? mupirocin ointment (BACTROBAN) 2 % Apply 1 application. topically daily. Qd to burn on back 44 g 1  ? NIFEdipine (PROCARDIA-XL/NIFEDICAL-XL) 30 MG 24 hr tablet Take 1 tablet (30 mg total) by mouth daily. 30 tablet 5  ? Prenatal MV & Min w/FA-DHA (PRENATAL ADULT GUMMY/DHA/FA) 0.4-25 MG CHEW Chew 1 Dose by mouth daily. Daily dose indicated on package. 30 tablet 11  ? Prenatal Vit-Fe Fumarate-FA (PRETAB) 29-1 MG TABS Take 1 tablet by mouth daily. 90 tablet 3  ? prochlorperazine (COMPAZINE) 10 MG tablet Take 1 tablet (10 mg total) by mouth 3 (three) times daily as needed for nausea or vomiting. 30 tablet 0  ?  promethazine (PHENERGAN) 25 MG suppository Place 1 suppository (25 mg total) rectally every 6 (six) hours as needed for nausea or vomiting. 15 each 1  ? promethazine (PHENERGAN) 25 MG tablet Take 1 tablet (25 mg total) by mouth every 6 (six) hours as needed for nausea or vomiting. 30 tablet 2  ? scopolamine (TRANSDERM-SCOP) 1 MG/3DAYS Place 1 patch (1.5 mg total) onto the skin every 3 (three) days. 10 patch 12  ? triamcinolone ointment (KENALOG) 0.1 % Apply 1 application. topically daily. Qd to burn on back, avoid face, groin, axilla 80 g 0  ? ? ? ?ROS ?Pertinent positives and negative per HPI, all others reviewed and negative ? ?Physical Exam  ? ?BP 126/83   Pulse (!) 102   Temp 97.9 ?F (36.6 ?C) (Oral)   Resp 17   Ht 5\' 1"  (1.549 m)   Wt 74.4 kg   LMP 02/04/2021   SpO2 99%   BMI 30.99 kg/m?  ? ?Patient Vitals for the past 24 hrs: ? BP Temp Temp src Pulse Resp SpO2 Height Weight  ?05/01/21 0647 126/83 -- -- (!) 102 -- -- -- --  ?05/01/21 0052 (!) 145/98 -- -- (!) 104 -- -- -- --  ?04/30/21 2326 (!) 136/96 97.9 ?F (36.6 ?C) Oral (!) 121 17 99 % 5\' 1"  (1.549  m) 74.4 kg  ? ? ?Physical Exam ?Vitals and nursing note reviewed.  ?Constitutional:   ?   Appearance: Normal appearance. She is not toxic-appearing or diaphoretic.  ?HENT:  ?   Head: Normocephalic and atraumatic.  ?Cardiovascular:  ?   Rate and Rhythm: Tachycardia present.  ?Pulmonary:  ?   Effort: Pulmonary effort is normal. No respiratory distress.  ?Neurological:  ?   Mental Status: She is alert.  ?Psychiatric:     ?   Mood and Affect: Mood normal.     ?   Behavior: Behavior normal.  ?  ? ?Labs ?Results for orders placed or performed during the hospital encounter of 04/30/21 (from the past 24 hour(s))  ?Basic metabolic panel     Status: Abnormal  ? Collection Time: 05/01/21  1:13 AM  ?Result Value Ref Range  ? Sodium 135 135 - 145 mmol/L  ? Potassium 3.0 (L) 3.5 - 5.1 mmol/L  ? Chloride 100 98 - 111 mmol/L  ? CO2 24 22 - 32 mmol/L  ? Glucose, Bld 103 (H) 70 - 99 mg/dL  ? BUN 5 (L) 6 - 20 mg/dL  ? Creatinine, Ser 0.58 0.44 - 1.00 mg/dL  ? Calcium 9.5 8.9 - 10.3 mg/dL  ? GFR, Estimated >60 >60 mL/min  ? Anion gap 11 5 - 15  ? ? ?Imaging ?No results found. ? ?MAU Course  ?Procedures ?Lab Orders    ?     Urinalysis, Routine w reflex microscopic Urine, Clean Catch    ?     Basic metabolic panel    ?Meds ordered this encounter  ?Medications  ? lactated ringers bolus 1,000 mL  ? famotidine (PEPCID) IVPB 20 mg premix  ? prochlorperazine (COMPAZINE) injection 10 mg  ? potassium chloride 10 mEq in 100 mL IVPB  ? ?Imaging Orders  ?No imaging studies ordered today  ? ? ?MDM ?FHT present via doppler ? ?Patient presents for continued nausea & vomiting. Treated with IV fluids, compazine, & pepcid. No vomiting while in MAU. Discussed with patient & partner to avoid triggers such as smells. Continue to take medications on a schedule. Also discussed possibility of outpatient infusion center -  will send message to the office.  ? ?Ordered & reviewed labs. Hypokalemia treated with 20 meq of IV potassium.  ?Assessment and Plan  ? ?1.  Hyperemesis gravidarum with dehydration   ?2. Hypokalemia due to excessive gastrointestinal loss of potassium   ?3. [redacted] weeks gestation of pregnancy   ? ?-Continue medications as prescribed ?-avoid triggers ?-Message to office for outpatient infusions ? ? ?Judeth Horn, NP ?05/01/21 ?6:54 AM ? ? ?

## 2021-05-01 NOTE — MAU Provider Note (Signed)
?History  ?  ? ?CSN: [id number] ? ?Arrival date and time: 05/01/21 1333 ? ? Event Date/Time  ? First Provider Initiated Contact with Patient 05/01/21 1425   ?  ? ?Chief Complaint  ?Patient presents with  ? Vaginal Bleeding  ? ?Ms. Carrie [name] is a 31 y.o. [phone] at [redacted]w[redacted]d who presents to MAU for vaginal bleeding which began around noon today. Patient states she got in the shower and felt a gush of dark red blood. Shows provider a photo of a small amount of dark red blood on the tub floor, tub was empty. Patient also shows provider a pad she wore from home with a moderate amount of blood - pad was small and thin. Patient reports she is continuing to have a small amount of bleeding at this time. Patient denies any abdominal pain or cramping. Patient had confirmed IUP on 04/14/2021. ? ?Results and [place] from 04/14/2021 reviewed in chart. ? ?Patient's partner present for entire visit. ? ?Pt denies vaginal discharge/odor/itching. ?Pt denies N/V, abdominal pain, constipation, diarrhea, or urinary problems. ?Pt denies fever, chills, fatigue, sweating or changes in appetite. ?Pt denies SOB or chest pain. ?Pt denies dizziness, HA, light-headedness, weakness. ? ? ?OB History   ? ? Gravida  ?4  ? Para  ?2  ? Term  ?2  ? Preterm  ?   ? AB  ?1  ? Living  ?2  ?  ? ? SAB  ?   ? IAB  ?1  ? Ectopic  ?   ? Multiple  ?0  ? Live Births  ?2  ?   ?  ?  ? ? ?Past Medical History:  ?Diagnosis Date  ? Asthma   ? Bipolar 2 disorder (HCC)   ? Hypertension   ? Low blood potassium   ? Psychiatric disorder   ? Vitamin D deficiency disease   ? ? ?Past Surgical History:  ?Procedure Laterality Date  ? TONSILLECTOMY    ? ? ?Family History  ?Problem Relation Age of Onset  ? ADD / ADHD Mother   ?     suspected  ? Healthy Father   ? Depression Sister   ? ADD / ADHD Brother   ? Seizures Son   ? Mental illness Paternal Uncle   ?     schizophrenia, bipolar, ADHD  ? Cancer Neg Hx   ? Diabetes Neg Hx   ? Heart disease Neg Hx   ? Hypertension Neg Hx   ?  Stroke Neg Hx   ? COPD Neg Hx   ? ? ?Social History  ? ?Tobacco Use  ? Smoking status: Never  ? Smokeless tobacco: Never  ?Vaping Use  ? Vaping Use: Never used  ?Substance Use Topics  ? Alcohol use: No  ? Drug use: Not Currently  ?  Types: Marijuana  ?  Comment: month ago  ? ? ?Allergies: No Known Allergies ? ?Medications Prior to Admission  ?Medication Sig Dispense Refill Last Dose  ? bacitracin ointment Apply 1 application. topically 3 (three) times daily. 120 g 0   ? cefadroxil (DURICEF) 500 MG capsule Take 1 capsule (500 mg total) by mouth 2 (two) times daily for 14 days. 28 capsule 0   ? famotidine (PEPCID) 20 MG tablet Take 1 tablet (20 mg total) by mouth at bedtime. 30 tablet 0   ? mupirocin ointment (BACTROBAN) 2 % Apply 1 application. topically daily. Qd to burn on back 44 g 1   ? NIFEdipine (PROCARDIA-XL/NIFEDICAL-XL) 30 MG  24 hr tablet Take 1 tablet (30 mg total) by mouth daily. 30 tablet 5   ? Prenatal MV & Min w/FA-DHA (PRENATAL ADULT GUMMY/DHA/FA) 0.4-25 MG CHEW Chew 1 Dose by mouth daily. Daily dose indicated on package. 30 tablet 11   ? Prenatal Vit-Fe Fumarate-FA (PRETAB) 29-1 MG TABS Take 1 tablet by mouth daily. 90 tablet 3   ? prochlorperazine (COMPAZINE) 10 MG tablet Take 1 tablet (10 mg total) by mouth 3 (three) times daily as needed for nausea or vomiting. 30 tablet 0   ? promethazine (PHENERGAN) 25 MG suppository Place 1 suppository (25 mg total) rectally every 6 (six) hours as needed for nausea or vomiting. 15 each 1   ? promethazine (PHENERGAN) 25 MG tablet Take 1 tablet (25 mg total) by mouth every 6 (six) hours as needed for nausea or vomiting. 30 tablet 2   ? scopolamine (TRANSDERM-SCOP) 1 MG/3DAYS Place 1 patch (1.5 mg total) onto the skin every 3 (three) days. 10 patch 12   ? triamcinolone ointment (KENALOG) 0.1 % Apply 1 application. topically daily. Qd to burn on back, avoid face, groin, axilla 80 g 0   ? ? ?Review of Systems  ?Constitutional:  Negative for chills, diaphoresis,  fatigue and fever.  ?Eyes:  Negative for visual disturbance.  ?Respiratory:  Negative for shortness of breath.   ?Cardiovascular:  Negative for chest pain.  ?Gastrointestinal:  Negative for abdominal pain, constipation, diarrhea, nausea and vomiting.  ?Genitourinary:  Positive for vaginal bleeding. Negative for dysuria, flank pain, frequency, pelvic pain, urgency and vaginal discharge.  ?Neurological:  Negative for dizziness, weakness, light-headedness and headaches.  ? ?Physical Exam  ? ?Blood pressure 132/88, pulse (!) 110, temperature 98.2 ?F (36.8 ?C), temperature source Oral, resp. rate 18, height 5\' 1"  (1.549 m), weight 74.5 kg, last menstrual period 02/04/2021, SpO2 100 %. ? ?Patient Vitals for the past 24 hrs: ? BP Temp Temp src Pulse Resp SpO2 Height Weight  ?05/01/21 1400 132/88 98.2 ?F (36.8 ?C) Oral (!) 110 18 100 % -- --  ?05/01/21 1355 -- -- -- -- -- -- 5\' 1"  (1.549 m) 74.5 kg  ? ?Physical Exam ?Vitals and nursing note reviewed.  ?Constitutional:   ?   General: She is not in acute distress. ?   Appearance: Normal appearance. She is not ill-appearing, toxic-appearing or diaphoretic.  ?HENT:  ?   Head: Normocephalic and atraumatic.  ?Pulmonary:  ?   Effort: Pulmonary effort is normal.  ?Neurological:  ?   Mental Status: She is alert and oriented to person, place, and time.  ?Psychiatric:     ?   Mood and Affect: Mood normal.     ?   Behavior: Behavior normal.     ?   Thought Content: Thought content normal.     ?   Judgment: Judgment normal.  ? ?Results for orders placed or performed during the hospital encounter of 04/30/21 (from the past 24 hour(s))  ?Basic metabolic panel     Status: Abnormal  ? Collection Time: 05/01/21  1:13 AM  ?Result Value Ref Range  ? Sodium 135 135 - 145 mmol/L  ? Potassium 3.0 (L) 3.5 - 5.1 mmol/L  ? Chloride 100 98 - 111 mmol/L  ? CO2 24 22 - 32 mmol/L  ? Glucose, Bld 103 (H) 70 - 99 mg/dL  ? BUN 5 (L) 6 - 20 mg/dL  ? Creatinine, Ser 0.58 0.44 - 1.00 mg/dL  ? Calcium 9.5 8.9  - 10.3 mg/dL  ?  GFR, Estimated >60 >60 mL/min  ? Anion gap 11 5 - 15  ? ?US OB Comp Less 14 Wks ? ?Result Date: 04/14/2021 ?CLINICAL DATA:  Verify dating. EXAM: OBSTETRIC <14 WK ULTRASOUND TECHNIQUE: Transabdominal ultrasound was performed for evaluation of the gestation as well as the maternal uterus and adnexal regions. COMPARISON:  None. FINDINGS: Intrauterine gestational sac: Single Yolk sac:  Visualized. Embryo:  Visualized. Cardiac Activity: Visualized. Heart Rate: 180 bpm CRL:   25.81 mm   9 w 3 d                  [place] EDC: November 14, 2021 Subchorionic hemorrhage:  None visualized. Maternal uterus/adnexae: A corpus luteum cyst is seen within an otherwise normal appearing right ovary. The left ovary is not visualized secondary to overlying bowel gas. IMPRESSION: Single, viable intrauterine pregnancy at approximately 9 weeks and 3 days gestation by ultrasound evaluation. Electronically Signed   By: Virgina Norfolk M.D.   On: 04/14/2021 21:45  ? ?US OB Transvaginal ? ?Result Date: 05/01/2021 ?CLINICAL DATA:  Vaginal bleeding, pregnancy EXAM: TRANSVAGINAL OB ULTRASOUND TECHNIQUE: Transvaginal ultrasound was performed for complete evaluation of the gestation as well as the maternal uterus, adnexal regions, and pelvic cul-de-sac. COMPARISON:  04/14/2021 FINDINGS: Intrauterine gestational sac: Single Yolk sac:  Visualized. Embryo:  Visualized. Cardiac Activity: Visualized. Heart Rate: 164 bpm CRL:   55.9 mm   12 w 1 d                  [place] EDC: 11/12/2021 Subchorionic hemorrhage:  None visualized. Maternal uterus/adnexae: Bilateral ovaries within normal limits. Cervix is closed measuring approximately 5.0 cm in length. IMPRESSION: 1. Single live intrauterine gestation measuring 12 weeks 1 day by crown-rump length. 2. Active fetal heart tones at 164 BPM. Electronically Signed   By: Davina Poke D.O.   On: 05/01/2021 15:42   ? ?MAU Course  ?Procedures ? ?MDM ?-pt with vaginal bleeding but no pain, minimal bleeding ?-US  performed for CL ?-pt called RN citing family emergency and stating that she needed to leave AMA ?-AMA papers signed by patient ?-[place]: single IUP, FHR 164, [redacted]w[redacted]d, CL 5.0cm, no [place] ?-pt left AMA ? ?Orders Place

## 2021-05-01 NOTE — MAU Note (Addendum)
Carrie [name] is a 31 y.o. at [redacted]w[redacted]d here in MAU reporting: VB that began @ noon today., denies passing clots Reports had a gush of dark red blood while showering.  Reports has saturated one sanitary napkin in 30 minutes. ?Denies recent intercourse. ?LMP: 02/04/2021 ?Onset of complaint: today @ 1200 ?Pain score: 0 ?Vitals:  ? 05/01/21 1400  ?BP: 132/88  ?Pulse: (!) 110  ?Resp: 18  ?Temp: 98.2 ?F (36.8 ?C)  ?SpO2: 100%  ?   ?FHT:166 bpm ?Lab orders placed from triage:   None ?

## 2021-05-01 NOTE — MAU Note (Signed)
Pt called out asking to leave AMA due to child care issues.  AMA form signed ?

## 2021-05-02 ENCOUNTER — Encounter (HOSPITAL_COMMUNITY): Payer: Self-pay | Admitting: Obstetrics and Gynecology

## 2021-05-02 ENCOUNTER — Inpatient Hospital Stay (HOSPITAL_COMMUNITY)
Admission: AD | Admit: 2021-05-02 | Discharge: 2021-05-02 | Disposition: A | Discharge status: Home / Self Care | Payer: 59 | Attending: Family Medicine | Admitting: Family Medicine

## 2021-05-02 DIAGNOSIS — O210 Mild hyperemesis gravidarum: Secondary | ICD-10-CM | POA: Insufficient documentation

## 2021-05-02 DIAGNOSIS — O4690 Antepartum hemorrhage, unspecified, unspecified trimester: Secondary | ICD-10-CM | POA: Diagnosis not present

## 2021-05-02 DIAGNOSIS — O0991 Supervision of high risk pregnancy, unspecified, first trimester: Secondary | ICD-10-CM | POA: Diagnosis not present

## 2021-05-02 DIAGNOSIS — Z3A12 12 weeks gestation of pregnancy: Secondary | ICD-10-CM | POA: Insufficient documentation

## 2021-05-02 DIAGNOSIS — O0990 Supervision of high risk pregnancy, unspecified, unspecified trimester: Secondary | ICD-10-CM

## 2021-05-02 DIAGNOSIS — E876 Hypokalemia: Secondary | ICD-10-CM | POA: Diagnosis not present

## 2021-05-02 DIAGNOSIS — O211 Hyperemesis gravidarum with metabolic disturbance: Secondary | ICD-10-CM | POA: Diagnosis not present

## 2021-05-02 LAB — CBC
HCT: 29.8 % — ABNORMAL LOW (ref 36.0–46.0)
Hemoglobin: 10.2 g/dL — ABNORMAL LOW (ref 12.0–15.0)
MCH: 27.8 pg (ref 26.0–34.0)
MCHC: 34.2 g/dL (ref 30.0–36.0)
MCV: 81.2 fL (ref 80.0–100.0)
Platelets: 330 10*3/uL (ref 150–400)
RBC: 3.67 MIL/uL — ABNORMAL LOW (ref 3.87–5.11)
RDW: 16 % — ABNORMAL HIGH (ref 11.5–15.5)
WBC: 7.3 10*3/uL (ref 4.0–10.5)
nRBC: 0 % (ref 0.0–0.2)

## 2021-05-02 LAB — COMPREHENSIVE METABOLIC PANEL
ALT: 33 U/L (ref 0–44)
AST: 24 U/L (ref 15–41)
Albumin: 3 g/dL — ABNORMAL LOW (ref 3.5–5.0)
Alkaline Phosphatase: 92 U/L (ref 38–126)
Anion gap: 8 (ref 5–15)
BUN: <5 mg/dL — ABNORMAL LOW (ref 6–20)
CO2: 24 mmol/L (ref 22–32)
Calcium: 8.8 mg/dL — ABNORMAL LOW (ref 8.9–10.3)
Chloride: 103 mmol/L (ref 98–111)
Creatinine, Ser: 0.57 mg/dL (ref 0.44–1.00)
GFR, Estimated: >60 mL/min (ref >60)
Glucose, Bld: 85 mg/dL (ref 70–99)
Potassium: 3.2 mmol/L — ABNORMAL LOW (ref 3.5–5.1)
Sodium: 135 mmol/L (ref 135–145)
Total Bilirubin: 0.8 mg/dL (ref 0.3–1.2)
Total Protein: 6.5 g/dL (ref 6.5–8.1)

## 2021-05-02 LAB — URINALYSIS, ROUTINE W REFLEX MICROSCOPIC
Bilirubin Urine: NEGATIVE
Glucose, UA: NEGATIVE mg/dL
Ketones, ur: 80 mg/dL — AB
Nitrite: NEGATIVE
Protein, ur: NEGATIVE mg/dL
Specific Gravity, Urine: 1.011 (ref 1.005–1.030)
pH: 6 (ref 5.0–8.0)

## 2021-05-02 MED ORDER — PROCHLORPERAZINE EDISYLATE 10 MG/2ML IJ SOLN
10.0000 mg | Freq: Once | INTRAMUSCULAR | Status: AC
  Administered 2021-05-02: 10 mg via INTRAVENOUS
  Filled 2021-05-02 (×2): qty 2

## 2021-05-02 MED ORDER — LACTATED RINGERS IV BOLUS
1000.0000 mL | Freq: Once | INTRAVENOUS | Status: AC
  Administered 2021-05-02: 1000 mL via INTRAVENOUS

## 2021-05-02 MED ORDER — SODIUM CHLORIDE 0.9 % IV SOLN
8.0000 mg | Freq: Once | INTRAVENOUS | Status: AC
  Administered 2021-05-02: 8 mg via INTRAVENOUS
  Filled 2021-05-02: qty 4

## 2021-05-02 MED ORDER — PROCHLORPERAZINE MALEATE 10 MG PO TABS: 10.0000 mg | ORAL_TABLET | Freq: Three times a day (TID) | ORAL | 3 refills | Status: DC | PRN

## 2021-05-02 MED ORDER — SORBITOL 70 % SOLN
960.0000 mL | TOPICAL_OIL | Freq: Once | ORAL | Status: AC
  Administered 2021-05-02: 960 mL via RECTAL
  Filled 2021-05-02: qty 473
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
## 2021-05-02 NOTE — MAU Note (Addendum)
...  Carrie [name] is a 31 y.o. at [redacted]w[redacted]d here in MAU reporting: N/V throughout her entire pregnancy. She is also experiencing vaginal bleeding that began yesterday. She states she was seen here in MAU yesterday but had to leave due to child care issues. She states she is still having vaginal bleeding and is wearing a panty liner. She states she passed one blood clot today and yesterday that was the size of two quarters. Denies recent IC. States she last used marijuana two months ago. ? ?Last doses: ?Phenergan suppository 1000 ?Nifedepine 1000  ?Pepcid 1000 ? ? ?Pain score:  ?7/10 throat  ? ?FHT: 155 doppler ?Lab orders placed from triage: UA ? ?

## 2021-05-04 ENCOUNTER — Encounter: Payer: Self-pay | Admitting: *Deleted

## 2021-05-04 ENCOUNTER — Telehealth: Payer: Self-pay | Admitting: *Deleted

## 2021-05-04 ENCOUNTER — Other Ambulatory Visit: Payer: Self-pay

## 2021-05-04 NOTE — Addendum Note (Signed)
Addended by: Jorje Guild B on: 05/04/2021 08:07 PM ? ? Modules accepted: Orders ? ?

## 2021-05-04 NOTE — Telephone Encounter (Signed)
Patient had sent scheduling appointment MyChart message and message transferred to nurse. I called Carrie [name] and left a message I was calling re: her message and wanted to discuss. I advised if she is bleeding like a period to go to the hospital now, if not, call us or message [place] with details.  ?I will send MyChart message also ?[name] ?

## 2021-05-04 NOTE — Telephone Encounter (Signed)
-----   Message from Judeth Horn, NP sent at 05/03/2021 12:11 PM EDT ----- ?See below - apparently I sent this to the wrong pool initially ? ? ?----- Message ----- ?From: Judeth Horn, NP ?Sent: 05/02/2021   9:30 AM EDT ?To: [facility] ? ?I would like her to be set up at the infusion center for IV fluids 3 times per week.  ?Do you know if they give compazine as well? That works well for her in MAU. If not, we can do phenergan or zofran. Just let me know what I need to do and I can put in orders.  ? ? ?

## 2021-05-04 NOTE — Telephone Encounter (Signed)
I called [facility] and inquired if they can give Compazine infusion and they verified they can. I messaged Erin to enter orders. I scheduled first visit for tomorrow at 1pm.   I called Maureen and left a message I was calling to reply to her question and also to notify of appointment. I left appointment details on her voicemail. ( [address] in [facility] building) ?Will also send mychart message. ?[name] ?

## 2021-05-05 ENCOUNTER — Ambulatory Visit (INDEPENDENT_AMBULATORY_CARE_PROVIDER_SITE_OTHER): Payer: 59

## 2021-05-05 DIAGNOSIS — F12188 Cannabis abuse with other cannabis-induced disorder: Secondary | ICD-10-CM | POA: Diagnosis not present

## 2021-05-05 DIAGNOSIS — O211 Hyperemesis gravidarum with metabolic disturbance: Secondary | ICD-10-CM | POA: Diagnosis not present

## 2021-05-05 MED ORDER — PROCHLORPERAZINE EDISYLATE 10 MG/2ML IJ SOLN
10.0000 mg | INTRAMUSCULAR | Status: DC
  Administered 2021-05-05: 10 mg via INTRAVENOUS
  Filled 2021-05-05: qty 2

## 2021-05-05 MED ORDER — LACTATED RINGERS IV BOLUS
1000.0000 mL | INTRAVENOUS | Status: DC
  Administered 2021-05-05: 1000 mL via INTRAVENOUS

## 2021-05-05 NOTE — Patient Instructions (Signed)
Excuse from Work, School, or Physical Activity ?_______________________________________________________ needs to be excused from: ?____ Work. ?____ School. ?____ Physical activity. ?This is effective for the following dates: ______________________________________. ?He or she may return to work or school, but should avoid physical activity or other activities from now until _______________. ?Activity restrictions include: ?____ Lifting more than _________ lb. ?____ Sitting longer than __________ minutes at a time. ?____ Standing longer than ________ minutes at a time. ?____ Other activities including: ___________________________________________________________________________________ ?____ He or she may return to full physical activity on ________________. ?Health care provider name (printed): _____________________________________________________ ?Health care provider (signature): _________________________________________________________ ?Date: ______________________________ ?This information is not intended to replace advice given to you by your health care provider. Make sure you discuss any questions you have with your health care provider. ?Document Revised: 07/15/2020 Document Reviewed: 01/14/2020 ?Elsevier Patient Education ? 2022 Elsevier Inc. ? ?

## 2021-05-05 NOTE — Progress Notes (Signed)
Diagnosis: Hyperemesis gravidarum with dehydration. ? ?Provider:  Chilton Greathouse, MD ? ?Procedure: Infusion ? ?IV Type: Peripheral, IV Location: R Antecubital ? ?Lactated Ringers, Dose: 1000 ml ? ?Infusion Start Time: 13.17 ? ?Infusion Stop Time: 14.25 ? ?Post Infusion IV Care: Peripheral IV Discontinued ? ?Discharge: Condition: Good, Destination: Home . AVS provided to patient.  ? ?Performed by:  Wilhelmena Zea, RN  ?  ?

## 2021-05-05 NOTE — BH Specialist Note (Signed)
Integrated Behavioral Health via Telemedicine Visit ? ?05/18/2021 ?[name] ?[id number] ? ?Number of Carrie [name] visits: 2- Second Visit ? ?Session Start time: [id number] ?  ?Session End time: [id number] ? ?Total time in minutes: 15 ? ? ?Referring Provider: Clayton Lefort, MD ?Patient/Family location: Home ?[facility] Provider location: [facility] [employer] at [facility] ? ?All persons participating in visit: Patient Carrie [name] and Trainer  ? ?Types of Service: Individual psychotherapy and Telephone visit ? ?I connected with Carrie [name] and/or Carrie [name]'s  n/a  via  Telephone or [employer]  (Video is Tree surgeon) and verified that I am speaking with the correct person using two identifiers. Discussed confidentiality: Yes  ? ?I discussed the limitations of telemedicine and the availability of in person appointments.  Discussed there is a possibility of technology failure and discussed alternative modes of communication if that failure occurs. ? ?I discussed that engaging in this telemedicine visit, they consent to the provision of behavioral healthcare and the services will be billed under their insurance. ? ?Patient and/or legal guardian expressed understanding and consented to Telemedicine visit: Yes  ? ?Presenting Concerns: ?Patient and/or family reports the following symptoms/concerns: Ongoing nausea with stress of getting IV infusion thrice weekly and worry that she's not gaining weight in pregnancy; keeping food down now and taking gummy prenatal vitamin most days; sleep is improving; requests refill of suppository nausea medication. ?Duration of problem: Current pregnancy increase; Severity of problem:  moderately severe ? ?Patient and/or Family's Strengths/Protective Factors: ?Sense of purpose ? ?Goals Addressed: ?Patient will: ? Reduce symptoms of: anxiety and stress  ? Increase knowledge and/or  ability of: healthy habits  ? Demonstrate ability to: Decrease self-medicating behaviors ? ?Progress towards Goals: ?Ongoing ? ?Interventions: ?Interventions utilized:  Functional Assessment of ADLs and Psychoeducation and/or Health Education ?Standardized Assessments completed: Not Needed ? ?Patient and/or Family Response: Patient agrees with treatment plan. ? ? ?Assessment: ?Patient currently experiencing Bipolar 2 disorder, ADHD (both previously diagnosed); Psychosocial stress.  ? ?Patient may benefit from continued brief therapeutic interventions regarding coping with anxiety. ? ?Plan: ?Follow up with behavioral health [name] on : One month; Call Carrie [name] at [phone], as needed. ?Behavioral recommendations:  ?-Continue taking nausea medication as prescribed and attending IV infusion treatments as needed ?-Continue prioritizing healthy self-care daily (healthy sleep; eating foods that don't increase nausea; refraining from unhealthy behaviors for overall wellbeing) ?-Continue plan to attend [facility] after ultrasound appointment  ? ?Referral(s): Natalia (In Clinic) ? ?I discussed the assessment and treatment plan with the patient and/or parent/guardian. They were provided an opportunity to ask questions and all were answered. They agreed with the plan and demonstrated an understanding of the instructions. ?  ?They were advised to call back or seek an in-person evaluation if the symptoms worsen or if the condition fails to improve as anticipated. ? ?Garlan Fair, LCSW ? ? ?  04/15/2021  ?  8:46 AM 04/07/2021  ?  9:53 AM 05/21/2019  ?  9:19 AM 01/24/2019  ?  9:43 AM 04/16/2017  ? 11:51 AM  ?Depression screen PHQ 2/9  ?Decreased Interest 0 1 0 0 0  ?Down, Depressed, Hopeless 0 1 2 3 1   ?PHQ - 2 Score 0 2 2 3 1   ?Altered sleeping 3 3 3 2  0  ?Tired, decreased energy 3 3 3  0 2  ?Change in appetite 3 3 3 3 2   ?Feeling bad or failure about  yourself  0 0 3 2 0  ?Trouble concentrating 0  0 3 0 0  ?Moving slowly or fidgety/restless 0 0 3 0 0  ?Suicidal thoughts 0 0 0 0 0  ?PHQ-9 Score 9 11 20 10 5   ?Difficult doing work/chores    Not difficult at all Somewhat difficult  ? ? ?  04/15/2021  ?  8:46 AM 04/07/2021  ?  9:55 AM 05/21/2019  ?  9:19 AM 01/24/2019  ?  9:46 AM  ?GAD 7 : Generalized Anxiety Score  ?Nervous, Anxious, on Edge 3 0 3 3  ?Control/stop worrying 3 0 3 3  ?Worry too much - different things 3 0 3 3  ?Trouble relaxing 3 3 3 3   ?Restless 3 0 3 0  ?Easily annoyed or irritable 3 3 3 3   ?Afraid - awful might happen 0 0 3 3  ?Total GAD 7 Score 18 6 21 18   ?Anxiety Difficulty    Somewhat difficult  ? ? ? ?

## 2021-05-09 ENCOUNTER — Ambulatory Visit (INDEPENDENT_AMBULATORY_CARE_PROVIDER_SITE_OTHER): Payer: 59

## 2021-05-09 VITALS — BP 114/73 | HR 101 | Temp 98.5°F | Resp 18 | Ht 61.0 in | Wt 165.8 lb

## 2021-05-09 DIAGNOSIS — F12188 Cannabis abuse with other cannabis-induced disorder: Secondary | ICD-10-CM

## 2021-05-09 MED ORDER — PROCHLORPERAZINE EDISYLATE 10 MG/2ML IJ SOLN
INTRAMUSCULAR | Status: AC
  Administered 2021-05-09: 10 mg
  Filled 2021-05-09: qty 2

## 2021-05-09 MED ORDER — LACTATED RINGERS IV BOLUS
1000.0000 mL | Freq: Once | INTRAVENOUS | Status: AC
  Administered 2021-05-09: 1000 mL via INTRAVENOUS

## 2021-05-09 MED ORDER — PROCHLORPERAZINE EDISYLATE 10 MG/2ML IJ SOLN
10.0000 mg | Freq: Once | INTRAMUSCULAR | Status: AC
  Administered 2021-05-09: 10 mg via INTRAVENOUS

## 2021-05-09 NOTE — Progress Notes (Signed)
Diagnosis: Hyperemesis gravidarum with Dehydration ? ?Provider:  Chilton Greathouse, MD ? ?Procedure: Infusion ? ?IV Type: Peripheral, IV Location: R Antecubital ? ?Lactated Ringers, Dose: 1000 ml ? ?Infusion Start Time: 13.09 ? ?Infusion Stop Time: 14.21 ? ?Post Infusion IV Care: Peripheral IV Discontinued ? ?Discharge: Condition: Good, Destination: Home . AVS provided to patient.  ? ?Performed by:  Lachina Salsberry, RN  ?  ?

## 2021-05-11 ENCOUNTER — Ambulatory Visit: Payer: 59 | Admitting: Dermatology

## 2021-05-11 ENCOUNTER — Ambulatory Visit (INDEPENDENT_AMBULATORY_CARE_PROVIDER_SITE_OTHER): Payer: 59

## 2021-05-11 VITALS — BP 115/70 | HR 85 | Temp 97.9°F | Resp 18 | Ht 61.0 in | Wt 168.0 lb

## 2021-05-11 DIAGNOSIS — F12188 Cannabis abuse with other cannabis-induced disorder: Secondary | ICD-10-CM

## 2021-05-11 MED ORDER — PROCHLORPERAZINE EDISYLATE 10 MG/2ML IJ SOLN
10.0000 mg | Freq: Once | INTRAMUSCULAR | Status: AC
  Administered 2021-05-11: 10 mg via INTRAVENOUS
  Filled 2021-05-11: qty 2

## 2021-05-11 MED ORDER — LACTATED RINGERS IV BOLUS
1000.0000 mL | Freq: Once | INTRAVENOUS | Status: AC
  Administered 2021-05-11: 1000 mL via INTRAVENOUS
  Filled 2021-05-11: qty 1000

## 2021-05-11 NOTE — Progress Notes (Signed)
Diagnosis: Hyperemesis gravidarum with Dehydration ? ?Provider:  Chilton Greathouse, MD ? ?Procedure: Infusion ? ?IV Type: Peripheral, IV Location: R Hand ? ?Lactated Ringers, Dose: 1000 ml ? ?Infusion Start Time: 1322 ? ?Infusion Stop Time: 1429 ? ?Post Infusion IV Care: Peripheral IV Discontinued ? ?Discharge: Condition: Good, Destination: Home . AVS provided to patient.  ? ?Performed by:  Adriana Mccallum, RN  ?  ?

## 2021-05-12 ENCOUNTER — Telehealth (INDEPENDENT_AMBULATORY_CARE_PROVIDER_SITE_OTHER): Payer: 59 | Admitting: Obstetrics & Gynecology

## 2021-05-12 ENCOUNTER — Ambulatory Visit (INDEPENDENT_AMBULATORY_CARE_PROVIDER_SITE_OTHER): Payer: 59 | Admitting: Dermatology

## 2021-05-12 ENCOUNTER — Encounter: Payer: Self-pay | Admitting: Obstetrics & Gynecology

## 2021-05-12 DIAGNOSIS — O0991 Supervision of high risk pregnancy, unspecified, first trimester: Secondary | ICD-10-CM

## 2021-05-12 DIAGNOSIS — O10911 Unspecified pre-existing hypertension complicating pregnancy, first trimester: Secondary | ICD-10-CM

## 2021-05-12 DIAGNOSIS — T2109XD Burn of unspecified degree of other site of trunk, subsequent encounter: Secondary | ICD-10-CM | POA: Diagnosis not present

## 2021-05-12 DIAGNOSIS — X110XXD Contact with hot water in bath or tub, subsequent encounter: Secondary | ICD-10-CM | POA: Diagnosis not present

## 2021-05-12 DIAGNOSIS — Z0289 Encounter for other administrative examinations: Secondary | ICD-10-CM

## 2021-05-12 DIAGNOSIS — O0990 Supervision of high risk pregnancy, unspecified, unspecified trimester: Secondary | ICD-10-CM

## 2021-05-12 DIAGNOSIS — T300 Burn of unspecified body region, unspecified degree: Secondary | ICD-10-CM

## 2021-05-12 DIAGNOSIS — O10919 Unspecified pre-existing hypertension complicating pregnancy, unspecified trimester: Secondary | ICD-10-CM

## 2021-05-12 DIAGNOSIS — O219 Vomiting of pregnancy, unspecified: Secondary | ICD-10-CM

## 2021-05-12 DIAGNOSIS — Z3A13 13 weeks gestation of pregnancy: Secondary | ICD-10-CM

## 2021-05-12 MED ORDER — TRIAMCINOLONE ACETONIDE 0.1 % EX OINT: 1.0000 "application " | TOPICAL_OINTMENT | Freq: Every day | CUTANEOUS | 0 refills | Status: DC

## 2021-05-12 NOTE — Patient Instructions (Signed)
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
## 2021-05-12 NOTE — Progress Notes (Signed)
I connected with  Ashley Jacobs on 05/12/21 at  2:55 PM EDT by telephone and verified that I am speaking with the correct person using two identifiers. ?  ?I discussed the limitations, risks, security and privacy concerns of performing an evaluation and management service by telephone and the availability of in person appointments. I also discussed with the patient that there may be a patient responsible charge related to this service. The patient expressed understanding and agreed to proceed. ? ?Guy Begin, CMA ?05/12/2021  2:53 PM  ?

## 2021-05-12 NOTE — Patient Instructions (Signed)
Fax paperwork to [phone]  [facility] ?

## 2021-05-12 NOTE — Progress Notes (Signed)
? ?OBSTETRICS PRENATAL VIRTUAL VISIT ENCOUNTER NOTE ? ?Provider location: Center for [employer] [facility] [employer] [facility]  ? ?Patient location: Home ? ?I connected with Carrie [name] on 05/12/21 at  2:55 PM EDT by MyChart Video Encounter and verified that I am speaking with the correct person using two identifiers. I discussed the limitations, risks, security and privacy concerns of performing an evaluation and management service virtually and the availability of in person appointments. I also discussed with the patient that there may be a patient responsible charge related to this service. The patient expressed understanding and agreed to proceed. ?Subjective:  ?Carrie [name] is a 31 y.o. [phone] at [redacted]w[redacted]d being seen today for ongoing prenatal care.  She is currently monitored for the following issues for this high-risk pregnancy and has Asthma; Vitamin D deficiency disease; Bipolar 2 disorder ([place]); Psychiatric disorder; ADHD; Anxiety; Anemia; Cannabis hyperemesis syndrome concurrent with and due to cannabis abuse ([place]); Low blood potassium; Supervision of high risk pregnancy, antepartum; and Chronic hypertension affecting pregnancy on their problem list. ? ?Patient reports brown vaginal discharge that started roughly 2-3 days ago. Denies any itch or odor.  Was bleeding a couple of weeks ago, no active bleeding.   . Vag. Bleeding: None.  Movement: Absent. Denies any leaking of fluid.  ? ?The following portions of the patient's history were reviewed and updated as appropriate: allergies, current medications, past family history, past medical history, past social history, past surgical history and problem list.  ? ?Objective:  ?There were no vitals filed for this visit. ? ?Fetal Status:     Movement: Absent    ? ?General:  Alert, oriented and cooperative. Patient is in no acute distress.  ?Respiratory: Normal respiratory effort, no problems with respiration noted  ?Mental Status: Normal mood and affect.  Normal behavior. Normal judgment and thought content.  ?Rest of physical exam deferred due to type of encounter ? ?Imaging: ?US OB Comp Less 14 Wks ? ?Result Date: 04/14/2021 ?CLINICAL DATA:  Verify dating. EXAM: OBSTETRIC <14 WK ULTRASOUND TECHNIQUE: Transabdominal ultrasound was performed for evaluation of the gestation as well as the maternal uterus and adnexal regions. COMPARISON:  None. FINDINGS: Intrauterine gestational sac: Single Yolk sac:  Visualized. Embryo:  Visualized. Cardiac Activity: Visualized. Heart Rate: 180 bpm CRL:   25.81 mm   9 w 3 d                  [place] EDC: November 14, 2021 Subchorionic hemorrhage:  None visualized. Maternal uterus/adnexae: A corpus luteum cyst is seen within an otherwise normal appearing right ovary. The left ovary is not visualized secondary to overlying bowel gas. IMPRESSION: Single, viable intrauterine pregnancy at approximately 9 weeks and 3 days gestation by ultrasound evaluation. Electronically Signed   By: Virgina Norfolk M.D.   On: 04/14/2021 21:45  ? ?US OB Transvaginal ? ?Result Date: 05/01/2021 ?CLINICAL DATA:  Vaginal bleeding, pregnancy EXAM: TRANSVAGINAL OB ULTRASOUND TECHNIQUE: Transvaginal ultrasound was performed for complete evaluation of the gestation as well as the maternal uterus, adnexal regions, and pelvic cul-de-sac. COMPARISON:  04/14/2021 FINDINGS: Intrauterine gestational sac: Single Yolk sac:  Visualized. Embryo:  Visualized. Cardiac Activity: Visualized. Heart Rate: 164 bpm CRL:   55.9 mm   12 w 1 d                  [place] EDC: 11/12/2021 Subchorionic hemorrhage:  None visualized. Maternal uterus/adnexae: Bilateral ovaries within normal limits. Cervix is closed measuring approximately 5.0 cm in length. IMPRESSION:  1. Single live intrauterine gestation measuring 12 weeks 1 day by crown-rump length. 2. Active fetal heart tones at 164 BPM. Electronically Signed   By: Davina Poke D.O.   On: 05/01/2021 15:42   ? ?Assessment and Plan:  ?Pregnancy:  [id number] at [redacted]w[redacted]d ?1. Chronic hypertension affecting pregnancy ?Continue Procardia XL for BP control. MFM scans and antenatal testing as per protocol.  ? ?2. Nausea/vomiting in pregnancy ?Continue outpatient IV infusions, antiemetics as needed ? ?3. [redacted] weeks gestation of pregnancy ?4. Supervision of high risk pregnancy, antepartum ?Anatomy scan ordered for patient.  ?No other complaints or concerns.  Routine obstetric precautions reviewed. ?I discussed the assessment and treatment plan with the patient. The patient was provided an opportunity to ask questions and all were answered. The patient agreed with the plan and demonstrated an understanding of the instructions. The patient was advised to call back or seek an in-person office evaluation/go to [facility]U at [facility] for any urgent or concerning symptoms. ?Please refer to After Visit Summary for other counseling recommendations.  ? ?I provided 10 minutes of face-to-face time during this encounter. ? ?Return in about 4 weeks (around 06/09/2021) for OFFICE OB VISIT (MD only). ? ?Future Appointments  ?Date Time Provider [place]  ?05/13/2021  9:00 AM CHINF-CHAIR 2 CH-INFWM None  ?05/18/2021  1:15 PM [place] [facility] [place]  ?06/17/2021  9:30 AM WMC-MFC NURSE WMC-MFC [facility]  ?06/17/2021  9:45 AM WMC-MFC US4 WMC-MFCUS [facility]  ?06/23/2021 11:45 AM Ralene Bathe, MD ASC-ASC None  ? ? ?Verita Schneiders, MD ?Center for [place] ? ?

## 2021-05-13 ENCOUNTER — Encounter: Payer: Self-pay | Admitting: Radiology

## 2021-05-13 ENCOUNTER — Encounter: Payer: Self-pay | Admitting: Dermatology

## 2021-05-16 ENCOUNTER — Ambulatory Visit (INDEPENDENT_AMBULATORY_CARE_PROVIDER_SITE_OTHER): Payer: 59

## 2021-05-16 VITALS — BP 109/74 | HR 66 | Temp 98.3°F | Resp 16 | Ht 61.0 in | Wt 168.0 lb

## 2021-05-16 DIAGNOSIS — F12188 Cannabis abuse with other cannabis-induced disorder: Secondary | ICD-10-CM | POA: Diagnosis not present

## 2021-05-16 MED ORDER — LACTATED RINGERS IV BOLUS
1000.0000 mL | Freq: Once | INTRAVENOUS | Status: AC
  Administered 2021-05-16: 1000 mL via INTRAVENOUS

## 2021-05-16 MED ORDER — PROCHLORPERAZINE EDISYLATE 10 MG/2ML IJ SOLN
10.0000 mg | Freq: Once | INTRAMUSCULAR | Status: AC
  Administered 2021-05-16: 10 mg via INTRAVENOUS
  Filled 2021-05-16: qty 2

## 2021-05-16 NOTE — Progress Notes (Signed)
Diagnosis: Hyperemesis Syndrome ? ?Provider:  Chilton Greathouse, MD ? ?Procedure: Infusion ? ?IV Type: Peripheral, IV Location: R Forearm ? ?Lactated Ringers,  Dose: 1000 ml ? ?Infusion Start Time: 1417 ? ?Infusion Stop Time: 1526 ? ?Post Infusion IV Care: Peripheral IV Discontinued ? ?Discharge: Condition: Good, Destination: Home . AVS provided to patient.  ? ?Performed by:  Astryd Pearcy, RN  ?  ?

## 2021-05-18 ENCOUNTER — Other Ambulatory Visit: Payer: Self-pay | Admitting: Advanced Practice Midwife

## 2021-05-18 ENCOUNTER — Ambulatory Visit (INDEPENDENT_AMBULATORY_CARE_PROVIDER_SITE_OTHER): Payer: 59 | Admitting: Clinical

## 2021-05-18 DIAGNOSIS — F3181 Bipolar II disorder: Secondary | ICD-10-CM

## 2021-05-18 DIAGNOSIS — F908 Attention-deficit hyperactivity disorder, other type: Secondary | ICD-10-CM | POA: Diagnosis not present

## 2021-05-18 DIAGNOSIS — Z658 Other specified problems related to psychosocial circumstances: Secondary | ICD-10-CM

## 2021-05-19 ENCOUNTER — Ambulatory Visit (INDEPENDENT_AMBULATORY_CARE_PROVIDER_SITE_OTHER): Payer: 59

## 2021-05-19 VITALS — BP 114/76 | HR 98 | Temp 98.0°F | Resp 16 | Ht 61.0 in | Wt 167.2 lb

## 2021-05-19 DIAGNOSIS — E860 Dehydration: Secondary | ICD-10-CM | POA: Diagnosis not present

## 2021-05-19 DIAGNOSIS — F12188 Cannabis abuse with other cannabis-induced disorder: Secondary | ICD-10-CM | POA: Diagnosis not present

## 2021-05-19 DIAGNOSIS — O210 Mild hyperemesis gravidarum: Secondary | ICD-10-CM

## 2021-05-19 DIAGNOSIS — Z3A14 14 weeks gestation of pregnancy: Secondary | ICD-10-CM

## 2021-05-19 DIAGNOSIS — O99282 Endocrine, nutritional and metabolic diseases complicating pregnancy, second trimester: Secondary | ICD-10-CM | POA: Diagnosis not present

## 2021-05-19 MED ORDER — PROCHLORPERAZINE EDISYLATE 10 MG/2ML IJ SOLN
10.0000 mg | Freq: Once | INTRAMUSCULAR | Status: AC
  Administered 2021-05-19: 10 mg via INTRAVENOUS
  Filled 2021-05-19: qty 2

## 2021-05-19 MED ORDER — LACTATED RINGERS IV BOLUS
1000.0000 mL | Freq: Once | INTRAVENOUS | Status: AC
  Administered 2021-05-19: 1000 mL via INTRAVENOUS
  Filled 2021-05-19: qty 1000

## 2021-05-19 NOTE — Progress Notes (Signed)
Diagnosis: Hyperemesis/Dehydration ? ?Provider:  Chilton Greathouse, MD ? ?Procedure: Infusion ? ?IV Type: Peripheral, IV Location: L Hand ? ?Lactated Ringers, Dose: 1000 ml ? ?Infusion Start Time: [id number] ? ?Infusion Stop Time: 1057 ? ?Post Infusion IV Care: PIV Discontinued ? ?Discharge: Condition: Good, Destination: Home . AVS provided to patient.  ? ?Performed by:  Marilynn Rail, RN  ?  ?

## 2021-05-20 ENCOUNTER — Encounter: Payer: Self-pay | Admitting: *Deleted

## 2021-05-23 ENCOUNTER — Ambulatory Visit (INDEPENDENT_AMBULATORY_CARE_PROVIDER_SITE_OTHER): Payer: 59

## 2021-05-23 VITALS — BP 113/70 | HR 111 | Temp 98.4°F | Resp 18 | Ht 61.0 in | Wt 171.4 lb

## 2021-05-23 DIAGNOSIS — O210 Mild hyperemesis gravidarum: Secondary | ICD-10-CM | POA: Diagnosis not present

## 2021-05-23 DIAGNOSIS — Z3A15 15 weeks gestation of pregnancy: Secondary | ICD-10-CM | POA: Diagnosis not present

## 2021-05-23 DIAGNOSIS — O99282 Endocrine, nutritional and metabolic diseases complicating pregnancy, second trimester: Secondary | ICD-10-CM

## 2021-05-23 DIAGNOSIS — F12188 Cannabis abuse with other cannabis-induced disorder: Secondary | ICD-10-CM

## 2021-05-23 MED ORDER — LACTATED RINGERS IV BOLUS
1000.0000 mL | Freq: Once | INTRAVENOUS | Status: AC
  Administered 2021-05-23: 1000 mL via INTRAVENOUS

## 2021-05-23 MED ORDER — PROCHLORPERAZINE EDISYLATE 10 MG/2ML IJ SOLN
INTRAMUSCULAR | Status: AC
  Filled 2021-05-23: qty 2

## 2021-05-23 MED ORDER — PROCHLORPERAZINE EDISYLATE 10 MG/2ML IJ SOLN
10.0000 mg | Freq: Once | INTRAMUSCULAR | Status: AC
  Administered 2021-05-23: 10 mg via INTRAVENOUS

## 2021-05-23 NOTE — Progress Notes (Signed)
Diagnosis: Hydration ? ?Provider:  Marshell Garfinkel, MD ? ?Procedure: Infusion ? ?IV Type: Peripheral, IV Location: R Forearm ? ?Lactated Ringers, Dose: 1000 ml ?Compazine 10mg  IV given. ? ?Infusion Start Time: 1410 ? ?Infusion Stop Time: [id number] ?Infusion stopped with about 157ml left in bag. Pt states she not feeling well and wanted to stop infusion. ? ?Post Infusion IV Care: Peripheral IV Discontinued ? ?Discharge: Condition: Good, Destination: Home . AVS declined. ? ?Performed by:  Koren Shiver, RN  ?  ?

## 2021-05-25 ENCOUNTER — Ambulatory Visit: Payer: Self-pay

## 2021-05-26 ENCOUNTER — Ambulatory Visit (INDEPENDENT_AMBULATORY_CARE_PROVIDER_SITE_OTHER): Payer: 59

## 2021-05-26 VITALS — BP 125/83 | HR 107 | Temp 98.0°F | Resp 18 | Ht 61.0 in | Wt 172.0 lb

## 2021-05-26 DIAGNOSIS — O210 Mild hyperemesis gravidarum: Secondary | ICD-10-CM | POA: Diagnosis not present

## 2021-05-26 DIAGNOSIS — Z3A15 15 weeks gestation of pregnancy: Secondary | ICD-10-CM

## 2021-05-26 DIAGNOSIS — F12188 Cannabis abuse with other cannabis-induced disorder: Secondary | ICD-10-CM | POA: Diagnosis not present

## 2021-05-26 MED ORDER — PROCHLORPERAZINE EDISYLATE 10 MG/2ML IJ SOLN: 10.0000 mg | Freq: Once | INTRAMUSCULAR | Status: DC

## 2021-05-26 MED ORDER — LACTATED RINGERS IV BOLUS
1000.0000 mL | Freq: Once | INTRAVENOUS | Status: AC
  Administered 2021-05-26: 1000 mL via INTRAVENOUS
  Filled 2021-05-26: qty 1000

## 2021-05-26 NOTE — Progress Notes (Signed)
Diagnosis: Dehydration ? ?Provider:  Chilton Greathouse, MD ? ?Procedure: Infusion ? ?IV Type: Peripheral, IV Location: R Antecubital ? ?Lactated Ringers, Dose: 1000 ml ? ?Infusion Start Time: 1425 ? ?Infusion Stop Time: 1527 ? ?Post Infusion IV Care: PIV Discontinued ? ?Discharge: Condition: Good, Destination: Home . AVS provided to patient.  ? ?Performed by:  Marilynn Rail, RN  ?  ?

## 2021-05-27 ENCOUNTER — Ambulatory Visit: Payer: Self-pay

## 2021-05-28 ENCOUNTER — Encounter: Payer: Self-pay | Admitting: Radiology

## 2021-05-30 ENCOUNTER — Ambulatory Visit (INDEPENDENT_AMBULATORY_CARE_PROVIDER_SITE_OTHER): Payer: 59

## 2021-05-30 VITALS — BP 113/74 | HR 101 | Temp 97.9°F | Resp 24 | Wt 171.0 lb

## 2021-05-30 DIAGNOSIS — Z3A16 16 weeks gestation of pregnancy: Secondary | ICD-10-CM | POA: Diagnosis not present

## 2021-05-30 DIAGNOSIS — O210 Mild hyperemesis gravidarum: Secondary | ICD-10-CM | POA: Diagnosis not present

## 2021-05-30 DIAGNOSIS — F12188 Cannabis abuse with other cannabis-induced disorder: Secondary | ICD-10-CM | POA: Diagnosis not present

## 2021-05-30 MED ORDER — PROCHLORPERAZINE EDISYLATE 10 MG/2ML IJ SOLN: 10.0000 mg | Freq: Once | INTRAMUSCULAR | Status: DC

## 2021-05-30 MED ORDER — LACTATED RINGERS IV BOLUS
1000.0000 mL | Freq: Once | INTRAVENOUS | Status: AC
  Administered 2021-05-30: 1000 mL via INTRAVENOUS
  Filled 2021-05-30: qty 1000

## 2021-05-30 NOTE — Progress Notes (Signed)
Diagnosis: cannabis hyperemesis, dehydration  ? ?Provider:  Marshell Garfinkel, MD ? ?Procedure: Infusion ? ?IV Type: Peripheral, IV Location: R Forearm ? ?Lactated ringers, Dose: 1000 ml ? ?Infusion Start Time: [id number] ? ?Infusion Stop Time: [id number] ? ?Post Infusion IV Care: Peripheral IV Discontinued ? ?Discharge: Condition: Good, Destination: Home . AVS provided to patient.  ? ?Performed by:  Beryle Flock, RN  ?  ? ?

## 2021-06-01 NOTE — BH Specialist Note (Signed)
Integrated Behavioral Health via Telemedicine Visit ? ?06/15/2021 ?Seleni Meller ?[id number] ? ?Number of Integrated Behavioral Health Clinician visits: 3- Third Visit ? ?Session Start time: 1320 ?  ?Session End time: 1401 ? ?Total time in minutes: 41 ? ? ?Referring Provider: Merian Capron, MD ?Patient/Family location: Home ?[facility] Provider location: Center for [employer] at [facility] ? ?All persons participating in visit: Patient Carrie [name] and [facility] Jahree Dermody  ? ?Types of Service: Individual psychotherapy and Video visit ? ?I connected with Ashley Jacobs and/or Baxter Hire Heeney's  n/a  via  Telephone or [employer]  (Video is Surveyor, mining) and verified that I am speaking with the correct person using two identifiers. Discussed confidentiality: Yes  ? ?I discussed the limitations of telemedicine and the availability of in person appointments.  Discussed there is a possibility of technology failure and discussed alternative modes of communication if that failure occurs. ? ?I discussed that engaging in this telemedicine visit, they consent to the provision of behavioral healthcare and the services will be billed under their insurance. ? ?Patient and/or legal guardian expressed understanding and consented to Telemedicine visit: Yes  ? ?Presenting Concerns: ?Patient and/or family reports the following symptoms/concerns: Increased stress on leave from work with ongoing nausea/sickness; also lower back pain; pt is concerned about losing weight, but able to keep some food down now. Pt requesting resources for help with financial assistance.  ?Duration of problem: Increase current pregnancy; Severity of problem:  moderately severe ? ?Patient and/or Family's Strengths/Protective Factors: ?Concrete supports in place (healthy food, safe environments, etc.) and Sense of purpose ? ?Goals Addressed: ?Patient will: ? Reduce symptoms of: anxiety and  stress  ? Increase knowledge and/or ability of: healthy habits  ? Demonstrate ability to: Increase healthy adjustment to current life circumstances ? ?Progress towards Goals: ?Ongoing ? ?Interventions: ?Interventions utilized:  Psychoeducation and/or Health Education, Link to [employer], and Supportive Reflection ?Standardized Assessments completed: GAD-7 and PHQ 9 ? ?Patient and/or Family Response: Patient agrees with treatment plan. ? ? ?Assessment: ?Patient currently experiencing Bipolar 2 disorder, ADHD (both as previously diagnosed); Psychosocial stress.  ? ?Patient may benefit from continued therapeutic interventions regarding coping with anxiety and life stress. ? ?Plan: ?Follow up with behavioral health clinician on : Two weeks ?Behavioral recommendations:  ?-Continue taking prenatal vitamin as prescribed ?-Continue attending infusion appointments as scheduled ?-Continue prioritizing healthy sleep and routine meals daily, as well as continuing to refrain from unhealthy behaviors ?-Consider additional community resources (on After Visit Summary) ?Referral(s): Integrated Art gallery manager (In Clinic) and [employer] Resources:  [facility] resources ? ?I discussed the assessment and treatment plan with the patient and/or parent/guardian. They were provided an opportunity to ask questions and all were answered. They agreed with the plan and demonstrated an understanding of the instructions. ?  ?They were advised to call back or seek an in-person evaluation if the symptoms worsen or if the condition fails to improve as anticipated. ? ?Rae Lips, LCSW ? ? ?  06/15/2021  ?  1:46 PM 04/15/2021  ?  8:46 AM 04/07/2021  ?  9:53 AM 05/21/2019  ?  9:19 AM 01/24/2019  ?  9:43 AM  ?Depression screen PHQ 2/9  ?Decreased Interest 0 0 1 0 0  ?Down, Depressed, Hopeless 0 0 1 2 3   ?PHQ - 2 Score 0 0 2 2 3   ?Altered sleeping 3 3 3 3 2   ?Tired, decreased energy 3 3 3 3  0  ?Change  in appetite 0 3 3 3 3    ?Feeling bad or failure about yourself  0 0 0 3 2  ?Trouble concentrating 0 0 0 3 0  ?Moving slowly or fidgety/restless 0 0 0 3 0  ?Suicidal thoughts 0 0 0 0 0  ?PHQ-9 Score 6 9 11 20 10   ?Difficult doing work/chores     Not difficult at all  ? ? ?  06/15/2021  ?  1:48 PM 04/15/2021  ?  8:46 AM 04/07/2021  ?  9:55 AM 05/21/2019  ?  9:19 AM  ?GAD 7 : Generalized Anxiety Score  ?Nervous, Anxious, on Edge 0 3 0 3  ?Control/stop worrying 0 3 0 3  ?Worry too much - different things 0 3 0 3  ?Trouble relaxing 0 3 3 3   ?Restless 0 3 0 3  ?Easily annoyed or irritable 1 3 3 3   ?Afraid - awful might happen 0 0 0 3  ?Total GAD 7 Score 1 18 6 21   ? ? ? ? ?

## 2021-06-02 ENCOUNTER — Ambulatory Visit (INDEPENDENT_AMBULATORY_CARE_PROVIDER_SITE_OTHER): Payer: 59

## 2021-06-02 VITALS — BP 93/61 | HR 92 | Temp 97.8°F | Resp 18 | Ht 61.0 in | Wt 168.8 lb

## 2021-06-02 DIAGNOSIS — O210 Mild hyperemesis gravidarum: Secondary | ICD-10-CM | POA: Diagnosis not present

## 2021-06-02 DIAGNOSIS — F12188 Cannabis abuse with other cannabis-induced disorder: Secondary | ICD-10-CM | POA: Diagnosis not present

## 2021-06-02 DIAGNOSIS — Z3A16 16 weeks gestation of pregnancy: Secondary | ICD-10-CM

## 2021-06-02 MED ORDER — LACTATED RINGERS IV BOLUS
1000.0000 mL | Freq: Once | INTRAVENOUS | Status: AC
  Administered 2021-06-02: 1000 mL via INTRAVENOUS
  Filled 2021-06-02: qty 1000

## 2021-06-02 MED ORDER — PROCHLORPERAZINE EDISYLATE 10 MG/2ML IJ SOLN: 10.0000 mg | Freq: Once | INTRAMUSCULAR | Status: DC

## 2021-06-02 NOTE — Progress Notes (Signed)
Diagnosis: Dehydration/Hyperemesis ? ?Provider:  Chilton Greathouse, MD ? ?Procedure: Infusion ? ?IV Type: Peripheral, IV Location: R Hand ? ?Lactated Ringers, Dose: 1000 ml ? ?Infusion Start Time: 1153 ? ?Infusion Stop Time: 1305 ? ?Post Infusion IV Care: Peripheral IV Discontinued ? ?Discharge: Condition: Good, Destination: Home . AVS provided to patient.  ? ?Performed by:  Marilynn Rail, RN  ?  ?

## 2021-06-09 ENCOUNTER — Ambulatory Visit: Payer: 59

## 2021-06-13 ENCOUNTER — Ambulatory Visit: Payer: 59

## 2021-06-15 ENCOUNTER — Ambulatory Visit: Payer: 59 | Admitting: Clinical

## 2021-06-15 DIAGNOSIS — F908 Attention-deficit hyperactivity disorder, other type: Secondary | ICD-10-CM

## 2021-06-15 DIAGNOSIS — F3181 Bipolar II disorder: Secondary | ICD-10-CM

## 2021-06-15 DIAGNOSIS — Z658 Other specified problems related to psychosocial circumstances: Secondary | ICD-10-CM

## 2021-06-15 NOTE — Patient Instructions (Addendum)
Center for [employer] [facility] at [facility] ?[address] ?[place], [place] [address] ?[phone] (main office) ?[phone] (Idalia Allbritton's office) ? ? ?[facility] Department of Social Services ?https://[email or web address].com/dss/ ? ?LIEAP Program ?https://www.[place].com/dss/home/lieap-need-to-know/ ? ?Call "211" for additional resources specific to  [facility] ?                   ?[employer] (serves [place], [place], [place], [place], [place], [place], [place], [place], [place], [place], [place], [place], and [place] counties) ?[address] [place], [place] [address] ?[phone] ?www.https://www.[place].org/  ?**Rental assistance, Home Rehabilitation,Weatherization Assistance Program, Chief Financial Officer, Housing Voucher Program ? ? ? ? ? ? ? ? ? ? ? ?

## 2021-06-16 ENCOUNTER — Encounter: Payer: Self-pay | Admitting: Family Medicine

## 2021-06-17 ENCOUNTER — Ambulatory Visit: Payer: 59 | Attending: Family Medicine

## 2021-06-17 ENCOUNTER — Ambulatory Visit: Payer: 59 | Admitting: *Deleted

## 2021-06-17 ENCOUNTER — Encounter: Payer: Self-pay | Admitting: *Deleted

## 2021-06-17 ENCOUNTER — Other Ambulatory Visit: Payer: Self-pay | Admitting: *Deleted

## 2021-06-17 VITALS — BP 113/64 | HR 86

## 2021-06-17 DIAGNOSIS — O99513 Diseases of the respiratory system complicating pregnancy, third trimester: Secondary | ICD-10-CM | POA: Diagnosis not present

## 2021-06-17 DIAGNOSIS — O0990 Supervision of high risk pregnancy, unspecified, unspecified trimester: Secondary | ICD-10-CM | POA: Diagnosis present

## 2021-06-17 DIAGNOSIS — O10919 Unspecified pre-existing hypertension complicating pregnancy, unspecified trimester: Secondary | ICD-10-CM | POA: Diagnosis present

## 2021-06-17 DIAGNOSIS — Z3A19 19 weeks gestation of pregnancy: Secondary | ICD-10-CM

## 2021-06-17 DIAGNOSIS — O10012 Pre-existing essential hypertension complicating pregnancy, second trimester: Secondary | ICD-10-CM | POA: Diagnosis not present

## 2021-06-17 DIAGNOSIS — J45909 Unspecified asthma, uncomplicated: Secondary | ICD-10-CM

## 2021-06-17 DIAGNOSIS — O10912 Unspecified pre-existing hypertension complicating pregnancy, second trimester: Secondary | ICD-10-CM

## 2021-06-17 DIAGNOSIS — Z363 Encounter for antenatal screening for malformations: Secondary | ICD-10-CM

## 2021-06-18 ENCOUNTER — Encounter: Payer: Self-pay | Admitting: Obstetrics & Gynecology

## 2021-06-20 ENCOUNTER — Encounter: Payer: Self-pay | Admitting: Radiology

## 2021-06-23 ENCOUNTER — Ambulatory Visit: Payer: 59 | Admitting: Dermatology

## 2021-07-22 ENCOUNTER — Encounter: Payer: Self-pay | Admitting: *Deleted

## 2021-07-22 ENCOUNTER — Other Ambulatory Visit: Payer: Self-pay | Admitting: *Deleted

## 2021-07-22 ENCOUNTER — Ambulatory Visit (HOSPITAL_BASED_OUTPATIENT_CLINIC_OR_DEPARTMENT_OTHER): Payer: 59

## 2021-07-22 ENCOUNTER — Ambulatory Visit: Payer: 59 | Attending: Obstetrics | Admitting: *Deleted

## 2021-07-22 ENCOUNTER — Ambulatory Visit: Payer: 59

## 2021-07-22 ENCOUNTER — Ambulatory Visit: Payer: Self-pay

## 2021-07-22 VITALS — BP 114/70 | HR 77

## 2021-07-22 DIAGNOSIS — O99212 Obesity complicating pregnancy, second trimester: Secondary | ICD-10-CM | POA: Insufficient documentation

## 2021-07-22 DIAGNOSIS — O10919 Unspecified pre-existing hypertension complicating pregnancy, unspecified trimester: Secondary | ICD-10-CM

## 2021-07-22 DIAGNOSIS — O0990 Supervision of high risk pregnancy, unspecified, unspecified trimester: Secondary | ICD-10-CM

## 2021-07-22 DIAGNOSIS — Z362 Encounter for other antenatal screening follow-up: Secondary | ICD-10-CM | POA: Insufficient documentation

## 2021-07-22 DIAGNOSIS — O10912 Unspecified pre-existing hypertension complicating pregnancy, second trimester: Secondary | ICD-10-CM | POA: Diagnosis present

## 2021-07-22 DIAGNOSIS — Z3A24 24 weeks gestation of pregnancy: Secondary | ICD-10-CM

## 2021-07-22 DIAGNOSIS — O99512 Diseases of the respiratory system complicating pregnancy, second trimester: Secondary | ICD-10-CM | POA: Insufficient documentation

## 2021-07-22 DIAGNOSIS — J45909 Unspecified asthma, uncomplicated: Secondary | ICD-10-CM | POA: Insufficient documentation

## 2021-07-22 DIAGNOSIS — O10012 Pre-existing essential hypertension complicating pregnancy, second trimester: Secondary | ICD-10-CM

## 2021-07-22 DIAGNOSIS — E669 Obesity, unspecified: Secondary | ICD-10-CM

## 2021-08-19 ENCOUNTER — Ambulatory Visit (INDEPENDENT_AMBULATORY_CARE_PROVIDER_SITE_OTHER): Payer: 59 | Admitting: Certified Nurse Midwife

## 2021-08-19 ENCOUNTER — Ambulatory Visit: Payer: 59 | Attending: Obstetrics

## 2021-08-19 ENCOUNTER — Other Ambulatory Visit: Payer: Self-pay | Admitting: *Deleted

## 2021-08-19 ENCOUNTER — Other Ambulatory Visit: Payer: Self-pay

## 2021-08-19 ENCOUNTER — Ambulatory Visit: Payer: 59 | Admitting: *Deleted

## 2021-08-19 ENCOUNTER — Encounter: Payer: 59 | Admitting: Obstetrics and Gynecology

## 2021-08-19 VITALS — BP 110/70 | HR 70 | Wt 176.0 lb

## 2021-08-19 VITALS — BP 111/70 | HR 84

## 2021-08-19 DIAGNOSIS — E669 Obesity, unspecified: Secondary | ICD-10-CM

## 2021-08-19 DIAGNOSIS — O0932 Supervision of pregnancy with insufficient antenatal care, second trimester: Secondary | ICD-10-CM

## 2021-08-19 DIAGNOSIS — O99323 Drug use complicating pregnancy, third trimester: Secondary | ICD-10-CM | POA: Diagnosis not present

## 2021-08-19 DIAGNOSIS — Z3A28 28 weeks gestation of pregnancy: Secondary | ICD-10-CM | POA: Diagnosis not present

## 2021-08-19 DIAGNOSIS — O99513 Diseases of the respiratory system complicating pregnancy, third trimester: Secondary | ICD-10-CM

## 2021-08-19 DIAGNOSIS — Z3009 Encounter for other general counseling and advice on contraception: Secondary | ICD-10-CM

## 2021-08-19 DIAGNOSIS — O0990 Supervision of high risk pregnancy, unspecified, unspecified trimester: Secondary | ICD-10-CM | POA: Insufficient documentation

## 2021-08-19 DIAGNOSIS — J45909 Unspecified asthma, uncomplicated: Secondary | ICD-10-CM

## 2021-08-19 DIAGNOSIS — Z23 Encounter for immunization: Secondary | ICD-10-CM | POA: Diagnosis not present

## 2021-08-19 DIAGNOSIS — O0993 Supervision of high risk pregnancy, unspecified, third trimester: Secondary | ICD-10-CM | POA: Diagnosis not present

## 2021-08-19 DIAGNOSIS — O10913 Unspecified pre-existing hypertension complicating pregnancy, third trimester: Secondary | ICD-10-CM

## 2021-08-19 DIAGNOSIS — F1210 Cannabis abuse, uncomplicated: Secondary | ICD-10-CM

## 2021-08-19 DIAGNOSIS — N949 Unspecified condition associated with female genital organs and menstrual cycle: Secondary | ICD-10-CM

## 2021-08-19 DIAGNOSIS — O10013 Pre-existing essential hypertension complicating pregnancy, third trimester: Secondary | ICD-10-CM

## 2021-08-19 DIAGNOSIS — O0933 Supervision of pregnancy with insufficient antenatal care, third trimester: Secondary | ICD-10-CM

## 2021-08-19 DIAGNOSIS — O99213 Obesity complicating pregnancy, third trimester: Secondary | ICD-10-CM

## 2021-08-19 DIAGNOSIS — O10919 Unspecified pre-existing hypertension complicating pregnancy, unspecified trimester: Secondary | ICD-10-CM

## 2021-08-19 DIAGNOSIS — F1290 Cannabis use, unspecified, uncomplicated: Secondary | ICD-10-CM

## 2021-08-19 DIAGNOSIS — O0992 Supervision of high risk pregnancy, unspecified, second trimester: Secondary | ICD-10-CM

## 2021-08-19 NOTE — Patient Instructions (Signed)
Please schedule pt for 2 gtt ASAP, please and thank you

## 2021-08-19 NOTE — Progress Notes (Signed)
Patient reports general discomforts of pregnancy: back pain, groin discomforts   Patient informed me that this is her first prenatal appointment since March. She is due for her 28 weeks prenatal labs   Tdap vaccine administered into right deltoid without any complications. All questions and concerns were addressed.   Dawayne Patricia, CMA   08/19/21

## 2021-08-19 NOTE — Progress Notes (Signed)
PRENATAL VISIT NOTE  Subjective:  Carrie [name] is a 31 y.o. [phone] at [redacted]w[redacted]d being seen today for ongoing prenatal care.  She is currently monitored for the following issues for this high-risk pregnancy and has Asthma; Vitamin D deficiency disease; Bipolar 2 disorder (HCC); Psychiatric disorder; ADHD; Anxiety; Anemia; Cannabis hyperemesis syndrome concurrent with and due to cannabis abuse (HCC); Low blood potassium; Supervision of high risk pregnancy, antepartum; and Chronic hypertension affecting pregnancy on their problem list.  Patient reports  fatigue and pelvic pain consistent with Round Ligament pain .  Contractions: Not present. Vag. Bleeding: None.  Movement: Present. Denies leaking of fluid.   The following portions of the patient's history were reviewed and updated as appropriate: allergies, current medications, past family history, past medical history, past social history, past surgical history and problem list.   Objective:   Vitals:   08/19/21 1027  BP: 110/70  Pulse: 70  Weight: 176 lb (79.8 kg)    Fetal Status: Fetal Heart Rate (bpm): 148 Fundal Height: 26 cm Movement: Present     General:  Alert, oriented and cooperative. Patient is in no acute distress.  Skin: Skin is warm and dry. No rash noted.   Cardiovascular: Normal heart rate noted  Respiratory: Normal respiratory effort, no problems with respiration noted  Abdomen: Soft, gravid, appropriate for gestational age.  Pain/Pressure: Present     Pelvic: Cervical exam deferred        Extremities: Normal range of motion.     Mental Status: Normal mood and affect. Normal behavior. Normal judgment and thought content.   Assessment and Plan:  Pregnancy: [id number] at [redacted]w[redacted]d 1.Supervision of high risk pregnancy in second trimester - Patient feeling frequent and vigorous fetal movement.  - HIV Antibody (routine testing w rflx); Future - RPR; Future - CBC; Future - Glucose Tolerance, 2 Hours w/1 Hour; Future - Tdap  vaccine greater than or equal to 7yo IM  2. Chronic hypertension in pregnancy - Currently on Daily Procardia 30mg - BP stable today in the office, continue with management plan.   3. [redacted] weeks gestation of pregnancy -  4. Prenatal care insufficient, second trimester - Patient has been attending , but has not been scheduled for Routine OB visits. Instructions provided on where to go to for scheduling.  - Reiterated new change from monthly to bi-weekly scheduling based on gestation.   5. Supervision of high risk pregnancy, antepartum - Patient being followed by MFM for growth d/t dx of Chronic HTN on medical therapy. Impression: Transverse presentation, Posterior placenta, normal AFI, EFW: 1240g 2#12oz appropriate for gestational age.  - Follow up in 4 weeks for repeat growth.   6. Birth control counseling - Patient undecided on IUD or Nexplanon for PP contraception. Patient counseled and information provided on both .   Preterm labor symptoms and general obstetric precautions including but not limited to vaginal bleeding, contractions, leaking of fluid and fetal movement were reviewed in detail with the patient. Please refer to After Visit Summary for other counseling recommendations.   Return in about 2 weeks (around 09/02/2021) for LOB w GTT and Labs .  Future Appointments  Date Time Provider Department Center  09/08/2021 10:15 AM 11/08/2021 [facility]  09/16/2021  8:15 AM WMC-MFC NURSE [facility]-M[facility]  09/16/2021  8:30 AM WMC-MFC US3 W[facility]  09/23/2021  3:30 PM WMC-MFC NURSE WMC-MFC [facility]  09/23/2021  3:45 PM WMC-MFC US4 [facility]  09/30/2021 11:15 AM WMC-MFC NURSE WMC-M[facility]  09/30/2021  11:30 AM WMC-MFC US3 WMC-[facility]  10/07/2021  3:30 PM WMC-MFC NURSE WMC-MFC [facility]  10/07/2021  3:45 PM WMC-MFC US4 WMC-MFCUS WMC    Eryanna Regal Danella Deis) Suzie Portela, MSN, CNM  Center for [facility]  08/19/21 4:35 PM

## 2021-08-30 ENCOUNTER — Telehealth: Payer: Self-pay | Admitting: General Practice

## 2021-08-30 ENCOUNTER — Encounter: Payer: Self-pay | Admitting: Certified Nurse Midwife

## 2021-08-30 DIAGNOSIS — J45909 Unspecified asthma, uncomplicated: Secondary | ICD-10-CM

## 2021-08-30 MED ORDER — ALBUTEROL SULFATE HFA 108 (90 BASE) MCG/ACT IN AERS: 2.0000 | INHALATION_SPRAY | Freq: Four times a day (QID) | RESPIRATORY_TRACT | 2 refills | Status: AC | PRN

## 2021-08-30 NOTE — Telephone Encounter (Signed)
Called patient regarding mychart message. Asked patient if she has noticed any cold/flu symptoms or noticed chest pain. Patient denies either. Asked patient if the shortness of breath was just with activity. Patient states she gets out of breathe after standing for a short amount of time or walking around. She also notices it with just sitting. Patient states she knows it is her asthma flaring because this is what happens. Discussed with Dr Crissie Reese who is okay with albuterol Rx but need to follow up with patient tomorrow to make sure things are improving. Discussed with patient and advised she go to MAU if shortness of breath worsens despite inhaler use. Patient verbalized understanding.

## 2021-08-31 ENCOUNTER — Telehealth: Payer: Self-pay

## 2021-08-31 NOTE — Telephone Encounter (Addendum)
-----   Message from Carrie Delton, RN sent at 08/30/2021  9:58 AM EDT ----- Regarding: shortness of breath Need to call patient Wednesday July 26 to check on asthma. Need to ensure new Rx for inhaler has improved symptoms per Dr Crissie Reese   Called pt; VM left. MyChart message sent.

## 2021-09-08 ENCOUNTER — Ambulatory Visit (INDEPENDENT_AMBULATORY_CARE_PROVIDER_SITE_OTHER): Payer: 59 | Admitting: Certified Nurse Midwife

## 2021-09-08 ENCOUNTER — Other Ambulatory Visit (HOSPITAL_COMMUNITY)
Admission: RE | Admit: 2021-09-08 | Discharge: 2021-09-08 | Disposition: A | Discharge status: Home / Self Care | Payer: 59 | Source: Ambulatory Visit | Attending: Certified Nurse Midwife | Admitting: Certified Nurse Midwife

## 2021-09-08 ENCOUNTER — Other Ambulatory Visit: Payer: Self-pay

## 2021-09-08 VITALS — BP 107/67 | HR 104 | Wt 175.6 lb

## 2021-09-08 DIAGNOSIS — N898 Other specified noninflammatory disorders of vagina: Secondary | ICD-10-CM

## 2021-09-08 DIAGNOSIS — O26893 Other specified pregnancy related conditions, third trimester: Secondary | ICD-10-CM

## 2021-09-08 DIAGNOSIS — M545 Low back pain, unspecified: Secondary | ICD-10-CM

## 2021-09-08 DIAGNOSIS — Z3A30 30 weeks gestation of pregnancy: Secondary | ICD-10-CM

## 2021-09-08 DIAGNOSIS — O10919 Unspecified pre-existing hypertension complicating pregnancy, unspecified trimester: Secondary | ICD-10-CM

## 2021-09-08 DIAGNOSIS — O0993 Supervision of high risk pregnancy, unspecified, third trimester: Secondary | ICD-10-CM

## 2021-09-08 NOTE — Progress Notes (Signed)
Pt reports white discharge that  Sometimes has an odor & sometimes does not.

## 2021-09-09 ENCOUNTER — Encounter: Payer: Self-pay | Admitting: Certified Nurse Midwife

## 2021-09-09 LAB — CERVICOVAGINAL ANCILLARY ONLY
Bacterial Vaginitis (gardnerella): POSITIVE — AB
Candida Glabrata: NEGATIVE
Candida Vaginitis: POSITIVE — AB
Chlamydia: NEGATIVE
Comment: NEGATIVE
Comment: NEGATIVE
Comment: NEGATIVE
Comment: NEGATIVE
Comment: NEGATIVE
Comment: NORMAL
Neisseria Gonorrhea: NEGATIVE
Trichomonas: NEGATIVE

## 2021-09-09 NOTE — Progress Notes (Signed)
   PRENATAL VISIT NOTE  Subjective:  Carrie [name] is a 31 y.o. [id number] at [redacted]w[redacted]d being seen today for ongoing prenatal care.  She is currently monitored for the following issues for this high-risk pregnancy and has Asthma; Vitamin D deficiency disease; Bipolar 2 disorder (HCC); Psychiatric disorder; ADHD; Anxiety; Anemia; Cannabis hyperemesis syndrome concurrent with and due to cannabis abuse (HCC); Low blood potassium; Supervision of high risk pregnancy, antepartum; and Chronic hypertension affecting pregnancy on their problem list.  Patient reports backache and vaginal discharge .  Contractions: Irritability. Vag. Bleeding: None.  Movement: Present. Denies leaking of fluid.   The following portions of the patient's history were reviewed and updated as appropriate: allergies, current medications, past family history, past medical history, past social history, past surgical history and problem list.   Objective:   Vitals:   09/08/21 1047  BP: 107/67  Pulse: (!) 104  Weight: 175 lb 9.6 oz (79.7 kg)   Fetal Status: Fetal Heart Rate (bpm): 159 Fundal Height: 31 cm Movement: Present     General:  Alert, oriented and cooperative. Patient is in no acute distress.  Skin: Skin is warm and dry. No rash noted.   Cardiovascular: Normal heart rate noted  Respiratory: Normal respiratory effort, no problems with respiration noted  Abdomen: Soft, gravid, appropriate for gestational age.  Pain/Pressure: Present     Pelvic: Cervical exam deferred        Extremities: Normal range of motion.  Edema: None  Mental Status: Normal mood and affect. Normal behavior. Normal judgment and thought content.   Assessment and Plan:  Pregnancy: [id number] at [redacted]w[redacted]d 1. Supervision of high risk pregnancy in third trimester - Doing well, feeling regular and vigorous fetal movement   2. [redacted] weeks gestation of pregnancy - Routine OB care   3. Vaginal discharge during pregnancy in third trimester - Cervicovaginal  ancillary only( )  4. Low back pain during pregnancy in third trimester - Discussed stretches to prevent back pain - Demonstrated proper use of maternity belt   5. Chronic hypertension affecting pregnancy - BP stable on 30 mg nifedipine  Preterm labor symptoms and general obstetric precautions including but not limited to vaginal bleeding, contractions, leaking of fluid and fetal movement were reviewed in detail with the patient. Please refer to After Visit Summary for other counseling recommendations.   Return in about 2 weeks (around 09/22/2021) for IN-PERSON.  Future Appointments  Date Time Provider Department Center  09/16/2021  8:15 AM WMC-MFC NURSE [facility]  09/16/2021  8:30 AM WMC-MFC US3 W[facility]  09/23/2021  9:30 AM WMC-WOCA LAB [facility]  09/23/2021 11:15 AM Carlynn Herald, CNM [facility]  09/23/2021  3:30 PM WMC-MFC NURSE WMC-MFC [facility]  09/23/2021  3:45 PM WMC-MFC US4 WMC-MFCUS [facility]  09/30/2021 11:15 AM WMC-MFC NURSE WMC-M[facility]  09/30/2021 11:30 AM WMC-MFC US3 WMC[facility]  10/07/2021  3:30 PM WMC-MFC NURSE WMC-MFC [facility]  10/07/2021  3:45 PM WMC-MFC US4 WMC-M[facility]  10/14/2021  9:15 AM Carlynn Herald, CNM [facility]  10/19/2021  9:55 AM Bernerd Limbo, CNM [facility]  10/26/2021  1:15 PM Bernerd Limbo, CNM [facility]  11/02/2021 11:15 AM Bernerd Limbo, CNM [facility]  11/09/2021  3:35 PM Bernerd Limbo, CNM [facility]  11/16/2021 10:15 AM Bernerd Limbo, CNM [facility]  11/16/2021 11:15 AM WMC-WOCA NST WMC-CWH WMC    Bernerd Limbo, CNM

## 2021-09-11 MED ORDER — TERCONAZOLE 0.4 % VA CREA: 1.0000 | TOPICAL_CREAM | Freq: Every day | VAGINAL | 0 refills | Status: DC

## 2021-09-11 MED ORDER — METRONIDAZOLE 0.75 % VA GEL: 1.0000 | Freq: Every day | VAGINAL | 1 refills | Status: DC

## 2021-09-11 NOTE — Addendum Note (Signed)
Addended by: Edd Arbour on: 09/11/2021 10:27 AM   Modules accepted: Orders

## 2021-09-13 ENCOUNTER — Telehealth: Payer: Self-pay | Admitting: Family Medicine

## 2021-09-13 DIAGNOSIS — R55 Syncope and collapse: Secondary | ICD-10-CM

## 2021-09-13 DIAGNOSIS — Z3A31 31 weeks gestation of pregnancy: Secondary | ICD-10-CM

## 2021-09-13 NOTE — Telephone Encounter (Signed)
Patient want to know if she can be taken out of work, state she is not able to keep going to work.

## 2021-09-13 NOTE — Telephone Encounter (Signed)
Returned patients call. She did not answer. LM for her to call the office at her convenience and to check her Mychart message.

## 2021-09-14 ENCOUNTER — Inpatient Hospital Stay (HOSPITAL_BASED_OUTPATIENT_CLINIC_OR_DEPARTMENT_OTHER): Payer: 59

## 2021-09-14 ENCOUNTER — Inpatient Hospital Stay (HOSPITAL_COMMUNITY)
Admission: AD | Admit: 2021-09-14 | Discharge: 2021-09-14 | Disposition: A | Discharge status: Home / Self Care | Payer: 59 | Attending: Obstetrics & Gynecology | Admitting: Obstetrics & Gynecology

## 2021-09-14 ENCOUNTER — Encounter (HOSPITAL_COMMUNITY): Payer: Self-pay | Admitting: Obstetrics & Gynecology

## 2021-09-14 DIAGNOSIS — O99323 Drug use complicating pregnancy, third trimester: Secondary | ICD-10-CM | POA: Insufficient documentation

## 2021-09-14 DIAGNOSIS — O10913 Unspecified pre-existing hypertension complicating pregnancy, third trimester: Secondary | ICD-10-CM | POA: Diagnosis not present

## 2021-09-14 DIAGNOSIS — Z3A31 31 weeks gestation of pregnancy: Secondary | ICD-10-CM

## 2021-09-14 DIAGNOSIS — R519 Headache, unspecified: Secondary | ICD-10-CM | POA: Insufficient documentation

## 2021-09-14 DIAGNOSIS — O26893 Other specified pregnancy related conditions, third trimester: Secondary | ICD-10-CM | POA: Diagnosis present

## 2021-09-14 DIAGNOSIS — O99513 Diseases of the respiratory system complicating pregnancy, third trimester: Secondary | ICD-10-CM | POA: Insufficient documentation

## 2021-09-14 DIAGNOSIS — T1490XD Injury, unspecified, subsequent encounter: Secondary | ICD-10-CM | POA: Diagnosis not present

## 2021-09-14 DIAGNOSIS — O99343 Other mental disorders complicating pregnancy, third trimester: Secondary | ICD-10-CM | POA: Insufficient documentation

## 2021-09-14 DIAGNOSIS — W19XXXA Unspecified fall, initial encounter: Secondary | ICD-10-CM | POA: Insufficient documentation

## 2021-09-14 DIAGNOSIS — O9A213 Injury, poisoning and certain other consequences of external causes complicating pregnancy, third trimester: Secondary | ICD-10-CM

## 2021-09-14 LAB — URINALYSIS, ROUTINE W REFLEX MICROSCOPIC
Bacteria, UA: NONE SEEN
Bilirubin Urine: NEGATIVE
Glucose, UA: NEGATIVE mg/dL
Hgb urine dipstick: NEGATIVE
Ketones, ur: NEGATIVE mg/dL
Leukocytes,Ua: NEGATIVE
Nitrite: NEGATIVE
Protein, ur: 30 mg/dL — AB
Specific Gravity, Urine: 1.015 (ref 1.005–1.030)
pH: 7 (ref 5.0–8.0)

## 2021-09-14 LAB — GLUCOSE, CAPILLARY: Glucose-Capillary: 121 mg/dL — ABNORMAL HIGH (ref 70–99)

## 2021-09-14 MED ORDER — METOCLOPRAMIDE HCL 10 MG PO TABS: 10.0000 mg | ORAL_TABLET | Freq: Four times a day (QID) | ORAL | 0 refills | Status: DC

## 2021-09-14 MED ORDER — BUTALBITAL-APAP-CAFFEINE 50-325-40 MG PO TABS
2.0000 | ORAL_TABLET | Freq: Once | ORAL | Status: AC
  Administered 2021-09-14: 2 via ORAL
  Filled 2021-09-14: qty 2

## 2021-09-14 NOTE — MAU Provider Note (Signed)
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 Patient reports she was at work and was arguing on the phone. She states her vision felt like it was closing in on her and the next thing she knew she was on the floor. She is unsure if she hit her abdomen or head. She reports she has a headache.  Patient rates the pain as a 5/10 and has not tried anything for the pain. She denies any abdominal pain. She denies any vaginal bleeding, discharge, and leaking of fluid. Denies any constipation, diarrhea or any urinary complaints. Reports normal fetal movement.  Patient states she ate Cheez-Its and candy today only.   OB History     Gravida  4   Para  2   Term  2   Preterm      AB  1   Living  2      SAB      IAB  1   Ectopic      Multiple  0   Live Births  2           Past Medical History:  Diagnosis Date   Asthma    Bipolar 2 disorder ([place])    Hypertension    Low blood potassium    Psychiatric disorder    Vitamin D deficiency disease     Past Surgical History:  Procedure Laterality Date   TONSILLECTOMY      Family History  Problem Relation Age of Onset   ADD / ADHD Mother        suspected   Healthy Father    Depression Sister    ADD / ADHD Brother    Seizures Son    Mental illness Paternal Uncle        schizophrenia, bipolar, ADHD   Cancer Neg Hx    Diabetes Neg Hx    Heart disease Neg Hx    Hypertension Neg Hx    Stroke Neg Hx    COPD Neg Hx     Social History   Tobacco Use   Smoking status: Never   Smokeless tobacco: Never  Vaping Use   Vaping Use: Never used  Substance Use Topics   Alcohol use: No   Drug use: Not Currently    Types: Marijuana    Comment: month ago     Allergies: No Known Allergies  Medications Prior to Admission  Medication Sig Dispense Refill Last Dose   albuterol (VENTOLIN HFA) 108 (90 Base) MCG/ACT inhaler Inhale 2 puffs into the lungs every 6 (six) hours as needed for wheezing or shortness of breath. 8 g 2 09/14/2021   bacitracin ointment Apply 1 application. topically 3 (three) times daily. (Patient not taking: Reported on 05/12/2021) 120 g 0    famotidine (PEPCID) 20 MG tablet Take 1 tablet (20 mg total) by mouth at bedtime. (Patient not taking: Reported on 05/12/2021) 30 tablet 0    metroNIDAZOLE (METROGEL) 0.75 % vaginal gel Place 1 Applicatorful vaginally at bedtime. Complete this BEFORE the yeast treatment 70 g 1    mupirocin ointment (BACTROBAN) 2 % Apply 1 application. topically daily. Qd to burn on back (Patient not taking:  Reported on 05/12/2021) 44 g 1    NIFEdipine (PROCARDIA-XL/NIFEDICAL-XL) 30 MG 24 hr tablet Take 1 tablet (30 mg total) by mouth daily. 30 tablet 5    Prenatal MV & Min w/FA-DHA (PRENATAL ADULT GUMMY/DHA/FA) 0.4-25 MG CHEW Chew 1 Dose by mouth daily. Daily dose indicated on package. 30 tablet 11    Prenatal Vit-Fe Fumarate-FA (PRETAB) 29-1 MG TABS Take 1 tablet by mouth daily. (Patient not taking: Reported on 08/19/2021) 90 tablet 3    prochlorperazine (COMPAZINE) 10 MG tablet Take 1 tablet (10 mg total) by mouth 3 (three) times daily as needed for nausea or vomiting. (Patient not taking: Reported on 05/12/2021) 30 tablet 3    promethazine (PHENERGAN) 25 MG suppository Place 1 suppository (25 mg total) rectally every 6 (six) hours as needed for nausea or vomiting. (Patient not taking: Reported on 06/17/2021) 15 each 1    promethazine (PHENERGAN) 25 MG tablet Take 1 tablet (25 mg total) by mouth every 6 (six) hours as needed for nausea or vomiting. (Patient not taking: Reported on 05/12/2021) 30 tablet 2    scopolamine (TRANSDERM-SCOP) 1 MG/3DAYS Place 1 patch (1.5 mg total) onto the skin every 3 (three) days. (Patient not  taking: Reported on 05/12/2021) 10 patch 12    terconazole (TERAZOL 7) 0.4 % vaginal cream Place 1 applicator vaginally at bedtime. Begin this AFTER completing the metrogel prescription 45 g 0    triamcinolone ointment (KENALOG) 0.1 % Apply 1 application. topically daily. Qd to burn on back, avoid face, groin, axilla (Patient not taking: Reported on 05/12/2021) 80 g 0     Review of Systems  Constitutional: Negative.  Negative for fatigue and fever.  HENT: Negative.    Respiratory: Negative.  Negative for shortness of breath.   Cardiovascular: Negative.  Negative for chest pain.  Gastrointestinal: Negative.  Negative for abdominal pain, constipation, diarrhea, nausea and vomiting.  Genitourinary: Negative.  Negative for dysuria, vaginal bleeding and vaginal discharge.  Neurological:  Positive for headaches. Negative for dizziness.   Physical Exam   Blood pressure 105/67, pulse 90, temperature (!) 97.4 F (36.3 C), temperature source Oral, resp. rate 17, height 5\' 1"  (1.549 m), weight 80.2 kg, last menstrual period 02/04/2021, SpO2 97 %.  Patient Vitals for the past 24 hrs:  BP Temp Temp src Pulse Resp SpO2 Height Weight  09/14/21 1538 114/75 -- -- 81 -- -- -- --  09/14/21 1115 105/67 (!) 97.4 F (36.3 C) Oral 90 17 97 % -- --  09/14/21 1114 -- -- -- -- -- -- 5\' 1"  (1.549 m) 80.2 kg    Physical Exam Vitals and nursing note reviewed.  Constitutional:      General: She is not in acute distress.    Appearance: She is well-developed.  HENT:     Head: Normocephalic.  Eyes:     Pupils: Pupils are equal, round, and reactive to light.  Cardiovascular:     Rate and Rhythm: Normal rate and regular rhythm.     Heart sounds: Normal heart sounds.  Pulmonary:     Effort: Pulmonary effort is normal. No respiratory distress.     Breath sounds: Normal breath sounds.  Abdominal:     General: Bowel sounds are normal. There is no distension.     Palpations: Abdomen is soft.     Tenderness: There  is no abdominal tenderness.  Skin:    General: Skin is warm and dry.  Neurological:     Mental Status: She is alert and  oriented to person, place, and time.  Psychiatric:        Mood and Affect: Mood normal.        Behavior: Behavior normal.        Thought Content: Thought content normal.        Judgment: Judgment normal.     Fetal Tracing:  Baseline: 130 Variability: moderate Accels: 15x15 Decels: none  Toco: none   MAU Course  Procedures  Results for orders placed or performed during the hospital encounter of 09/14/21 (from the past 24 hour(s))  Urinalysis, Routine w reflex microscopic Urine, Clean Catch     Status: Abnormal   Collection Time: 09/14/21 11:38 AM  Result Value Ref Range   Color, Urine YELLOW YELLOW   APPearance CLEAR CLEAR   Specific Gravity, Urine 1.015 1.005 - 1.030   pH 7.0 5.0 - 8.0   Glucose, UA NEGATIVE NEGATIVE mg/dL   Hgb urine dipstick NEGATIVE NEGATIVE   Bilirubin Urine NEGATIVE NEGATIVE   Ketones, ur NEGATIVE NEGATIVE mg/dL   Protein, ur 30 (A) NEGATIVE mg/dL   Nitrite NEGATIVE NEGATIVE   Leukocytes,Ua NEGATIVE NEGATIVE   RBC / HPF 0-5 0 - 5 RBC/hpf   WBC, UA 0-5 0 - 5 WBC/hpf   Bacteria, UA NONE SEEN NONE SEEN   Squamous Epithelial / LPF 0-5 0 - 5   Mucus PRESENT    Granular Casts, UA PRESENT   Glucose, capillary     Status: Abnormal   Collection Time: 09/14/21 11:58 AM  Result Value Ref Range   Glucose-Capillary 121 (H) 70 - 99 mg/dL     [place] MFM OB Limited  Result Date: 09/14/2021 ----------------------------------------------------------------------  OBSTETRICS REPORT                       (Signed Final 09/14/2021 01:55 pm) ---------------------------------------------------------------------- Patient Info  ID #:       [id number]                          D.O.B.:  10/17/1990 (31 yrs)  Name:       Carrie [name]                 Visit Date: 09/14/2021 12:10 pm ---------------------------------------------------------------------- Performed  By  Attending:        Lin Landsman      Referred By:      [facility] MAU/Triage                    MD  Performed By:     Anabel Halon          [facility] ---------------------------------------------------------------------- Orders  #  Description                           Code        Ordered By  1  [place] MFM OB LIMITED                     63149.70    Loye Vento ----------------------------------------------------------------------  #  Order #                     Accession #                Episode #  1  [id number]                   [id number]                 [id number] ---------------------------------------------------------------------- Indications  Traumatic injury during pregnancy (fall)       O9A.219 T14.90  [redacted] weeks gestation of pregnancy                Z3A.31 ---------------------------------------------------------------------- Fetal Evaluation  Num Of Fetuses:         1  Fetal Heart Rate(bpm):  168  Cardiac Activity:       Observed  Presentation:           Cephalic  Placenta:               Posterior  P. Cord Insertion:      Previously Visualized  Amniotic Fluid  AFI FV:      Within normal limits  AFI Sum(cm)     %Tile       Largest Pocket(cm)  10.35           18          4.94  RUQ(cm)       RLQ(cm)       LUQ(cm)        LLQ(cm)  4.94          4.07          0              1.34  Comment:    No placental abruption or previa identified. ---------------------------------------------------------------------- Biometry  LV:        6.1  mm ---------------------------------------------------------------------- OB History  Gravidity:    4         Term:   2  TOP:          1 ---------------------------------------------------------------------- Gestational Age  LMP:           31w 5d        Date:  02/04/21                 EDD:   11/11/21  Best:          31w 5d     Det. By:  LMP  (02/04/21)          EDD:   11/11/21  ---------------------------------------------------------------------- Anatomy  Cranium:               Appears normal         Stomach:                Appears normal, left                                                                        sided  Ventricles:            Appears normal         Kidneys:  Appear normal  Heart:                 Appears normal         Bladder:                Appears normal                         (4CH, axis, and                         situs)  Diaphragm:             Appears normal ---------------------------------------------------------------------- Cervix Uterus Adnexa  Cervix  Not visualized (advanced GA >24wks)  Uterus  No abnormality visualized.  Right Ovary  Within normal limits.  Left Ovary  Within normal limits.  Cul De Sac  No free fluid seen.  Adnexa  No abnormality visualized. ---------------------------------------------------------------------- Impression  Limited exam due to maternal trauma  Good fetal movement and amniotic fluid volume  No evidence of placenta previa or abruption. ---------------------------------------------------------------------- Recommendations  Continue weekly testing. ----------------------------------------------------------------------              Lin Landsman, MD Electronically Signed Final Report   09/14/2021 01:55 pm ----------------------------------------------------------------------    MDM Labs ordered and reviewed.   4 hours of monitoring post fall- reactive with no uterine activity Fioricet- complete resolution of headache [place] MFM OB Limited  CNM independently reviewed the imaging ordered. Imaging show normal AFI with no evidence of abruption  CNM consulted with Dr. Despina Hidden regarding presentation and results- MD recommends 4 hours of monitoring and discharge home if no uterine activity  Assessment and Plan   1. Fall, initial encounter   2. [redacted] weeks gestation of pregnancy    -Discharge home in stable  condition -Nutrition in pregnancy precautions discussed -Patient advised to follow-up with OB as scheduled for prenatal care -Patient may return to MAU as needed or if her condition were to change or worsen  Rolm Bookbinder, CNM 09/14/2021, 11:41 AM

## 2021-09-14 NOTE — MAU Note (Signed)
...  Carrie [name] is a 31 y.o. at [redacted]w[redacted]d here in MAU reporting: Larey Seat at work around [phone]. She reports her vision became blurry initially and then the next thing she remembers is waking up on the floor. She reports she was lying on her left side. She is unsure if she hit her stomach or her head. She reports she is experiencing pain in between her eyes and it radiates into her left eye. She reports she was on the phone with her ex husband and they were "going back and forth" and "the next thing I know is my vision started closing in. Has not been to the restroom so unsure if she bleeding. Endorses occasional dizziness with standing. Denies feeling LOF. DFM. Has not felt baby since 0400.  Has not taken any medications today.  Last PO: Cheese It's and Candy around 0945. Strawberry milk around 0845.  Onset of complaint: Today at 1030. Pain score:  7/10 HA - in between eyes and radiates into left eye. 8/10 lower abdomen - pressure for three weeks. 10/10 lower back - not a new complaint. She reports she has been consistently making her OB aware.  Vitals:   09/14/21 1115  BP: 105/67  Pulse: 90  Resp: 17  Temp: (!) 97.4 F (36.3 C)  SpO2: 97%     FHT: 136 initial external Lab orders placed from triage: UA

## 2021-09-14 NOTE — Discharge Instructions (Signed)

## 2021-09-15 ENCOUNTER — Encounter: Payer: Self-pay | Admitting: Certified Nurse Midwife

## 2021-09-15 NOTE — Addendum Note (Signed)
Addended by: Carlynn Herald on: 09/15/2021 04:40 PM   Modules accepted: Orders

## 2021-09-16 ENCOUNTER — Other Ambulatory Visit: Payer: Self-pay | Admitting: *Deleted

## 2021-09-16 ENCOUNTER — Ambulatory Visit: Payer: 59 | Attending: Obstetrics and Gynecology

## 2021-09-16 ENCOUNTER — Ambulatory Visit: Payer: 59 | Admitting: *Deleted

## 2021-09-16 VITALS — BP 95/65 | HR 106

## 2021-09-16 DIAGNOSIS — O10913 Unspecified pre-existing hypertension complicating pregnancy, third trimester: Secondary | ICD-10-CM | POA: Diagnosis present

## 2021-09-16 DIAGNOSIS — Z3A32 32 weeks gestation of pregnancy: Secondary | ICD-10-CM

## 2021-09-16 DIAGNOSIS — O0990 Supervision of high risk pregnancy, unspecified, unspecified trimester: Secondary | ICD-10-CM | POA: Diagnosis present

## 2021-09-16 DIAGNOSIS — J45909 Unspecified asthma, uncomplicated: Secondary | ICD-10-CM | POA: Insufficient documentation

## 2021-09-16 DIAGNOSIS — F1210 Cannabis abuse, uncomplicated: Secondary | ICD-10-CM | POA: Diagnosis present

## 2021-09-16 DIAGNOSIS — O99323 Drug use complicating pregnancy, third trimester: Secondary | ICD-10-CM

## 2021-09-16 DIAGNOSIS — O99513 Diseases of the respiratory system complicating pregnancy, third trimester: Secondary | ICD-10-CM | POA: Insufficient documentation

## 2021-09-16 DIAGNOSIS — O10013 Pre-existing essential hypertension complicating pregnancy, third trimester: Secondary | ICD-10-CM | POA: Diagnosis not present

## 2021-09-16 DIAGNOSIS — O99213 Obesity complicating pregnancy, third trimester: Secondary | ICD-10-CM | POA: Insufficient documentation

## 2021-09-16 DIAGNOSIS — E669 Obesity, unspecified: Secondary | ICD-10-CM

## 2021-09-16 DIAGNOSIS — F1290 Cannabis use, unspecified, uncomplicated: Secondary | ICD-10-CM

## 2021-09-23 ENCOUNTER — Other Ambulatory Visit: Payer: 59

## 2021-09-23 ENCOUNTER — Ambulatory Visit: Payer: 59 | Attending: Obstetrics and Gynecology

## 2021-09-23 ENCOUNTER — Ambulatory Visit (INDEPENDENT_AMBULATORY_CARE_PROVIDER_SITE_OTHER): Payer: 59 | Admitting: Certified Nurse Midwife

## 2021-09-23 ENCOUNTER — Ambulatory Visit: Payer: 59 | Admitting: *Deleted

## 2021-09-23 ENCOUNTER — Other Ambulatory Visit: Payer: Self-pay

## 2021-09-23 VITALS — BP 102/60 | HR 87

## 2021-09-23 VITALS — BP 102/53 | HR 104 | Wt 178.0 lb

## 2021-09-23 DIAGNOSIS — O10919 Unspecified pre-existing hypertension complicating pregnancy, unspecified trimester: Secondary | ICD-10-CM

## 2021-09-23 DIAGNOSIS — O0993 Supervision of high risk pregnancy, unspecified, third trimester: Secondary | ICD-10-CM

## 2021-09-23 DIAGNOSIS — O0990 Supervision of high risk pregnancy, unspecified, unspecified trimester: Secondary | ICD-10-CM

## 2021-09-23 DIAGNOSIS — O99519 Diseases of the respiratory system complicating pregnancy, unspecified trimester: Secondary | ICD-10-CM

## 2021-09-23 DIAGNOSIS — F1210 Cannabis abuse, uncomplicated: Secondary | ICD-10-CM | POA: Insufficient documentation

## 2021-09-23 DIAGNOSIS — Z3A32 32 weeks gestation of pregnancy: Secondary | ICD-10-CM

## 2021-09-23 DIAGNOSIS — O99213 Obesity complicating pregnancy, third trimester: Secondary | ICD-10-CM

## 2021-09-23 DIAGNOSIS — J45909 Unspecified asthma, uncomplicated: Secondary | ICD-10-CM | POA: Diagnosis not present

## 2021-09-23 DIAGNOSIS — O99323 Drug use complicating pregnancy, third trimester: Secondary | ICD-10-CM | POA: Diagnosis not present

## 2021-09-23 DIAGNOSIS — Z3A33 33 weeks gestation of pregnancy: Secondary | ICD-10-CM

## 2021-09-23 DIAGNOSIS — O10913 Unspecified pre-existing hypertension complicating pregnancy, third trimester: Secondary | ICD-10-CM | POA: Insufficient documentation

## 2021-09-23 DIAGNOSIS — O10013 Pre-existing essential hypertension complicating pregnancy, third trimester: Secondary | ICD-10-CM

## 2021-09-23 DIAGNOSIS — O99513 Diseases of the respiratory system complicating pregnancy, third trimester: Secondary | ICD-10-CM | POA: Diagnosis not present

## 2021-09-23 DIAGNOSIS — E669 Obesity, unspecified: Secondary | ICD-10-CM

## 2021-09-23 DIAGNOSIS — F1290 Cannabis use, unspecified, uncomplicated: Secondary | ICD-10-CM

## 2021-09-23 DIAGNOSIS — Z3A28 28 weeks gestation of pregnancy: Secondary | ICD-10-CM

## 2021-09-23 DIAGNOSIS — R55 Syncope and collapse: Secondary | ICD-10-CM

## 2021-09-23 MED ORDER — FLUTICASONE-SALMETEROL 45-21 MCG/ACT IN AERO: 2.0000 | INHALATION_SPRAY | Freq: Two times a day (BID) | RESPIRATORY_TRACT | 12 refills | Status: AC

## 2021-09-23 NOTE — Progress Notes (Signed)
PRENATAL VISIT NOTE  Subjective:  Carrie [name] is a 31 y.o. [phone] at [redacted]w[redacted]d being seen today for ongoing prenatal care.  She is currently monitored for the following issues for this low-risk pregnancy and has Asthma; Vitamin D deficiency disease; Bipolar 2 disorder (HCC); Psychiatric disorder; ADHD; Anxiety; Anemia; Cannabis hyperemesis syndrome concurrent with and due to cannabis abuse (HCC); Low blood potassium; Supervision of high risk pregnancy, antepartum; and Chronic hypertension affecting pregnancy on their problem list.  Patient reports  trouble breathing and has been using her rescue inhaler daily for relief.   Denies repeat syncopal episodes, but states she gets light headed and "feels like she is going to pass out".  Denies Pre E signs and symptoms. Endorses vaginal pressure..  Contractions: Not present. Vag. Bleeding: None.  Movement: Present. Denies leaking of fluid.   The following portions of the patient's history were reviewed and updated as appropriate: allergies, current medications, past family history, past medical history, past social history, past surgical history and problem list.   Objective:   Vitals:   09/23/21 1024  BP: (!) 102/53  Pulse: (!) 104  Weight: 178 lb (80.7 kg)    Fetal Status: Fetal Heart Rate (bpm): 135   Movement: Present     General:  Alert, oriented and cooperative. Patient is in no acute distress.  Skin: Skin is warm and dry. No rash noted.   Cardiovascular: Normal heart rate noted  Respiratory: Normal respiratory effort, no problems with respiration noted  Abdomen: Soft, gravid, appropriate for gestational age.  Pain/Pressure: Present     Pelvic: Cervical exam deferred        Extremities: Normal range of motion.  Edema: None  Mental Status: Normal mood and affect. Normal behavior. Normal judgment and thought content.   Assessment and Plan:  Pregnancy: [id number] at [redacted]w[redacted]d 1. Supervision of high risk pregnancy in third trimester -  Patient feeling frequent and vigorous fetal movement.  - Patient request Elective IOL at 39 weeks if possible. Briefly discussed IOL options during visit.   2. Syncope, unspecified syncope type - Has only had 1 episode. Fell at work.  - Referral placed for [facility]. Looks like they reached out and left a message.  - Recommendation to call and make an appointment ASAP   3. Asthma affecting pregnancy, antepartum - Steroid inhaler prescribed to use in conjunction with rescue inhaler.  - Rx sent to outpatient pharmacy.   4. Chronic hypertension in pregnancy - Taking Procardia 30mg  XL "as needed" per patient. Has not taken today. BPs low/normal.  - Encouraged to watch BPs, with medication, may be contributing to Syncopal episodes.   5. [redacted] weeks gestation of pregnancy - 3 hr GTT collected today - Follow- up today at 3pm.  - Plan for GBS collection at next visit.   6. Supervision of high risk pregnancy, antepartum - Plan to schedule eIOL at next visit.   Preterm labor symptoms and general obstetric precautions including but not limited to vaginal bleeding, contractions, leaking of fluid and fetal movement were reviewed in detail with the patient. Please refer to After Visit Summary for other counseling recommendations.   Return in about 2 weeks (around 10/07/2021).  Future Appointments  Date Time Provider Department Center  09/23/2021  3:30 PM 09/25/2021, RN [facility]  09/23/2021  3:45 PM [facility] US4 [facility]  09/30/2021 11:15 AM [facility] NURSE [facility]  09/30/2021 11:30 AM [facility] US3 [facility]  10/07/2021  3:30 PM [facility] NURSE  [facility]  10/07/2021  3:45 PM [facility] US4 [facility]  10/14/2021  9:15 AM Carlynn Herald, CNM [facility]  10/14/2021  2:45 PM [facility] NURSE [facility]  10/14/2021  3:00 PM [facility] US1 [facility]  10/19/2021  9:55 AM Bernerd Limbo, CNM [facility]  10/21/2021  3:15 PM [facility] NURSE [facility]  10/21/2021  3:30 PM [facility] US2  [facility]  10/26/2021  1:15 PM Bernerd Limbo, CNM [facility]  11/02/2021 11:15 AM Bernerd Limbo, CNM [facility]  11/09/2021  3:35 PM Bernerd Limbo, CNM [facility]  11/16/2021 10:15 AM Bernerd Limbo, CNM [facility]  11/16/2021 11:15 AM [facility] NST [facility]    Aksel Bencomo Dorathy Daft, MSN, CNM  [facility]  09/23/21 1:29 PM

## 2021-09-24 ENCOUNTER — Encounter: Payer: Self-pay | Admitting: Certified Nurse Midwife

## 2021-09-24 ENCOUNTER — Other Ambulatory Visit: Payer: Self-pay | Admitting: Certified Nurse Midwife

## 2021-09-24 LAB — CBC
Hematocrit: 26.3 % — ABNORMAL LOW (ref 34.0–46.6)
Hemoglobin: 8.7 g/dL — ABNORMAL LOW (ref 11.1–15.9)
MCH: 25.7 pg — ABNORMAL LOW (ref 26.6–33.0)
MCHC: 33.1 g/dL (ref 31.5–35.7)
MCV: 78 fL — ABNORMAL LOW (ref 79–97)
Platelets: 273 10*3/uL (ref 150–450)
RBC: 3.38 x10E6/uL — ABNORMAL LOW (ref 3.77–5.28)
RDW: 13.6 % (ref 11.7–15.4)
WBC: 5.6 10*3/uL (ref 3.4–10.8)

## 2021-09-24 LAB — GLUCOSE TOLERANCE, 2 HOURS W/ 1HR
Glucose, 1 hour: 151 mg/dL (ref 70–179)
Glucose, 2 hour: 104 mg/dL (ref 70–152)
Glucose, Fasting: 79 mg/dL (ref 70–91)

## 2021-09-24 LAB — HIV ANTIBODY (ROUTINE TESTING W REFLEX): HIV Screen 4th Generation wRfx: NONREACTIVE

## 2021-09-24 LAB — RPR: RPR Ser Ql: NONREACTIVE

## 2021-09-29 ENCOUNTER — Encounter: Payer: Self-pay | Admitting: *Deleted

## 2021-09-29 ENCOUNTER — Encounter (HOSPITAL_COMMUNITY): Payer: 59

## 2021-09-30 ENCOUNTER — Ambulatory Visit: Payer: 59 | Admitting: *Deleted

## 2021-09-30 ENCOUNTER — Ambulatory Visit: Payer: 59 | Attending: Obstetrics and Gynecology

## 2021-09-30 ENCOUNTER — Other Ambulatory Visit: Payer: Self-pay | Admitting: Maternal & Fetal Medicine

## 2021-09-30 VITALS — BP 112/73 | HR 77

## 2021-09-30 DIAGNOSIS — O99323 Drug use complicating pregnancy, third trimester: Secondary | ICD-10-CM

## 2021-09-30 DIAGNOSIS — O10913 Unspecified pre-existing hypertension complicating pregnancy, third trimester: Secondary | ICD-10-CM | POA: Diagnosis present

## 2021-09-30 DIAGNOSIS — O99213 Obesity complicating pregnancy, third trimester: Secondary | ICD-10-CM | POA: Diagnosis present

## 2021-09-30 DIAGNOSIS — J45909 Unspecified asthma, uncomplicated: Secondary | ICD-10-CM

## 2021-09-30 DIAGNOSIS — F1290 Cannabis use, unspecified, uncomplicated: Secondary | ICD-10-CM

## 2021-09-30 DIAGNOSIS — O10013 Pre-existing essential hypertension complicating pregnancy, third trimester: Secondary | ICD-10-CM | POA: Diagnosis not present

## 2021-09-30 DIAGNOSIS — O0990 Supervision of high risk pregnancy, unspecified, unspecified trimester: Secondary | ICD-10-CM | POA: Insufficient documentation

## 2021-09-30 DIAGNOSIS — F1210 Cannabis abuse, uncomplicated: Secondary | ICD-10-CM

## 2021-09-30 DIAGNOSIS — O99513 Diseases of the respiratory system complicating pregnancy, third trimester: Secondary | ICD-10-CM | POA: Insufficient documentation

## 2021-09-30 DIAGNOSIS — Z3A34 34 weeks gestation of pregnancy: Secondary | ICD-10-CM

## 2021-09-30 DIAGNOSIS — E669 Obesity, unspecified: Secondary | ICD-10-CM

## 2021-09-30 NOTE — Procedures (Signed)
Carrie [name] January 28, 1991 [redacted]w[redacted]d  Fetus A Non-Stress Test Interpretation for 09/30/21  Indication: Unsatisfactory BPP  Fetal Heart Rate A Mode: External Baseline Rate (A): 130 bpm Variability: Moderate Accelerations: 15 x 15 Decelerations: Variable Multiple birth?: No  Uterine Activity Mode: Palpation, Toco Contraction Frequency (min): none Resting Tone Palpated: Relaxed  Interpretation (Fetal Testing) Nonstress Test Interpretation: Reactive Overall Impression: Reassuring for gestational age Comments: Dr. Parke Poisson reviewed tracing

## 2021-10-04 ENCOUNTER — Encounter (HOSPITAL_COMMUNITY): Payer: 59

## 2021-10-07 ENCOUNTER — Ambulatory Visit: Payer: 59 | Admitting: *Deleted

## 2021-10-07 ENCOUNTER — Ambulatory Visit: Payer: 59 | Attending: Obstetrics and Gynecology

## 2021-10-07 DIAGNOSIS — O0990 Supervision of high risk pregnancy, unspecified, unspecified trimester: Secondary | ICD-10-CM | POA: Diagnosis present

## 2021-10-07 DIAGNOSIS — O10913 Unspecified pre-existing hypertension complicating pregnancy, third trimester: Secondary | ICD-10-CM

## 2021-10-07 DIAGNOSIS — O99213 Obesity complicating pregnancy, third trimester: Secondary | ICD-10-CM | POA: Diagnosis not present

## 2021-10-07 DIAGNOSIS — Z3A35 35 weeks gestation of pregnancy: Secondary | ICD-10-CM

## 2021-10-07 DIAGNOSIS — J45909 Unspecified asthma, uncomplicated: Secondary | ICD-10-CM

## 2021-10-07 DIAGNOSIS — O99513 Diseases of the respiratory system complicating pregnancy, third trimester: Secondary | ICD-10-CM | POA: Diagnosis not present

## 2021-10-07 DIAGNOSIS — F1210 Cannabis abuse, uncomplicated: Secondary | ICD-10-CM

## 2021-10-07 DIAGNOSIS — E669 Obesity, unspecified: Secondary | ICD-10-CM

## 2021-10-07 DIAGNOSIS — O99323 Drug use complicating pregnancy, third trimester: Secondary | ICD-10-CM

## 2021-10-07 DIAGNOSIS — O10013 Pre-existing essential hypertension complicating pregnancy, third trimester: Secondary | ICD-10-CM | POA: Diagnosis not present

## 2021-10-13 DIAGNOSIS — O99019 Anemia complicating pregnancy, unspecified trimester: Secondary | ICD-10-CM | POA: Insufficient documentation

## 2021-10-13 DIAGNOSIS — R55 Syncope and collapse: Secondary | ICD-10-CM | POA: Insufficient documentation

## 2021-10-14 ENCOUNTER — Other Ambulatory Visit: Payer: Self-pay | Admitting: Obstetrics

## 2021-10-14 ENCOUNTER — Ambulatory Visit (HOSPITAL_BASED_OUTPATIENT_CLINIC_OR_DEPARTMENT_OTHER): Payer: 59

## 2021-10-14 ENCOUNTER — Other Ambulatory Visit: Payer: Self-pay

## 2021-10-14 ENCOUNTER — Ambulatory Visit: Payer: 59 | Admitting: *Deleted

## 2021-10-14 ENCOUNTER — Ambulatory Visit (INDEPENDENT_AMBULATORY_CARE_PROVIDER_SITE_OTHER): Payer: 59 | Admitting: Certified Nurse Midwife

## 2021-10-14 ENCOUNTER — Other Ambulatory Visit
Admission: RE | Admit: 2021-10-14 | Discharge: 2021-10-14 | Disposition: A | Discharge status: Home / Self Care | Payer: 59 | Source: Ambulatory Visit | Attending: Certified Nurse Midwife | Admitting: Certified Nurse Midwife

## 2021-10-14 VITALS — BP 104/76 | HR 115

## 2021-10-14 DIAGNOSIS — Z3A36 36 weeks gestation of pregnancy: Secondary | ICD-10-CM

## 2021-10-14 DIAGNOSIS — O0993 Supervision of high risk pregnancy, unspecified, third trimester: Secondary | ICD-10-CM | POA: Diagnosis present

## 2021-10-14 DIAGNOSIS — J45909 Unspecified asthma, uncomplicated: Secondary | ICD-10-CM

## 2021-10-14 DIAGNOSIS — O0990 Supervision of high risk pregnancy, unspecified, unspecified trimester: Secondary | ICD-10-CM | POA: Insufficient documentation

## 2021-10-14 DIAGNOSIS — O99513 Diseases of the respiratory system complicating pregnancy, third trimester: Secondary | ICD-10-CM

## 2021-10-14 DIAGNOSIS — O10913 Unspecified pre-existing hypertension complicating pregnancy, third trimester: Secondary | ICD-10-CM

## 2021-10-14 DIAGNOSIS — R55 Syncope and collapse: Secondary | ICD-10-CM | POA: Diagnosis present

## 2021-10-14 DIAGNOSIS — E669 Obesity, unspecified: Secondary | ICD-10-CM

## 2021-10-14 DIAGNOSIS — O99013 Anemia complicating pregnancy, third trimester: Secondary | ICD-10-CM

## 2021-10-14 DIAGNOSIS — O99213 Obesity complicating pregnancy, third trimester: Secondary | ICD-10-CM

## 2021-10-14 DIAGNOSIS — O10013 Pre-existing essential hypertension complicating pregnancy, third trimester: Secondary | ICD-10-CM | POA: Diagnosis not present

## 2021-10-14 NOTE — Patient Instructions (Signed)
Things to Try After 37 weeks to Encourage Labor/Get Ready for Labor:    Try the Miles Circuit at www.milescircuit.com daily to improve baby's position and encourage the onset of labor.  Walk a little and rest a little every day.  Change positions often.  Cervical Ripening: May try one or both Red Raspberry Leaf capsules or tea:  two 300mg or 400mg tablets with each meal, 2-3 times a day, or 1-3 cups of tea daily  Potential Side Effects Of Raspberry Leaf:  Most women do not experience any side effects from drinking raspberry leaf tea. However, nausea and loose stools are possible   Evening Primrose Oil capsules: take 1 capsule by mouth and place one capsule in the vagina every night.    Some of the potential side effects:  Upset stomach  Loose stools or diarrhea  Headaches  Nausea  Sex can also help the cervix ripen and encourage labor onset.   

## 2021-10-15 NOTE — Progress Notes (Signed)
PRENATAL VISIT NOTE  Subjective:  Carrie [name] is a 31 y.o. [id number] at [redacted]w[redacted]d being seen today for ongoing prenatal care.  She is currently monitored for the following issues for this high-risk pregnancy and has Asthma; Vitamin D deficiency disease; Bipolar 2 disorder (HCC); Psychiatric disorder; ADHD; Anxiety; Anemia; Cannabis hyperemesis syndrome concurrent with and due to cannabis abuse (HCC); Low blood potassium; Supervision of high risk pregnancy, antepartum; Chronic hypertension affecting pregnancy; Syncopal episodes; and Anemia in pregnancy on their problem list.  Patient reports  an increase in vaginal discharge that started yesterday. Noted that it was "watery with white stuff in it." States she had to wear a pad yesterday for a few hours, but has not had discharge today. Denies itching, burning, or other vaginal irritations..   She also complains of fatigue and being "tired all the time."  Contractions: Irritability. Vag. Bleeding: None.  Movement: Present. Denies leaking of fluid.   The following portions of the patient's history were reviewed and updated as appropriate: allergies, current medications, past family history, past medical history, past social history, past surgical history and problem list.   Objective:   Vitals:   10/14/21 0936  BP: 126/81  Pulse: 98  Weight: 179 lb 12.8 oz (81.6 kg)    Fetal Status: Fetal Heart Rate (bpm): 160 Fundal Height: 35 cm Movement: Present     General:  Alert, oriented and cooperative. Patient is in no acute distress.  Skin: Skin is warm and dry. No rash noted.   Cardiovascular: Normal heart rate noted  Respiratory: Normal respiratory effort, no problems with respiration noted  Abdomen: Soft, gravid, appropriate for gestational age.  Pain/Pressure: Present     Pelvic: Cervical exam performed in the presence of a chaperone Dilation: 2 Effacement (%): Thick Station: -3  Extremities: Normal range of motion.  Edema: None  Mental  Status: Normal mood and affect. Normal behavior. Normal judgment and thought content.   Assessment and Plan:  Pregnancy: [id number] at [redacted]w[redacted]d 1. Supervision of high risk pregnancy in third trimester - Patient feeling frequent and vigorous fetal movement - Culture, beta strep (group b only) - Cervicovaginal ancillary only( [place])  2. Asthma affecting pregnancy in third trimester - Since adding on Steroid inhaler patient notes improvement. Recommended to continue to use as directed.    3. Anemia during pregnancy in third trimester - 3 weeks ago Hbg 8.7.  - Patient complains of fatigue.  - CNM discussed anemia in pregnancy vs normal discomforts of pregnancy and how low Hgb can be a contributing factor. Stressed the importance of going to scheduled appointments.  - Iron infusion attempting to be rescheduled.   4. Syncope, unspecified syncope type - Patient denies anymore syncopal episodes.  - Encouraged to report to MAU if happens again.  - Patient states she going to call and reschedule her OB-Cardio appointment today.   5. [redacted] weeks gestation of pregnancy - Cervical exam today per patients request was 2/thick/-3 S. Suzie Portela, CNM. No overt signs of SROM today in office. Normal pregnancy vaginal discharge observed.  - Patient request eIOL at 39 weeks. Informed patient that d/t scheduling constraints unable to finalize dating for IOL, but will continue to check in for dates. Patient requests IOL for 11/06/21.  -Encouraged patient to report to MAU if vaginal discharge. continues today.   Preterm labor symptoms and general obstetric precautions including but not limited to vaginal bleeding, contractions, leaking of fluid and fetal movement were reviewed in detail with the patient. Please  refer to After Visit Summary for other counseling recommendations.   Return in about 1 week (around 10/21/2021) for LOB.  Future Appointments  Date Time Provider Department Center  10/19/2021  9:55 AM Bernerd Limbo, CNM [facility]  10/21/2021  3:15 PM [facility] NURSE [facility]  10/21/2021  3:30 PM [facility] US2 [facility]  10/26/2021  1:15 PM Bernerd Limbo, CNM [facility]  10/28/2021  9:30 AM [facility] NURSE [facility]  10/28/2021  9:45 AM [facility] US7 [facility]  11/02/2021 11:15 AM Osborne Oman [facility]  11/09/2021  3:35 PM Bernerd Limbo, CNM [facility]  11/16/2021 10:15 AM Bernerd Limbo, CNM [facility]  11/16/2021 11:15 AM [facility] NST [facility]   Franchesca Veneziano Dorathy Daft, MSN, CNM  Center for [facility]  10/15/21 12:25 PM

## 2021-10-16 ENCOUNTER — Encounter: Payer: Self-pay | Admitting: Radiology

## 2021-10-17 ENCOUNTER — Other Ambulatory Visit: Payer: Self-pay | Admitting: *Deleted

## 2021-10-17 DIAGNOSIS — F1210 Cannabis abuse, uncomplicated: Secondary | ICD-10-CM

## 2021-10-17 DIAGNOSIS — O10919 Unspecified pre-existing hypertension complicating pregnancy, unspecified trimester: Secondary | ICD-10-CM

## 2021-10-17 DIAGNOSIS — O99213 Obesity complicating pregnancy, third trimester: Secondary | ICD-10-CM

## 2021-10-17 LAB — CERVICOVAGINAL ANCILLARY ONLY
Chlamydia: NEGATIVE
Comment: NEGATIVE
Comment: NORMAL
Neisseria Gonorrhea: NEGATIVE

## 2021-10-18 ENCOUNTER — Telehealth: Payer: Self-pay

## 2021-10-18 LAB — CULTURE, BETA STREP (GROUP B ONLY): Strep Gp B Culture: NEGATIVE

## 2021-10-19 ENCOUNTER — Ambulatory Visit (INDEPENDENT_AMBULATORY_CARE_PROVIDER_SITE_OTHER): Payer: 59 | Admitting: Certified Nurse Midwife

## 2021-10-19 ENCOUNTER — Telehealth: Payer: Self-pay

## 2021-10-19 VITALS — BP 111/81 | HR 97 | Wt 173.6 lb

## 2021-10-19 DIAGNOSIS — O99013 Anemia complicating pregnancy, third trimester: Secondary | ICD-10-CM

## 2021-10-19 DIAGNOSIS — Z3A36 36 weeks gestation of pregnancy: Secondary | ICD-10-CM

## 2021-10-19 DIAGNOSIS — O10919 Unspecified pre-existing hypertension complicating pregnancy, unspecified trimester: Secondary | ICD-10-CM

## 2021-10-19 DIAGNOSIS — O0993 Supervision of high risk pregnancy, unspecified, third trimester: Secondary | ICD-10-CM

## 2021-10-19 NOTE — Telephone Encounter (Addendum)
Carrie [name], CNM  P [facility] Clinical Pool Hey,   Can we follow up with L&D staff in 1 week to get this patient set up with for eIOL for Oct 1, If it has not been done already.   Thank you!   - Danella Deis, CNM    ----------------------------  IOL scheduled. Iron infusion appt scheduled. Called pt to review. Pt requests afternoon appt. I explained these are not available at [facility]. I am unsure if these are available elsewhere but will call another location to check. Short Stay VM reached. Clinical staff will attempt to call a second time.

## 2021-10-21 ENCOUNTER — Ambulatory Visit (HOSPITAL_BASED_OUTPATIENT_CLINIC_OR_DEPARTMENT_OTHER): Payer: 59

## 2021-10-21 ENCOUNTER — Inpatient Hospital Stay (EMERGENCY_DEPARTMENT_HOSPITAL)
Admission: AD | Admit: 2021-10-21 | Discharge: 2021-10-21 | Disposition: A | Discharge status: Home / Self Care | Payer: 59 | Source: Home / Self Care | Attending: Obstetrics & Gynecology | Admitting: Obstetrics & Gynecology

## 2021-10-21 ENCOUNTER — Encounter (HOSPITAL_COMMUNITY): Payer: Self-pay | Admitting: Obstetrics & Gynecology

## 2021-10-21 ENCOUNTER — Ambulatory Visit: Payer: 59 | Admitting: *Deleted

## 2021-10-21 VITALS — BP 111/73 | HR 80

## 2021-10-21 DIAGNOSIS — R55 Syncope and collapse: Secondary | ICD-10-CM

## 2021-10-21 DIAGNOSIS — O0990 Supervision of high risk pregnancy, unspecified, unspecified trimester: Secondary | ICD-10-CM

## 2021-10-21 DIAGNOSIS — O4292 Full-term premature rupture of membranes, unspecified as to length of time between rupture and onset of labor: Secondary | ICD-10-CM

## 2021-10-21 DIAGNOSIS — J45909 Unspecified asthma, uncomplicated: Secondary | ICD-10-CM

## 2021-10-21 DIAGNOSIS — O99013 Anemia complicating pregnancy, third trimester: Secondary | ICD-10-CM

## 2021-10-21 DIAGNOSIS — O10913 Unspecified pre-existing hypertension complicating pregnancy, third trimester: Secondary | ICD-10-CM | POA: Insufficient documentation

## 2021-10-21 DIAGNOSIS — Z0371 Encounter for suspected problem with amniotic cavity and membrane ruled out: Secondary | ICD-10-CM | POA: Insufficient documentation

## 2021-10-21 DIAGNOSIS — O99213 Obesity complicating pregnancy, third trimester: Secondary | ICD-10-CM

## 2021-10-21 DIAGNOSIS — O99513 Diseases of the respiratory system complicating pregnancy, third trimester: Secondary | ICD-10-CM | POA: Diagnosis not present

## 2021-10-21 DIAGNOSIS — E669 Obesity, unspecified: Secondary | ICD-10-CM | POA: Diagnosis not present

## 2021-10-21 DIAGNOSIS — O10013 Pre-existing essential hypertension complicating pregnancy, third trimester: Secondary | ICD-10-CM

## 2021-10-21 DIAGNOSIS — Z3A37 37 weeks gestation of pregnancy: Secondary | ICD-10-CM | POA: Insufficient documentation

## 2021-10-21 DIAGNOSIS — N898 Other specified noninflammatory disorders of vagina: Secondary | ICD-10-CM

## 2021-10-21 DIAGNOSIS — Z3493 Encounter for supervision of normal pregnancy, unspecified, third trimester: Secondary | ICD-10-CM

## 2021-10-21 DIAGNOSIS — O471 False labor at or after 37 completed weeks of gestation: Secondary | ICD-10-CM | POA: Insufficient documentation

## 2021-10-21 LAB — POCT FERN TEST: POCT Fern Test: NEGATIVE

## 2021-10-21 LAB — AMNISURE RUPTURE OF MEMBRANE (ROM) NOT AT ARMC: Amnisure ROM: NEGATIVE

## 2021-10-21 MED ORDER — CYCLOBENZAPRINE HCL 10 MG PO TABS: 10.0000 mg | ORAL_TABLET | Freq: Two times a day (BID) | ORAL | 0 refills | Status: DC | PRN

## 2021-10-21 NOTE — Telephone Encounter (Signed)
Opened in error

## 2021-10-21 NOTE — Progress Notes (Signed)
   PRENATAL VISIT NOTE  Subjective:  Carrie [name] is a 31 y.o. [phone] at [redacted]w[redacted]d being seen today for ongoing prenatal care.  She is currently monitored for the following issues for this high-risk pregnancy and has Asthma; Vitamin D deficiency disease; Bipolar 2 disorder (HCC); Psychiatric disorder; ADHD; Anxiety; Cannabis hyperemesis syndrome concurrent with and due to cannabis abuse (HCC); Low blood potassium; Supervision of high risk pregnancy, antepartum; Chronic hypertension affecting pregnancy; Syncopal episodes; and Anemia in pregnancy on their problem list.  Patient reports no complaints.  Contractions: Irritability. Vag. Bleeding: None.  Movement: Present. Denies leaking of fluid.   The following portions of the patient's history were reviewed and updated as appropriate: allergies, current medications, past family history, past medical history, past social history, past surgical history and problem list.   Objective:   Vitals:   10/19/21 1037  BP: 111/81  Pulse: 97  Weight: 173 lb 9.6 oz (78.7 kg)    Fetal Status: Fetal Heart Rate (bpm): 158 Fundal Height: 37 cm Movement: Present  Presentation: Vertex  General:  Alert, oriented and cooperative. Patient is in no acute distress.  Skin: Skin is warm and dry. No rash noted.   Cardiovascular: Normal heart rate noted  Respiratory: Normal respiratory effort, no problems with respiration noted  Abdomen: Soft, gravid, appropriate for gestational age.  Pain/Pressure: Present     Pelvic: Cervical exam deferred        Extremities: Normal range of motion.  Edema: None  Mental Status: Normal mood and affect. Normal behavior. Normal judgment and thought content.   Assessment and Plan:  Pregnancy: [id number] at [redacted]w[redacted]d 1. Supervision of high risk pregnancy in third trimester - Doing well, feeling regular and vigorous fetal movement  - IOL scheduled for 11/06/21, orders in  2. [redacted] weeks gestation of pregnancy - Routine OB care   3. Anemia  during pregnancy in third trimester - Rescheduling iron infusion  4. Chronic hypertension affecting pregnancy - Stable on nifidepine, BP normal today, no s/sx PEC  Preterm labor symptoms and general obstetric precautions including but not limited to vaginal bleeding, contractions, leaking of fluid and fetal movement were reviewed in detail with the patient. Please refer to After Visit Summary for other counseling recommendations.   Return in about 1 week (around 10/26/2021) for IN-PERSON, LOB.  Future Appointments  Date Time Provider Department Center  10/21/2021  3:15 PM [facility] NURSE [facility]  10/21/2021  3:30 PM [facility] US2 [facility]  10/24/2021  8:30 AM [facility] RM 2 [facility] None  10/26/2021  1:15 PM Bernerd Limbo, CNM [facility]  10/28/2021  9:30 AM [facility] NURSE [facility]  10/28/2021  9:45 AM [facility] US7 [facility]  10/31/2021  1:00 PM MCINF-RM11 MC-MCINF None  11/02/2021 11:15 AM Bernerd Limbo, CNM [facility]  11/06/2021  6:30 AM MC-LD SCHED ROOM MC-INDC None  11/09/2021  3:35 PM Bernerd Limbo, CNM [facility]  11/16/2021 10:15 AM Bernerd Limbo, CNM [facility]  11/16/2021 11:15 AM [facility] NST [facility]    Bernerd Limbo, CNM

## 2021-10-21 NOTE — Telephone Encounter (Signed)
Scheduled venofer infusion on 9/25 @ 1pm. Called patient and informed her of appt. Patient verbalized understanding.

## 2021-10-21 NOTE — MAU Provider Note (Signed)
History     CSN: [id number]  Arrival date and time: 10/21/21 1626   Event Date/Time   First Provider Initiated Contact with Patient 10/21/21 1702      Chief Complaint  Patient presents with   Rupture of Membranes   Back Pain   HPI Carrie [name] is a 31 y.o. [id number] at [redacted]w[redacted]d who presents to MAU for r/o rupture of membranes. Patient was seen in MFM today and had a noted AFI of 5. She reports that for the last week or so she has been leaking some fluid, however not enough to warrant the use of a pad and not saturating through her clothes. She reports that she has not had any large gushes of fluid, however her underwear have been "more wet" than usual. She reports irregular contractions. No vaginal bleeding. Endorses normal active fetal movement. Pregnancy has been complicated by cHTN in which she is taking Procardia daily.   Patient receives prenatal care at [facility] and next appointment is scheduled on Wednesday 9/20. She also has an appointment at [facility] on Friday 9/22.  OB History     Gravida  4   Para  2   Term  2   Preterm      AB  1   Living  2      SAB      IAB  1   Ectopic      Multiple  0   Live Births  2           Past Medical History:  Diagnosis Date   Asthma    Bipolar 2 disorder (HCC)    Hypertension    Low blood potassium    Psychiatric disorder    Vitamin D deficiency disease     Past Surgical History:  Procedure Laterality Date   TONSILLECTOMY      Family History  Problem Relation Age of Onset   ADD / ADHD Mother        suspected   Healthy Father    Depression Sister    ADD / ADHD Brother    Seizures Son    Mental illness Paternal Uncle        schizophrenia, bipolar, ADHD   Cancer Neg Hx    Diabetes Neg Hx    Heart disease Neg Hx    Hypertension Neg Hx    Stroke Neg Hx    COPD Neg Hx     Social History   Tobacco Use   Smoking status: Never   Smokeless tobacco: Never  Vaping Use   Vaping Use: Never used  Substance  Use Topics   Alcohol use: No   Drug use: Not Currently    Types: Marijuana    Comment: month ago    Allergies: No Known Allergies  No medications prior to admission.   Review of Systems  Constitutional: Negative.   Respiratory: Negative.    Cardiovascular: Negative.   Gastrointestinal:  Positive for abdominal pain (contractions).  Genitourinary:  Positive for vaginal discharge. Negative for dysuria and vaginal bleeding.  Musculoskeletal:  Positive for back pain.  Neurological: Negative.    Physical Exam   Patient Vitals for the past 24 hrs:  BP Temp Temp src Pulse Resp SpO2  10/21/21 1819 114/71 -- -- 79 17 100 %  10/21/21 1650 115/81 98.1 F (36.7 C) Oral 86 14 100 %   Physical Exam Vitals and nursing note reviewed. Exam conducted with a chaperone present.  Constitutional:  General: She is not in acute distress. Eyes:     Extraocular Movements: Extraocular movements intact.     Pupils: Pupils are equal, round, and reactive to light.  Cardiovascular:     Rate and Rhythm: Normal rate.  Pulmonary:     Effort: Pulmonary effort is normal.  Abdominal:     Palpations: Abdomen is soft.     Tenderness: There is no abdominal tenderness.     Comments: Gravid   Genitourinary:    Comments: Normal external female genitalia, vaginal walls pink with rugae, moderate amount of white/clear watery discharge, no vaginal bleeding, cervix visually 2cm without lesions/masses Musculoskeletal:        General: Normal range of motion.     Cervical back: Normal range of motion.  Skin:    General: Skin is warm and dry.  Neurological:     General: No focal deficit present.     Mental Status: She is alert and oriented to person, place, and time.  Psychiatric:        Mood and Affect: Mood normal.        Behavior: Behavior normal.   Dilation: 2.5 Effacement (%): Thick Station: -3 Presentation: Vertex Exam by:: Camelia Eng, CNM  NST FHR: 125 bpm, moderate variability, +15x15  accels, no decels Toco: Q 5-64mins  Fern slide: negative  MAU Course  Procedures  MDM Sterile speculum exam with moderate amount of white/clear thin, watery discharge. Fern slide negative. Amnisure negative. Cervix unchanged from previous exam. I offered patient recheck given contractions, however patient declines wishing to be discharged home as contractions are not that painful. NST reactive and reassuring. Patient's membranes are intact and patient is not in active labor. I recommend that patient increase hydration over the weekend given AFI today and contractions. She has close follow up next week at [facility] and MFM.   Assessment and Plan  [redacted] weeks gestation of pregnancy Intact amniotic membranes  - Discharge home in stable condition - Strict return precautions. Return to MAU as needed for new/worsening symptoms - Keep appointments at [facility] and MFM as scheduled next week   Brand Males, CNM 10/21/2021, 6:50 PM

## 2021-10-21 NOTE — MAU Note (Signed)
.  Carrie [name] is a 31 y.o. at [redacted]w[redacted]d here in MAU reporting: Leaking of clear watery discharge x1 week. States she had an [place] today and her AFI was 5 so they sent her in for a ROR. Denies VB. +FM. Is having back pain she rates 10/10.  Vitals:   10/21/21 1650  BP: 115/81  Pulse: 86  Resp: 14  Temp: 98.1 F (36.7 C)  SpO2: 100%     FHT:151

## 2021-10-23 ENCOUNTER — Encounter (HOSPITAL_COMMUNITY): Payer: Self-pay | Admitting: Family Medicine

## 2021-10-23 ENCOUNTER — Inpatient Hospital Stay (HOSPITAL_COMMUNITY)
Admission: AD | Admit: 2021-10-23 | Discharge: 2021-10-25 | DRG: 807 | Disposition: A | Discharge status: Home / Self Care | Payer: 59 | Attending: Family Medicine | Admitting: Family Medicine

## 2021-10-23 DIAGNOSIS — O0993 Supervision of high risk pregnancy, unspecified, third trimester: Secondary | ICD-10-CM

## 2021-10-23 DIAGNOSIS — J45909 Unspecified asthma, uncomplicated: Secondary | ICD-10-CM | POA: Diagnosis present

## 2021-10-23 DIAGNOSIS — O26893 Other specified pregnancy related conditions, third trimester: Secondary | ICD-10-CM | POA: Diagnosis present

## 2021-10-23 DIAGNOSIS — O1002 Pre-existing essential hypertension complicating childbirth: Principal | ICD-10-CM | POA: Diagnosis present

## 2021-10-23 DIAGNOSIS — O9952 Diseases of the respiratory system complicating childbirth: Secondary | ICD-10-CM | POA: Diagnosis present

## 2021-10-23 DIAGNOSIS — O9902 Anemia complicating childbirth: Secondary | ICD-10-CM | POA: Diagnosis present

## 2021-10-23 DIAGNOSIS — Z3A37 37 weeks gestation of pregnancy: Secondary | ICD-10-CM

## 2021-10-23 DIAGNOSIS — Z23 Encounter for immunization: Secondary | ICD-10-CM

## 2021-10-23 DIAGNOSIS — O99214 Obesity complicating childbirth: Secondary | ICD-10-CM | POA: Diagnosis present

## 2021-10-23 DIAGNOSIS — O99324 Drug use complicating childbirth: Secondary | ICD-10-CM | POA: Diagnosis not present

## 2021-10-23 LAB — CBC
HCT: 28.4 % — ABNORMAL LOW (ref 36.0–46.0)
Hemoglobin: 9.1 g/dL — ABNORMAL LOW (ref 12.0–15.0)
MCH: 24.2 pg — ABNORMAL LOW (ref 26.0–34.0)
MCHC: 32 g/dL (ref 30.0–36.0)
MCV: 75.5 fL — ABNORMAL LOW (ref 80.0–100.0)
Platelets: 238 10*3/uL (ref 150–400)
RBC: 3.76 MIL/uL — ABNORMAL LOW (ref 3.87–5.11)
RDW: 14.6 % (ref 11.5–15.5)
WBC: 6.7 10*3/uL (ref 4.0–10.5)
nRBC: 0 % (ref 0.0–0.2)

## 2021-10-23 MED ORDER — LACTATED RINGERS IV SOLN: INTRAVENOUS | Status: DC

## 2021-10-23 NOTE — MAU Note (Addendum)
Francelia Mclaren is a 31 y.o. at [redacted]w[redacted]d here in MAU reporting: CTX that began at 1800 today. Pt states the CTX were back to back at home before she came in but then began to ease up. Pt states she is having white vaginal discharge with no odor. Pt states the ctx are 8/10. Pt states she is having back pain and a lot of pressure in her pelvis. +FM. Pt denies VB or LOF. Pt states she was checked on Friday and was 3cm.   Onset of complaint: 1800 10/23/2021 Pain score: 8/10 abdomen ctx  10/10 back pain 10/10 pressure Vitals:   10/23/21 2010  BP: 109/73  Pulse: 81  Resp: 12  Temp: 98.2 F (36.8 C)  SpO2: 100%     FHT:132 Lab orders placed from triage:

## 2021-10-23 NOTE — H&P (Signed)
OBSTETRIC ADMISSION HISTORY AND PHYSICAL  Carrie [name] is a 31 y.o. female [phone] with IUP at [redacted]w[redacted]d presenting for labor. She reports +FMs. No LOF, VB, blurry vision, headaches, peripheral edema, or RUQ pain. She plans on breastfeeding. She requests IUD for birth control.  Dating: By LMP --->  Estimated Date of Delivery: 11/11/21  Sono:    @[redacted]w[redacted]d , normal anatomy, cephalic presentation, 2651g, , EFW 5'14   Prenatal History/Complications: -CHTN -Obesity -Asthma -Marijuana use -Anemia  Past Medical History: Past Medical History:  Diagnosis Date   Asthma    Bipolar 2 disorder (HCC)    Hypertension    Low blood potassium    Psychiatric disorder    Vitamin D deficiency disease     Past Surgical History: Past Surgical History:  Procedure Laterality Date   TONSILLECTOMY      Obstetrical History: OB History     Gravida  4   Para  2   Term  2   Preterm      AB  1   Living  2      SAB      IAB  1   Ectopic      Multiple  0   Live Births  2           Social History: Social History   Socioeconomic History   Marital status: Legally Separated    Spouse name: Not on file   Number of children: Not on file   Years of education: Not on file   Highest education level: Not on file  Occupational History   Not on file  Tobacco Use   Smoking status: Never   Smokeless tobacco: Never  Vaping Use   Vaping Use: Never used  Substance and Sexual Activity   Alcohol use: No   Drug use: Not Currently    Types: Marijuana    Comment: month ago   Sexual activity: Not Currently    Birth control/protection: None  Other Topics Concern   Not on file  Social History Narrative   Not on file   Social Determinants of Health   Financial Resource Strain: Not on file  Food Insecurity: Food Insecurity Present (04/07/2021)   Hunger Vital Sign    Worried About Running Out of Food in the Last Year: Sometimes true    Ran Out of Food in the Last Year: Sometimes  true  Transportation Needs: No Transportation Needs (04/07/2021)   PRAPARE - 06/07/2021 (Medical): No    Lack of Transportation (Non-Medical): No  Physical Activity: Not on file  Stress: Not on file  Social Connections: Not on file    Family History: Family History  Problem Relation Age of Onset   ADD / ADHD Mother        suspected   Healthy Father    Depression Sister    ADD / ADHD Brother    Seizures Son    Mental illness Paternal Uncle        schizophrenia, bipolar, ADHD   Cancer Neg Hx    Diabetes Neg Hx    Heart disease Neg Hx    Hypertension Neg Hx    Stroke Neg Hx    COPD Neg Hx     Allergies: No Known Allergies  Medications Prior to Admission  Medication Sig Dispense Refill Last Dose   albuterol (VENTOLIN HFA) 108 (90 Base) MCG/ACT inhaler Inhale 2 puffs into the lungs every 6 (six) hours as needed for wheezing  or shortness of breath. 8 g 2 Past Week   fluticasone-salmeterol (ADVAIR HFA) 45-21 MCG/ACT inhaler Inhale 2 puffs into the lungs 2 (two) times daily. 1 each 12 Past Week   cyclobenzaprine (FLEXERIL) 10 MG tablet Take 1 tablet (10 mg total) by mouth 2 (two) times daily as needed for muscle spasms. 20 tablet 0    Prenatal MV & Min w/FA-DHA (PRENATAL ADULT GUMMY/DHA/FA) 0.4-25 MG CHEW Chew 1 Dose by mouth daily. Daily dose indicated on package. (Patient not taking: Reported on 10/14/2021) 30 tablet 11    Prenatal Vit-Fe Fumarate-FA (PRETAB) 29-1 MG TABS Take 1 tablet by mouth daily. (Patient not taking: Reported on 09/23/2021) 90 tablet 3    prochlorperazine (COMPAZINE) 10 MG tablet Take 1 tablet (10 mg total) by mouth 3 (three) times daily as needed for nausea or vomiting. (Patient not taking: Reported on 05/12/2021) 30 tablet 3      Review of Systems:  All systems reviewed and negative except as stated in HPI  PE: Blood pressure 109/73, pulse 81, temperature 98.2 F (36.8 C), temperature source Oral, resp. rate 12, height 5\' 1"   (1.549 m), weight 79.5 kg, last menstrual period 02/04/2021, SpO2 100 %. General appearance: alert, cooperative, and mild distress Lungs: regular rate and effort Heart: regular rate  Abdomen: soft, non-tender Extremities: Homans sign is negative, no sign of DVT Presentation: cephalic EFM: 322 bpm, mod variability, + accels, no decels Toco: 3-4 Dilation: 5.5 Effacement (%): 80 Station: -2 Exam by:: Donzetta Sprung, RN   Prenatal labs: ABO, Rh: B/Positive/-- (03/10 1041) Antibody: Negative (03/10 1041) Rubella: 1.50 (03/10 1041) RPR: Non Reactive (08/18 0938)  HBsAg: Negative (03/10 1041)  HIV: Non Reactive (08/18 0938)  GBS: Negative/-- (09/08 1151)  2 hr GTT nml  Prenatal Transfer Tool  Maternal Diabetes: No Genetic Screening: Normal Maternal Ultrasounds/Referrals: Normal Fetal Ultrasounds or other Referrals:  Referred to Materal Fetal Medicine  Maternal Substance Abuse:  Yes:  Type: Marijuana Significant Maternal Medications:  Meds include: Other: Procardia Significant Maternal Lab Results: Group B Strep negative  No results found for this or any previous visit (from the past 24 hour(s)).  Patient Active Problem List   Diagnosis Date Noted   Syncopal episodes 10/13/2021   Anemia in pregnancy 10/13/2021   Chronic hypertension affecting pregnancy 04/15/2021   Low blood potassium 04/07/2021   Supervision of high risk pregnancy, antepartum 04/07/2021   Cannabis hyperemesis syndrome concurrent with and due to cannabis abuse ([place]) 04/01/2021   Anxiety 05/29/2019   ADHD 03/21/2016   Vitamin D deficiency disease    Bipolar 2 disorder ([place])    Psychiatric disorder    Asthma 07/30/2012    Assessment: Carrie [name] is a 31 y.o. [id number] at [redacted]w[redacted]d here for labor  1. Labor: active 2. FWB: Cat I 3. Pain: analgesia/anesthesia prn 4. GBS: neg 5. CHTN: stable   Plan: Admit to LD Expectant mngt Anticipate SVD  Julianne Handler, CNM  10/23/2021, 11:08 PM

## 2021-10-24 ENCOUNTER — Other Ambulatory Visit: Payer: Self-pay

## 2021-10-24 ENCOUNTER — Encounter (HOSPITAL_COMMUNITY): Payer: 59

## 2021-10-24 ENCOUNTER — Encounter: Payer: Self-pay | Admitting: Pulmonary Disease

## 2021-10-24 ENCOUNTER — Encounter (HOSPITAL_COMMUNITY): Payer: Self-pay | Admitting: Family Medicine

## 2021-10-24 DIAGNOSIS — Z3A37 37 weeks gestation of pregnancy: Secondary | ICD-10-CM

## 2021-10-24 DIAGNOSIS — O99324 Drug use complicating childbirth: Secondary | ICD-10-CM

## 2021-10-24 DIAGNOSIS — O1002 Pre-existing essential hypertension complicating childbirth: Secondary | ICD-10-CM

## 2021-10-24 DIAGNOSIS — O99214 Obesity complicating childbirth: Secondary | ICD-10-CM

## 2021-10-24 LAB — COMPREHENSIVE METABOLIC PANEL
ALT: 13 U/L (ref 0–44)
AST: 17 U/L (ref 15–41)
Albumin: 2.8 g/dL — ABNORMAL LOW (ref 3.5–5.0)
Alkaline Phosphatase: 243 U/L — ABNORMAL HIGH (ref 38–126)
Anion gap: 12 (ref 5–15)
BUN: 8 mg/dL (ref 6–20)
CO2: 20 mmol/L — ABNORMAL LOW (ref 22–32)
Calcium: 9.5 mg/dL (ref 8.9–10.3)
Chloride: 105 mmol/L (ref 98–111)
Creatinine, Ser: 0.7 mg/dL (ref 0.44–1.00)
GFR, Estimated: >60 mL/min (ref >60)
Glucose, Bld: 80 mg/dL (ref 70–99)
Potassium: 4 mmol/L (ref 3.5–5.1)
Sodium: 137 mmol/L (ref 135–145)
Total Bilirubin: 0.6 mg/dL (ref 0.3–1.2)
Total Protein: 7.2 g/dL (ref 6.5–8.1)

## 2021-10-24 LAB — RPR: RPR Ser Ql: NONREACTIVE

## 2021-10-24 LAB — TYPE AND SCREEN
ABO/RH(D): B POS
Antibody Screen: NEGATIVE

## 2021-10-24 MED ORDER — PRENATAL MULTIVITAMIN CH
1.0000 | ORAL_TABLET | Freq: Every day | ORAL | Status: DC
  Administered 2021-10-24 – 2021-10-25 (×2): 1 via ORAL
  Filled 2021-10-24 (×2): qty 1

## 2021-10-24 MED ORDER — OXYCODONE-ACETAMINOPHEN 5-325 MG PO TABS: 1.0000 | ORAL_TABLET | ORAL | Status: DC | PRN

## 2021-10-24 MED ORDER — IBUPROFEN 600 MG PO TABS
ORAL_TABLET | ORAL | Status: AC
  Administered 2021-10-24: 600 mg via ORAL
  Filled 2021-10-24: qty 1

## 2021-10-24 MED ORDER — TERBUTALINE SULFATE 1 MG/ML IJ SOLN: 0.2500 mg | Freq: Once | INTRAMUSCULAR | Status: DC | PRN

## 2021-10-24 MED ORDER — ONDANSETRON HCL 4 MG/2ML IJ SOLN: 4.0000 mg | INTRAMUSCULAR | Status: DC | PRN

## 2021-10-24 MED ORDER — SIMETHICONE 80 MG PO CHEW: 80.0000 mg | CHEWABLE_TABLET | ORAL | Status: DC | PRN

## 2021-10-24 MED ORDER — BENZOCAINE-MENTHOL 20-0.5 % EX AERO
1.0000 | INHALATION_SPRAY | CUTANEOUS | Status: DC | PRN
  Administered 2021-10-24: 1 via TOPICAL
  Filled 2021-10-24: qty 56

## 2021-10-24 MED ORDER — DIPHENHYDRAMINE HCL 50 MG/ML IJ SOLN
25.0000 mg | Freq: Once | INTRAMUSCULAR | Status: AC
  Administered 2021-10-24: 25 mg via INTRAVENOUS
  Filled 2021-10-24: qty 1

## 2021-10-24 MED ORDER — ACETAMINOPHEN 325 MG PO TABS: 650.0000 mg | ORAL_TABLET | ORAL | Status: DC | PRN

## 2021-10-24 MED ORDER — DIPHENHYDRAMINE HCL 25 MG PO CAPS: 25.0000 mg | ORAL_CAPSULE | Freq: Four times a day (QID) | ORAL | Status: DC | PRN

## 2021-10-24 MED ORDER — FLUTICASONE FUROATE-VILANTEROL 100-25 MCG/ACT IN AEPB
1.0000 | INHALATION_SPRAY | Freq: Every day | RESPIRATORY_TRACT | Status: DC
  Administered 2021-10-24: 1 via RESPIRATORY_TRACT
  Filled 2021-10-24: qty 28

## 2021-10-24 MED ORDER — ALBUTEROL SULFATE (2.5 MG/3ML) 0.083% IN NEBU: 3.0000 mL | INHALATION_SOLUTION | Freq: Four times a day (QID) | RESPIRATORY_TRACT | Status: DC | PRN

## 2021-10-24 MED ORDER — FUROSEMIDE 20 MG PO TABS
20.0000 mg | ORAL_TABLET | Freq: Every day | ORAL | Status: DC
  Administered 2021-10-25: 20 mg via ORAL
  Filled 2021-10-24: qty 1

## 2021-10-24 MED ORDER — ONDANSETRON HCL 4 MG PO TABS: 4.0000 mg | ORAL_TABLET | ORAL | Status: DC | PRN

## 2021-10-24 MED ORDER — PRENATAL ADULT GUMMY/DHA/FA 0.4-25 MG PO CHEW: 1.0000 | CHEWABLE_TABLET | Freq: Every day | ORAL | Status: DC

## 2021-10-24 MED ORDER — SOD CITRATE-CITRIC ACID 500-334 MG/5ML PO SOLN: 30.0000 mL | ORAL | Status: DC | PRN

## 2021-10-24 MED ORDER — WITCH HAZEL-GLYCERIN EX PADS: 1.0000 | MEDICATED_PAD | CUTANEOUS | Status: DC | PRN

## 2021-10-24 MED ORDER — OXYTOCIN BOLUS FROM INFUSION
333.0000 mL | Freq: Once | INTRAVENOUS | Status: AC
  Administered 2021-10-24: 333 mL via INTRAVENOUS

## 2021-10-24 MED ORDER — FENTANYL CITRATE (PF) 100 MCG/2ML IJ SOLN
50.0000 ug | INTRAMUSCULAR | Status: DC | PRN
  Administered 2021-10-24 (×4): 100 ug via INTRAVENOUS
  Filled 2021-10-24 (×4): qty 2

## 2021-10-24 MED ORDER — IBUPROFEN 600 MG PO TABS
600.0000 mg | ORAL_TABLET | Freq: Four times a day (QID) | ORAL | Status: DC
  Administered 2021-10-24 – 2021-10-25 (×5): 600 mg via ORAL
  Filled 2021-10-24 (×5): qty 1

## 2021-10-24 MED ORDER — OXYTOCIN-SODIUM CHLORIDE 30-0.9 UT/500ML-% IV SOLN
1.0000 m[IU]/min | INTRAVENOUS | Status: DC
  Administered 2021-10-24: 2 m[IU]/min via INTRAVENOUS
  Administered 2021-10-24: 4 m[IU]/min via INTRAVENOUS
  Filled 2021-10-24: qty 500

## 2021-10-24 MED ORDER — INFLUENZA VAC SPLIT QUAD 0.5 ML IM SUSY
0.5000 mL | PREFILLED_SYRINGE | INTRAMUSCULAR | Status: AC
  Administered 2021-10-25: 0.5 mL via INTRAMUSCULAR
  Filled 2021-10-24: qty 0.5

## 2021-10-24 MED ORDER — LACTATED RINGERS IV SOLN: 500.0000 mL | INTRAVENOUS | Status: DC | PRN

## 2021-10-24 MED ORDER — OXYCODONE-ACETAMINOPHEN 5-325 MG PO TABS: 2.0000 | ORAL_TABLET | ORAL | Status: DC | PRN

## 2021-10-24 MED ORDER — OXYTOCIN-SODIUM CHLORIDE 30-0.9 UT/500ML-% IV SOLN: 2.5000 [IU]/h | INTRAVENOUS | Status: DC

## 2021-10-24 MED ORDER — SENNOSIDES-DOCUSATE SODIUM 8.6-50 MG PO TABS
2.0000 | ORAL_TABLET | ORAL | Status: DC
  Administered 2021-10-24: 2 via ORAL
  Filled 2021-10-24: qty 2

## 2021-10-24 MED ORDER — ONDANSETRON HCL 4 MG/2ML IJ SOLN: 4.0000 mg | Freq: Four times a day (QID) | INTRAMUSCULAR | Status: DC | PRN

## 2021-10-24 MED ORDER — DIBUCAINE (PERIANAL) 1 % EX OINT: 1.0000 | TOPICAL_OINTMENT | CUTANEOUS | Status: DC | PRN

## 2021-10-24 MED ORDER — FLEET ENEMA 7-19 GM/118ML RE ENEM: 1.0000 | ENEMA | RECTAL | Status: DC | PRN

## 2021-10-24 MED ORDER — LIDOCAINE HCL (PF) 1 % IJ SOLN: 30.0000 mL | INTRAMUSCULAR | Status: DC | PRN

## 2021-10-24 MED ORDER — COCONUT OIL OIL: 1.0000 | TOPICAL_OIL | Status: DC | PRN

## 2021-10-24 MED ORDER — TETANUS-DIPHTH-ACELL PERTUSSIS 5-2.5-18.5 LF-MCG/0.5 IM SUSY: 0.5000 mL | PREFILLED_SYRINGE | Freq: Once | INTRAMUSCULAR | Status: DC

## 2021-10-24 NOTE — Lactation Note (Signed)
This note was copied from a baby's chart. Lactation Consultation Note  Patient Name: Carrie [name] [id number] Date: 10/24/2021 Reason for consult: Initial assessment;Early term 37-38.6wks Age:31 hours  P3, Mother breastfed last child for 6 mos. Baby latched upon entering on R breast. Prepumped L breast and baby was able to sustain latch on both breasts.  Encouraged mother to compress breast during feeding to keep baby active. Feed on demand with cues.  Goal 8-12+ times per day after first 24 hrs.  Place baby STS if not cueing.  Mom made aware of O/P services, breastfeeding support group and our phone # for post-discharge questions.    Maternal Data Has patient been taught Hand Expression?: Yes Does the patient have breastfeeding experience prior to this delivery?: Yes How long did the patient breastfeed?: 6 mos.  Feeding Mother's Current Feeding Choice: Breast Milk  LATCH Score Latch: Grasps breast easily, tongue down, lips flanged, rhythmical sucking.  Audible Swallowing: A few with stimulation  Type of Nipple: Everted at rest and after stimulation  Comfort (Breast/Nipple): Soft / non-tender  Hold (Positioning): Assistance needed to correctly position infant at breast and maintain latch.  LATCH Score: 8   Lactation Tools Discussed/Used Tools: Pump Breast pump type: Manual Reason for Pumping: stimulation  Interventions Interventions: Assisted with latch;Skin to skin;Pre-pump if needed;Education;LC Services brochure  Discharge    Consult Status Consult Status: Follow-up Date: 10/25/21 Follow-up type: In-patient    Vivianne Master [facility] 10/24/2021, 2:25 PM

## 2021-10-24 NOTE — Progress Notes (Signed)
Labor Progress Note Carrie [name] is a 31 y.o. [phone] at [redacted]w[redacted]d presented for labor  S:  Feeling painful ctx but recent dose of Fentanyl helped. Asking to have water broken, "just need this to be over".  O:  BP 109/73 (BP Location: Right Arm)   Pulse 81   Temp 98.2 F (36.8 C) (Oral)   Resp 12   Ht 5\' 1"  (1.549 m)   Wt 79.5 kg   LMP 02/04/2021   SpO2 100%   BMI 33.12 kg/m  EFM: baseline 120 bpm/ mod variability/ + accels/ no decels  Toco/IUPC: 3-4 SVE: Dilation: 5.5 Effacement (%): 80 Cervical Position: Anterior Station: -2 Presentation: Vertex Exam by:: Coen Miyasato, CNM   A/P: 31 y.o. [id number] [redacted]w[redacted]d  1. Labor: active, slow progress 2. FWB: Cat I 3. Pain: analgesia/anesthesia/NO prn 4. CHTN: stable  Consented for AROM, AROM for scant clear fluid. Anticipate labor progress and SVD.  Julianne Handler, CNM 3:09 AM

## 2021-10-24 NOTE — Discharge Summary (Signed)
Postpartum Discharge Summary  Date of Service updated- yes     Patient Name: Carrie [name] DOB: [birth year] MRN: [id number]  Date of admission: 10/23/2021 Delivery date:10/24/2021  Delivering provider: PRAY, Joycelyn Schmid E  Date of discharge: 10/25/2021  Admitting diagnosis: Normal labor [O80, Z37.9] Intrauterine pregnancy: [redacted]w[redacted]d    Secondary diagnosis:  Principal Problem:   Normal labor  Additional problems: chronic hypertension, anemia of pregnancy    Discharge diagnosis: Term Pregnancy Delivered, CHTN, and Anemia                                              Post partum procedures: none Augmentation: AROM and Pitocin Complications: None  Hospital course: Onset of Labor With Vaginal Delivery      31y.o. yo [id number] admitted in Active Labor on 10/23/2021. Patient had an uncomplicated labor course as follows:  Membrane Rupture Time/Date: 3:07 AM ,10/24/2021   Delivery Method:Vaginal, Spontaneous  Episiotomy: None  Lacerations:  None  Patient had an uncomplicated postpartum course.she received IV venofer postpartum. She is ambulating, tolerating a regular diet, passing flatus, and urinating well. Patient is discharged home in stable condition on 10/25/21.  Newborn Data: Birth date:10/24/2021  Birth time:10:54 AM  Gender:Female  Living status:Living  Apgars:9 ,9  Weight:2880 g   Magnesium Sulfate received: No BMZ received: No Rhophylac:N/A MMR:N/A T-DaP:Given prenatally Flu: No Transfusion:No  Physical exam  Vitals:   10/24/21 1340 10/24/21 1733 10/24/21 2116 10/25/21 0229  BP: 108/72 122/78 116/75 101/65  Pulse: 79 74 73 80  Resp: 17 18  17   Temp: 97.9 F (36.6 C) 97.7 F (36.5 C) 98.2 F (36.8 C) 97.8 F (36.6 C)  TempSrc: Oral Oral Oral Oral  SpO2:   100% 100%  Weight:      Height:       General: alert, cooperative, and no distress Lochia: appropriate Uterine Fundus: firm Incision: N/A DVT Evaluation: No evidence of DVT seen on physical  exam. Labs: Lab Results  Component Value Date   WBC 10.5 10/25/2021   HGB 8.6 (L) 10/25/2021   HCT 27.0 (L) 10/25/2021   MCV 75.6 (L) 10/25/2021   PLT 223 10/25/2021      Latest Ref Rng & Units 10/23/2021   11:11 PM  CMP  Glucose 70 - 99 mg/dL 80   BUN 6 - 20 mg/dL 8   Creatinine 0.44 - 1.00 mg/dL 0.70   Sodium 135 - 145 mmol/L 137   Potassium 3.5 - 5.1 mmol/L 4.0   Chloride 98 - 111 mmol/L 105   CO2 22 - 32 mmol/L 20   Calcium 8.9 - 10.3 mg/dL 9.5   Total Protein 6.5 - 8.1 g/dL 7.2   Total Bilirubin 0.3 - 1.2 mg/dL 0.6   Alkaline Phos 38 - 126 U/L 243   AST 15 - 41 U/L 17   ALT 0 - 44 U/L 13    Edinburgh Score:    10/24/2021    5:33 PM  Edinburgh Postnatal Depression Scale Screening Tool  I have been able to laugh and see the funny side of things. 0  I have looked forward with enjoyment to things. 0  I have blamed myself unnecessarily when things went wrong. 0  I have been anxious or worried for no good reason. 0  I have felt scared or panicky for no good reason.  0  Things have been getting on top of me. 0  I have been so unhappy that I have had difficulty sleeping. 0  I have felt sad or miserable. 0  I have been so unhappy that I have been crying. 0  The thought of harming myself has occurred to me. 0  Edinburgh Postnatal Depression Scale Total 0     After visit meds:  Allergies as of 10/25/2021   No Known Allergies      Medication List     TAKE these medications    albuterol 108 (90 Base) MCG/ACT inhaler Commonly known as: VENTOLIN HFA Inhale 2 puffs into the lungs every 6 (six) hours as needed for wheezing or shortness of breath.   fluticasone-salmeterol 45-21 MCG/ACT inhaler Commonly known as: Advair HFA Inhale 2 puffs into the lungs 2 (two) times daily.   furosemide 20 MG tablet Commonly known as: LASIX Take 1 tablet (20 mg total) by mouth daily for 4 days. Start taking on: October 26, 2021   Prenatal Adult Gummy/DHA/FA 0.4-25 MG Chew Chew  1 Dose by mouth daily. Daily dose indicated on package.         Discharge home in stable condition Infant Feeding: Breast Infant Disposition:home with mother Discharge instruction: per After Visit Summary and Postpartum booklet. Activity: Advance as tolerated. Pelvic rest for 6 weeks.  Diet: routine diet Future Appointments: Future Appointments  Date Time Provider [place]  10/31/2021 10:00 AM [facility] NURSE [facility]  10/31/2021  1:00 PM [facility] None  12/01/2021  3:15 PM Donnamae Jude, MD [facility]   Follow up Visit:  [place] [facility] [place] [facility] Follow up.   Specialty: Obstetrics and Gynecology Contact information: [place] [id number] [phone]                Message sent to schedulers on 10/25/21 by Dr Thompson Grayer  Please schedule this patient for a In person postpartum visit in 6 weeks with the following provider: MD. Additional Postpartum F/U:BP check 1 week  High risk pregnancy complicated by:  Chronic HTN, no medications Delivery mode:  Vaginal, Spontaneous  Anticipated Birth Control:  IUD   Liliane Channel MD MPH OB Fellow, [place] for [place] 10/25/2021

## 2021-10-24 NOTE — Progress Notes (Signed)
Labor Progress Note Carrie [name] is a 31 y.o. [phone] at [redacted]w[redacted]d presented for labor  S:  Feeling some ctx but less frequent now.  O:  BP 115/81   Pulse 83   Temp 97.8 F (36.6 C) (Oral)   Resp 16   Ht 5\' 1"  (1.549 m)   Wt 79.5 kg   LMP 02/04/2021   SpO2 100%   BMI 33.12 kg/m  EFM: baseline 120 bpm/ mod variability/ + accels/ no decels  Toco/IUPC: 6-7 SVE: Dilation: 4 Effacement (%): 50 Cervical Position: Middle Station: -2 Presentation: Vertex Exam by:: Ileana Ladd RN   A/P: 31 y.o. [id number] [redacted]w[redacted]d  1. Labor: protracted 2. FWB: Cat I 3. Pain: analgesia/anesthesia/NO prn 4. CHTN: stable  Recommend Pitocin augmentation. Anticipate labor progress and SVD.  Julianne Handler, CNM 7:49 AM

## 2021-10-24 NOTE — Progress Notes (Addendum)
Labor Progress Note Carrie [name] is a 31 y.o. [id number] at [redacted]w[redacted]d presented for labor  S:  Feeling her contractions more, at bedside to meet patient. Has to pause and deep breath through contractions. Desiring IV pain meds, does not want epidural at this time.  O:  BP 124/87   Pulse 64   Temp 97.8 F (36.6 C) (Oral)   Resp 16   Ht 5\' 1"  (1.549 m)   Wt 79.5 kg   LMP 02/04/2021   SpO2 100%   BMI 33.12 kg/m  EFM: baseline 120 bpm/ mod variability/ + accels/ no decels  Toco/IUPC: q4-5 min, having to stop talking and breathe through SVE: Dilation: 6 Effacement (%): 80 Cervical Position: Middle Station: -1 Presentation: Vertex Exam by:: Dorinda Hill, RN   A/P: 31 y.o. [id number] [redacted]w[redacted]d  1. Labor: protracted, s/p AROM 0310 and now on pitocin and progressing. 2. FWB: Cat I 3. Pain: analgesia/anesthesia/NO prn 4. CHTN: stable, only mild range Bps mostly normotensive, no home medications. Pre-E labs negative for severe features. 5. Anemia- Hgb 9.1 on admission. Consider IV venofer postpartum.  Anticipate labor progress and SVD.  Lenoria Chime, MD 9:47 AM

## 2021-10-25 ENCOUNTER — Other Ambulatory Visit (HOSPITAL_COMMUNITY): Payer: Self-pay

## 2021-10-25 ENCOUNTER — Encounter: Payer: Self-pay | Admitting: Pulmonary Disease

## 2021-10-25 LAB — CBC
HCT: 27 % — ABNORMAL LOW (ref 36.0–46.0)
Hemoglobin: 8.6 g/dL — ABNORMAL LOW (ref 12.0–15.0)
MCH: 24.1 pg — ABNORMAL LOW (ref 26.0–34.0)
MCHC: 31.9 g/dL (ref 30.0–36.0)
MCV: 75.6 fL — ABNORMAL LOW (ref 80.0–100.0)
Platelets: 223 10*3/uL (ref 150–400)
RBC: 3.57 MIL/uL — ABNORMAL LOW (ref 3.87–5.11)
RDW: 14.6 % (ref 11.5–15.5)
WBC: 10.5 10*3/uL (ref 4.0–10.5)
nRBC: 0 % (ref 0.0–0.2)

## 2021-10-25 MED ORDER — SODIUM CHLORIDE 0.9 % IV SOLN
500.0000 mg | Freq: Once | INTRAVENOUS | Status: AC
  Administered 2021-10-25: 500 mg via INTRAVENOUS
  Filled 2021-10-25: qty 500

## 2021-10-25 MED ORDER — FERROUS SULFATE 325 (65 FE) MG PO TABS: 325.0000 mg | ORAL_TABLET | Freq: Two times a day (BID) | ORAL | 1 refills | Status: AC

## 2021-10-25 MED ORDER — FERROUS SULFATE 325 (65 FE) MG PO TABS
325.0000 mg | ORAL_TABLET | Freq: Two times a day (BID) | ORAL | 1 refills | Status: DC
  Filled 2021-10-25: qty 60, 30d supply, fill #0

## 2021-10-25 MED ORDER — FUROSEMIDE 20 MG PO TABS
20.0000 mg | ORAL_TABLET | Freq: Every day | ORAL | 0 refills | Status: AC
  Filled 2021-10-25: qty 4, 4d supply, fill #0
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
## 2021-10-25 NOTE — Progress Notes (Signed)
Circumcision Consent  Discussed with mom at bedside about circumcision.   Circumcision is a surgery that removes the skin that covers the tip of the penis, called the "foreskin." Circumcision is usually done when a boy is between 73 and 14 days old, sometimes up to 67-57 weeks old.  The most common reasons boys are circumcised include for cultural/religious beliefs or for parental preference (potentially easier to clean, so baby looks like daddy, etc).  There may be some medical benefits for circumcision:   Circumcised boys seem to have slightly lower rates of: ? Urinary tract infections (per the American Academy of Pediatrics an uncircumcised boy has a 1/100 chance of developing a UTI in the first year of life, a circumcised boy at a 02/998 chance of developing a UTI in the first year of life- a 10% reduction) ? Penis cancer (typically rare- an uncircumcised female has a 1 in 100,000 chance of developing cancer of the penis) ? Sexually transmitted infection (in endemic areas, including HIV, HPV and Herpes- circumcision does NOT protect against gonorrhea, chlamydia, trachomatis, or syphilis) ? Phimosis: a condition where that makes retraction of the foreskin over the glans impossible (0.4 per 1000 boys per year or 0.6% of boys are affected by their 15th birthday)  Boys and men who are not circumcised can reduce these extra risks by: ? Cleaning their penis well ? Using condoms during sex  What are the risks of circumcision?  As with any surgical procedure, there are risks and complications. In circumcision, complications are rare and usually minor, the most common being: ? Bleeding- risk is reduced by holding each clamp for 30 seconds prior to a cut being made, and by holding pressure after the procedure is done ? Infection- the penis is cleaned prior to the procedure, and the procedure is done under sterile technique ? Damage to the urethra or amputation of the penis  How is circumcision done  in baby boys?  The baby will be placed on a special table and the legs restrained for their safety. Numbing medication is injected into the penis, and the skin is cleansed with betadine to decrease the risk of infection.   What to expect:  The penis will look red and raw for 5-7 days as it heals. We expect scabbing around where the cut was made, as well as clear-pink fluid and some swelling of the penis right after the procedure. If your baby's circumcision starts to bleed or develops pus, please contact your pediatrician immediately.  All questions were answered and mother consented.  Concepcion Living, MD 9:29 AM

## 2021-10-25 NOTE — Discharge Instructions (Signed)
-   Continue your prenatal vitamins especially if breastfeeding - Try to eat iron rich food. - Take iron supplement as prescribed every other day. Take along with fruit or orange juice, avoid taking within 30 mins of eating dairy (milk, cheese) - Take over the counter tylenol (500mg) or ibuprofen (200mg) three times a day as needed for cramping/pain. - continue blood pressure medication. - Take water pill (lasix) as prescribed for a total of 5 days. - follow up in clinic in 1 week for a blood pressure check and in 4-6 weeks as scheduled for your regular post partum visit. - Please come back to [facility]U if you notice persistently elevated blood pressures or you start to have a headache, that doesn't get better with medications (tylenol and ibuprofen), rest (4hrs of sleep) and drinking water.  

## 2021-10-26 ENCOUNTER — Encounter: Payer: 59 | Admitting: Certified Nurse Midwife

## 2021-10-28 ENCOUNTER — Ambulatory Visit: Payer: 59

## 2021-10-31 ENCOUNTER — Inpatient Hospital Stay (HOSPITAL_COMMUNITY): Admission: RE | Admit: 2021-10-31 | Payer: 59 | Source: Ambulatory Visit

## 2021-10-31 ENCOUNTER — Ambulatory Visit: Payer: 59

## 2021-10-31 ENCOUNTER — Telehealth: Payer: Self-pay

## 2021-10-31 NOTE — Telephone Encounter (Signed)
Called pt to follow up on missed appt for BP check; VM left with call back number to reschedule.

## 2021-11-01 ENCOUNTER — Telehealth (HOSPITAL_COMMUNITY): Payer: Self-pay | Admitting: *Deleted

## 2021-11-01 NOTE — Telephone Encounter (Signed)
Left phone voicemail message.  Odis Hollingshead, RN 11-01-2021 at 1:56pm

## 2021-11-01 NOTE — Telephone Encounter (Signed)
Opened chart in error.  Odis Hollingshead, RN 11-01-2021 at 1:50pm

## 2021-11-02 ENCOUNTER — Encounter: Payer: Self-pay | Admitting: Certified Nurse Midwife

## 2021-11-06 ENCOUNTER — Inpatient Hospital Stay (HOSPITAL_COMMUNITY): Admission: AD | Admit: 2021-11-06 | Payer: 59 | Source: Home / Self Care | Admitting: Obstetrics & Gynecology

## 2021-11-06 ENCOUNTER — Inpatient Hospital Stay (HOSPITAL_COMMUNITY): Payer: 59

## 2021-11-07 ENCOUNTER — Encounter: Payer: Self-pay | Admitting: Pulmonary Disease

## 2021-11-09 ENCOUNTER — Encounter: Payer: Self-pay | Admitting: Certified Nurse Midwife

## 2021-11-16 ENCOUNTER — Encounter: Payer: Self-pay | Admitting: Certified Nurse Midwife

## 2021-11-16 ENCOUNTER — Other Ambulatory Visit: Payer: Self-pay

## 2021-12-01 ENCOUNTER — Ambulatory Visit: Payer: Self-pay | Admitting: Family Medicine

## 2021-12-07 ENCOUNTER — Encounter: Payer: Self-pay | Admitting: Pulmonary Disease

## 2021-12-21 ENCOUNTER — Encounter: Payer: Self-pay | Admitting: Certified Nurse Midwife

## 2021-12-21 ENCOUNTER — Other Ambulatory Visit: Payer: Self-pay

## 2021-12-21 ENCOUNTER — Ambulatory Visit (INDEPENDENT_AMBULATORY_CARE_PROVIDER_SITE_OTHER): Payer: 59 | Admitting: Certified Nurse Midwife

## 2021-12-21 DIAGNOSIS — O1093 Unspecified pre-existing hypertension complicating the puerperium: Secondary | ICD-10-CM

## 2021-12-21 DIAGNOSIS — O10919 Unspecified pre-existing hypertension complicating pregnancy, unspecified trimester: Secondary | ICD-10-CM

## 2021-12-21 DIAGNOSIS — F902 Attention-deficit hyperactivity disorder, combined type: Secondary | ICD-10-CM | POA: Diagnosis not present

## 2021-12-21 DIAGNOSIS — E559 Vitamin D deficiency, unspecified: Secondary | ICD-10-CM

## 2021-12-21 LAB — POCT PREGNANCY, URINE: Preg Test, Ur: NEGATIVE

## 2021-12-22 ENCOUNTER — Encounter: Payer: Self-pay | Admitting: Pulmonary Disease

## 2021-12-22 ENCOUNTER — Encounter: Payer: Self-pay | Admitting: Certified Nurse Midwife

## 2021-12-22 NOTE — Progress Notes (Signed)
Post Partum Visit Note  Carrie [name] is a 31 y.o. [phone] female who presents for a postpartum visit. She is 8 weeks postpartum following a normal spontaneous vaginal delivery.  I have fully reviewed the prenatal and intrapartum course. The delivery was at [redacted]w[redacted]d gestational weeks.  Anesthesia: none. Postpartum course has been uncomplicated physically, but she's feeling overwhelmed (has a diagnosis of ADHD). Baby is doing well. Baby is feeding by breast. Bleeding no bleeding. Bowel function is normal. Bladder function is normal. Patient is not sexually active. Contraception method is  currently undecided, wants to discuss options . Postpartum depression screening: negative.   Health Maintenance Due  Topic Date Due   COVID-19 Vaccine (1) Never done    The following portions of the patient's history were reviewed and updated as appropriate: allergies, current medications, past family history, past medical history, past social history, past surgical history, and problem list.  Review of Systems Pertinent items noted in HPI and remainder of comprehensive ROS otherwise negative.  Objective:  BP 125/69   Pulse 66   Wt 178 lb 12.8 oz (81.1 kg)   LMP 02/04/2021 Comment: would like to discuss contraception options; no nexplanon, has had IUD before; currently sexually active (not using any methods of contraception)  Breastfeeding Yes   BMI 33.78 kg/m    Constitutional: Alert, oriented female in no physical distress.  HEENT: PERRLA Skin: normal color and turgor, no rash Cardiovascular: normal rate & rhythm, warm and well perfused Respiratory: normal effort, no problems with respiration noted GI: Abd soft, non-tender MS: Extremities nontender, no edema, normal ROM Neurologic: Alert and oriented x 4.  GU: no CVA tenderness Pelvic: exam deferred Breasts: normal lactating breasts  Assessment:  1. Postpartum care and examination - Doing overall well, physically recovering  well  2. Chronic hypertension affecting pregnancy - BP normal today without meds, no swelling or new development of chronic headaches  3. Attention deficit hyperactivity disorder (ADHD), combined type - Pt in need of additional time at home to adjust to rigors of new baby and her own executive functioning. - Work note filled out to extend her postpartum leave for a full 12 weeks.  Plan:   Essential components of care per ACOG recommendations:  1.  Mood and well being: Patient with negative depression screening today. Reviewed local resources for support.  - Patient tobacco use? No.   - hx of drug use? No.    2. Infant care and feeding:  -Patient currently breastmilk feeding? Yes. Reviewed importance of draining breast regularly to support lactation.  -Social determinants of health (SDOH) reviewed in EPIC - visited food market after visit  3. Sexuality, contraception and birth spacing - Patient does not want a pregnancy in the next year.  Desired family size is 3 children.  - Reviewed reproductive life planning. Reviewed contraceptive methods based on pt preferences and effectiveness.  Patient desired No Method - Other Reason today. Reviewed options and wants to call for another appt once she has decided.   - Discussed birth spacing of 18 months  4. Sleep and fatigue -Encouraged family/partner/community support of 4 hrs of uninterrupted sleep to help with mood and fatigue  5. Physical Recovery  - Discussed patients delivery and complications. She describes her labor as good. - Patient had a Vaginal, no problems at delivery. Patient had  no  laceration. Perineal healing reviewed. Patient expressed understanding - Patient has urinary incontinence? No. - Patient is safe to resume physical and sexual activity  6.  Health Maintenance - HM due items addressed Yes - Last pap smear  Diagnosis  Date Value Ref Range Status  04/15/2021 (A)  Final   - Atypical squamous cells of  undetermined significance (ASC-US)   Pap smear not done at today's visit. Per ASSCP guidelines, pt can follow standard timing and will need next pap in 2026. -Breast Cancer screening indicated? No.   7. Chronic Disease/Pregnancy Condition follow up: None - PCP follow up as needed  Bernerd Limbo, CNM [facility] [employer], [facility]

## 2022-01-09 ENCOUNTER — Encounter: Payer: Self-pay | Admitting: Certified Nurse Midwife

## 2022-06-13 ENCOUNTER — Telehealth: Payer: 59 | Admitting: Family Medicine

## 2022-06-13 DIAGNOSIS — R3989 Other symptoms and signs involving the genitourinary system: Secondary | ICD-10-CM | POA: Diagnosis not present

## 2022-06-13 MED ORDER — CEPHALEXIN 500 MG PO CAPS: 500.0000 mg | ORAL_CAPSULE | Freq: Two times a day (BID) | ORAL | 0 refills | Status: AC

## 2022-06-13 NOTE — Progress Notes (Signed)

## 2022-08-18 ENCOUNTER — Telehealth: Payer: 59 | Admitting: Family Medicine

## 2022-08-18 DIAGNOSIS — B9689 Other specified bacterial agents as the cause of diseases classified elsewhere: Secondary | ICD-10-CM | POA: Diagnosis not present

## 2022-08-18 DIAGNOSIS — N760 Acute vaginitis: Secondary | ICD-10-CM

## 2022-08-18 MED ORDER — METRONIDAZOLE 500 MG PO TABS: 500.0000 mg | ORAL_TABLET | Freq: Three times a day (TID) | ORAL | 0 refills | Status: AC

## 2022-08-18 NOTE — Patient Instructions (Signed)

## 2022-08-18 NOTE — Progress Notes (Signed)
Virtual Visit Consent   Carrie [name], you are scheduled for a virtual visit with a [facility] provider today. Just as with appointments in the office, your consent must be obtained to participate. Your consent will be active for this visit and any virtual visit you may have with one of our providers in the next 365 days. If you have a MyChart account, a copy of this consent can be sent to you electronically.  As this is a virtual visit, video technology does not allow for your provider to perform a traditional examination. This may limit your provider's ability to fully assess your condition. If your provider identifies any concerns that need to be evaluated in person or the need to arrange testing (such as labs, EKG, etc.), we will make arrangements to do so. Although advances in technology are sophisticated, we cannot ensure that it will always work on either your end or our end. If the connection with a video visit is poor, the visit may have to be switched to a telephone visit. With either a video or telephone visit, we are not always able to ensure that we have a secure connection.  By engaging in this virtual visit, you consent to the provision of healthcare and authorize for your insurance to be billed (if applicable) for the services provided during this visit. Depending on your insurance coverage, you may receive a charge related to this service.  I need to obtain your verbal consent now. Are you [name] to proceed with your visit today? Carrie [name] has provided verbal consent on 08/18/2022 for a virtual visit (video or telephone). Georgana Curio, FNP  Date: 08/18/2022 11:00 AM  Virtual Visit via Video Note   I, Georgana Curio, connected with  Carrie [name]  ([id number], Feb 14, 1990) on 08/18/22 at 11:00 AM EDT by a video-enabled telemedicine application and verified that I am speaking with the correct person using two identifiers.  Location: Patient: Virtual Visit  Location Patient: Home Provider: Virtual Visit Location Provider: Home Office   I discussed the limitations of evaluation and management by telemedicine and the availability of in person appointments. The patient expressed understanding and agreed to proceed.    History of Present Illness: Carrie [name] is a 32 y.o. who identifies as a female who was assigned female at birth, and is being seen today for a fishy clear vaginal discharge for a month. Boric acid not improving. Denies abd pain and fever. Not breast feeding or pregnant. [name]  HPI: HPI  Problems:  Patient Active Problem List   Diagnosis Date Noted   Anxiety 05/29/2019   ADHD 03/21/2016   Vitamin D deficiency disease    Bipolar 2 disorder (HCC)    Asthma 07/30/2012    Allergies: No Known Allergies Medications:  Current Outpatient Medications:    metroNIDAZOLE (FLAGYL) 500 MG tablet, Take 1 tablet (500 mg total) by mouth 3 (three) times daily for 7 days., Disp: 21 tablet, Rfl: 0   albuterol (VENTOLIN HFA) 108 (90 Base) MCG/ACT inhaler, Inhale 2 puffs into the lungs every 6 (six) hours as needed for wheezing or shortness of breath., Disp: 8 g, Rfl: 2   ferrous sulfate (FERROUSUL) 325 (65 FE) MG tablet, Take 1 tablet (325 mg total) by mouth 2 (two) times daily with a meal. (Patient not taking: Reported on 12/21/2021), Disp: 60 tablet, Rfl: 1   fluticasone-salmeterol (ADVAIR HFA) 45-21 MCG/ACT inhaler, Inhale 2 puffs into the lungs 2 (two) times daily., Disp: 1 each, Rfl:  12   furosemide (LASIX) 20 MG tablet, Take 1 tablet (20 mg total) by mouth daily for 4 days., Disp: 4 tablet, Rfl: 0   Prenatal MV & Min w/FA-DHA (PRENATAL ADULT GUMMY/DHA/FA) 0.4-25 MG CHEW, Chew 1 Dose by mouth daily. Daily dose indicated on package. (Patient not taking: Reported on 10/14/2021), Disp: 30 tablet, Rfl: 11  Observations/Objective: Patient is well-developed, well-nourished in no acute distress.  Resting comfortably in parked car Head is  normocephalic, atraumatic.  No labored breathing.  Speech is clear and coherent with logical content.  Patient is alert and oriented at baseline.    Assessment and Plan: 1. Bacterial vaginitis  Med use and side effects per pharmacy, uc if sx persist or worsen  Follow Up Instructions: I discussed the assessment and treatment plan with the patient. The patient was provided an opportunity to ask questions and all were answered. The patient agreed with the plan and demonstrated an understanding of the instructions.  A copy of instructions were sent to the patient via MyChart unless otherwise noted below.     The patient was advised to call back or seek an in-person evaluation if the symptoms worsen or if the condition fails to improve as anticipated.  Time:  I spent 8 minutes with the patient via telehealth technology discussing the above problems/concerns.    Georgana Curio, FNP

## 2023-01-01 ENCOUNTER — Telehealth: Payer: 59 | Admitting: Family Medicine

## 2023-01-01 DIAGNOSIS — N3000 Acute cystitis without hematuria: Secondary | ICD-10-CM | POA: Diagnosis not present

## 2023-01-01 DIAGNOSIS — B379 Candidiasis, unspecified: Secondary | ICD-10-CM | POA: Diagnosis not present

## 2023-01-01 DIAGNOSIS — T3695XA Adverse effect of unspecified systemic antibiotic, initial encounter: Secondary | ICD-10-CM

## 2023-01-01 MED ORDER — FLUCONAZOLE 150 MG PO TABS: 150.0000 mg | ORAL_TABLET | Freq: Every day | ORAL | 0 refills | Status: AC

## 2023-01-01 MED ORDER — NITROFURANTOIN MONOHYD MACRO 100 MG PO CAPS: 100.0000 mg | ORAL_CAPSULE | Freq: Two times a day (BID) | ORAL | 0 refills | Status: AC

## 2023-01-01 NOTE — Progress Notes (Signed)
Virtual Visit Consent   Carrie [name], you are scheduled for a virtual visit with a [facility] provider today. Just as with appointments in the office, your consent must be obtained to participate. Your consent will be active for this visit and any virtual visit you may have with one of our providers in the next 365 days. If you have a MyChart account, a copy of this consent can be sent to you electronically.  As this is a virtual visit, video technology does not allow for your provider to perform a traditional examination. This may limit your provider's ability to fully assess your condition. If your provider identifies any concerns that need to be evaluated in person or the need to arrange testing (such as labs, EKG, etc.), we will make arrangements to do so. Although advances in technology are sophisticated, we cannot ensure that it will always work on either your end or our end. If the connection with a video visit is poor, the visit may have to be switched to a telephone visit. With either a video or telephone visit, we are not always able to ensure that we have a secure connection.  By engaging in this virtual visit, you consent to the provision of healthcare and authorize for your insurance to be billed (if applicable) for the services provided during this visit. Depending on your insurance coverage, you may receive a charge related to this service.  I need to obtain your verbal consent now. Are you willing to proceed with your visit today? Carrie [name] has provided verbal consent on 01/01/2023 for a virtual visit (video or telephone). Freddy Finner, NP  Date: 01/01/2023 11:15 AM  Virtual Visit via Video Note   I, Freddy Finner, connected with  Carrie [name]  ([id number], 11/15/1990) on 01/01/23 at 11:15 AM EST by a video-enabled telemedicine application and verified that I am speaking with the correct person using two identifiers.  Location: Patient: Virtual Visit  Location Patient: Other: work Provider: Pharmacist, community: Home Office   I discussed the limitations of evaluation and management by telemedicine and the availability of in person appointments. The patient expressed understanding and agreed to proceed.    History of Present Illness: Carrie [name] is a 32 y.o. who identifies as a female who was assigned female at birth, and is being seen today for UTI symptoms    HPI: Urinary Tract Infection  This is a new problem. The current episode started 1 to 4 weeks ago. The problem occurs every urination. The problem has been gradually worsening. The quality of the pain is described as aching. There has been no fever. She is Sexually active. There is No history of pyelonephritis. Associated symptoms include frequency and urgency. Pertinent negatives include no hematuria, hesitancy, nausea, possible pregnancy, sweats or vomiting. The treatment provided no relief.    Problems:  Patient Active Problem List   Diagnosis Date Noted   Anxiety 05/29/2019   ADHD 03/21/2016   Vitamin D deficiency disease    Bipolar 2 disorder (HCC)    Asthma 07/30/2012    Allergies: No Known Allergies Medications:  Current Outpatient Medications:    albuterol (VENTOLIN HFA) 108 (90 Base) MCG/ACT inhaler, Inhale 2 puffs into the lungs every 6 (six) hours as needed for wheezing or shortness of breath., Disp: 8 g, Rfl: 2   ferrous sulfate (FERROUSUL) 325 (65 FE) MG tablet, Take 1 tablet (325 mg total) by mouth 2 (two) times daily with a  meal. (Patient not taking: Reported on 12/21/2021), Disp: 60 tablet, Rfl: 1   fluticasone-salmeterol (ADVAIR HFA) 45-21 MCG/ACT inhaler, Inhale 2 puffs into the lungs 2 (two) times daily., Disp: 1 each, Rfl: 12   furosemide (LASIX) 20 MG tablet, Take 1 tablet (20 mg total) by mouth daily for 4 days., Disp: 4 tablet, Rfl: 0   Prenatal MV & Min w/FA-DHA (PRENATAL ADULT GUMMY/DHA/FA) 0.4-25 MG CHEW, Chew 1 Dose by mouth daily.  Daily dose indicated on package. (Patient not taking: Reported on 10/14/2021), Disp: 30 tablet, Rfl: 11  Observations/Objective: Patient is well-developed, well-nourished in no acute distress.  Resting comfortably  at home.  Head is normocephalic, atraumatic.  No labored breathing.  Speech is clear and coherent with logical content.  Patient is alert and oriented at baseline.    Assessment and Plan:   1. Acute cystitis without hematuria  - nitrofurantoin, macrocrystal-monohydrate, (MACROBID) 100 MG capsule; Take 1 capsule (100 mg total) by mouth 2 (two) times daily for 5 days.  Dispense: 10 capsule; Refill: 0  2. Antibiotic-induced yeast infection  - fluconazole (DIFLUCAN) 150 MG tablet; Take 1 tablet (150 mg total) by mouth daily.  Dispense: 1 tablet; Refill: 0   -no other red flags for stone or kidney infection -increase fluids -complete medication as discussed -prevention discussed and on AVS    Reviewed side effects, risks and benefits of medication.    Patient acknowledged agreement and understanding of the plan.   Past Medical, Surgical, Social History, Allergies, and Medications have been Reviewed.    Follow Up Instructions: I discussed the assessment and treatment plan with the patient. The patient was provided an opportunity to ask questions and all were answered. The patient agreed with the plan and demonstrated an understanding of the instructions.  A copy of instructions were sent to the patient via MyChart unless otherwise noted below.    The patient was advised to call back or seek an in-person evaluation if the symptoms worsen or if the condition fails to improve as anticipated.    Freddy Finner, NP

## 2023-01-01 NOTE — Patient Instructions (Signed)
Hughes Better, thank you for joining Freddy Finner, NP for today's virtual visit.  While this provider is not your primary care provider (PCP), if your PCP is located in our provider database this encounter information will be shared with them immediately following your visit.   A [facility] MyChart account gives you access to today's visit and all your visits, tests, and labs performed at [facility] " click here if you don't have a [facility] MyChart account or go to [email or web address]  Consent: (Patient) Carrie [name] provided verbal consent for this virtual visit at the beginning of the encounter.  Current Medications:  Current Outpatient Medications:    fluconazole (DIFLUCAN) 150 MG tablet, Take 1 tablet (150 mg total) by mouth daily., Disp: 1 tablet, Rfl: 0   nitrofurantoin, macrocrystal-monohydrate, (MACROBID) 100 MG capsule, Take 1 capsule (100 mg total) by mouth 2 (two) times daily for 5 days., Disp: 10 capsule, Rfl: 0   albuterol (VENTOLIN HFA) 108 (90 Base) MCG/ACT inhaler, Inhale 2 puffs into the lungs every 6 (six) hours as needed for wheezing or shortness of breath., Disp: 8 g, Rfl: 2   ferrous sulfate (FERROUSUL) 325 (65 FE) MG tablet, Take 1 tablet (325 mg total) by mouth 2 (two) times daily with a meal. (Patient not taking: Reported on 12/21/2021), Disp: 60 tablet, Rfl: 1   fluticasone-salmeterol (ADVAIR HFA) 45-21 MCG/ACT inhaler, Inhale 2 puffs into the lungs 2 (two) times daily., Disp: 1 each, Rfl: 12   furosemide (LASIX) 20 MG tablet, Take 1 tablet (20 mg total) by mouth daily for 4 days., Disp: 4 tablet, Rfl: 0   Prenatal MV & Min w/FA-DHA (PRENATAL ADULT GUMMY/DHA/FA) 0.4-25 MG CHEW, Chew 1 Dose by mouth daily. Daily dose indicated on package. (Patient not taking: Reported on 10/14/2021), Disp: 30 tablet, Rfl: 11   Medications ordered in this encounter:  Meds ordered this encounter  Medications   nitrofurantoin,  macrocrystal-monohydrate, (MACROBID) 100 MG capsule    Sig: Take 1 capsule (100 mg total) by mouth 2 (two) times daily for 5 days.    Dispense:  10 capsule    Refill:  0    Order Specific Question:   Supervising Provider    Answer:   Merrilee Jansky [[id number]]   fluconazole (DIFLUCAN) 150 MG tablet    Sig: Take 1 tablet (150 mg total) by mouth daily.    Dispense:  1 tablet    Refill:  0    Order Specific Question:   Supervising Provider    Answer:   Merrilee Jansky [id number]     *If you need refills on other medications prior to your next appointment, please contact your pharmacy*  Follow-Up: Call back or seek an in-person evaluation if the symptoms worsen or if the condition fails to improve as anticipated.  [facility] [phone]  Other Instructions  -no other red flags for stone or kidney infection -increase fluids -complete medication as discussed -take Diflucan after you complete the Macrobid   Happy Thanksgiving!   If you have been instructed to have an in-person evaluation today at a local Urgent Care facility, please use the link below. It will take you to a list of all of our available [facility], including address, phone number and hours of operation. Please do not delay care.  [facility]  If you or a family member do not have a primary care provider, use the link below to schedule a  visit and establish care. When you choose a [facility] primary care physician or advanced practice provider, you gain a long-term partner in health. Find a Primary Care Provider  Learn more about [facility] in-office and virtual care options: [facility] - Get Care Now

## 2023-10-28 IMAGING — US US OB TRANSVAGINAL
1 series · 7 of 7 positions shown · non-contrast
Comparison: 04/14/2021

CLINICAL DATA: Vaginal bleeding, pregnancy

EXAM:
TRANSVAGINAL OB ULTRASOUND
TECHNIQUE: Transvaginal ultrasound was performed for complete evaluation of the
gestation as well as the maternal uterus, adnexal regions, and
pelvic cul-de-sac.

[Series 1: us ob transvaginal · 7 of 7 slices shown]
[im 1/7]
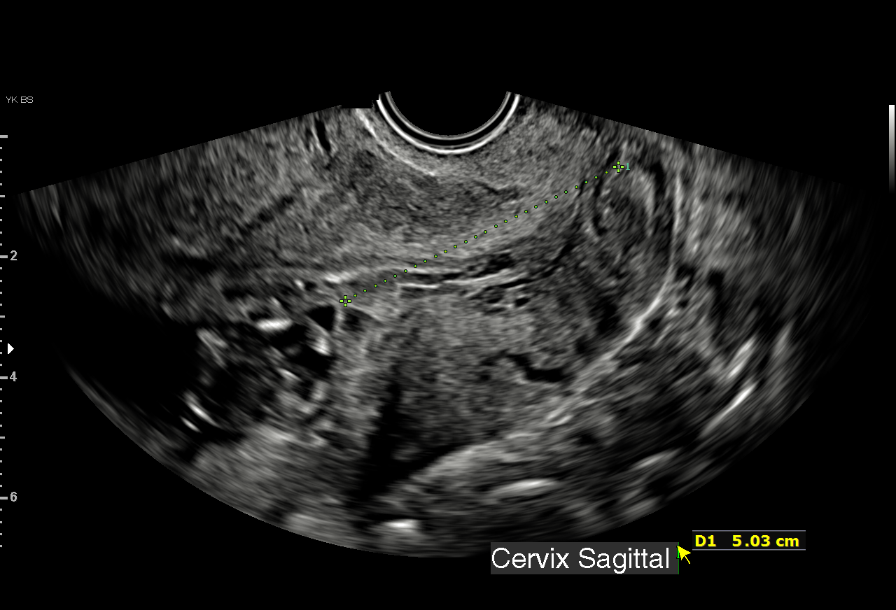
[im 2/7]
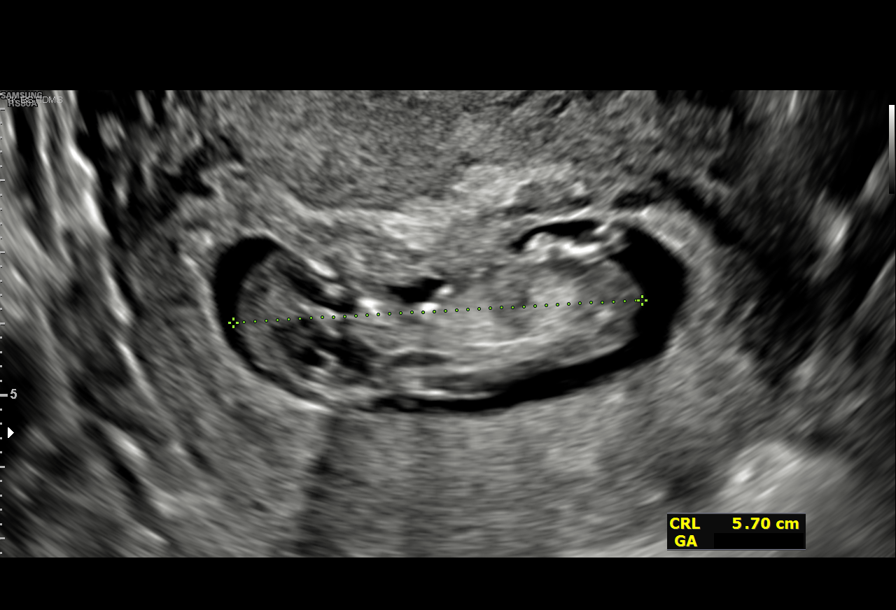
[im 3/7]
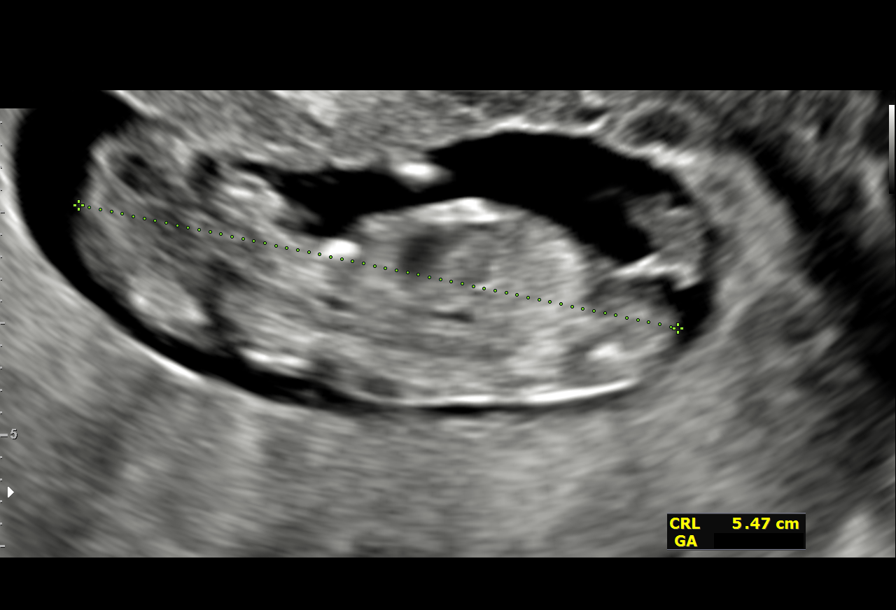
[im 4/7]
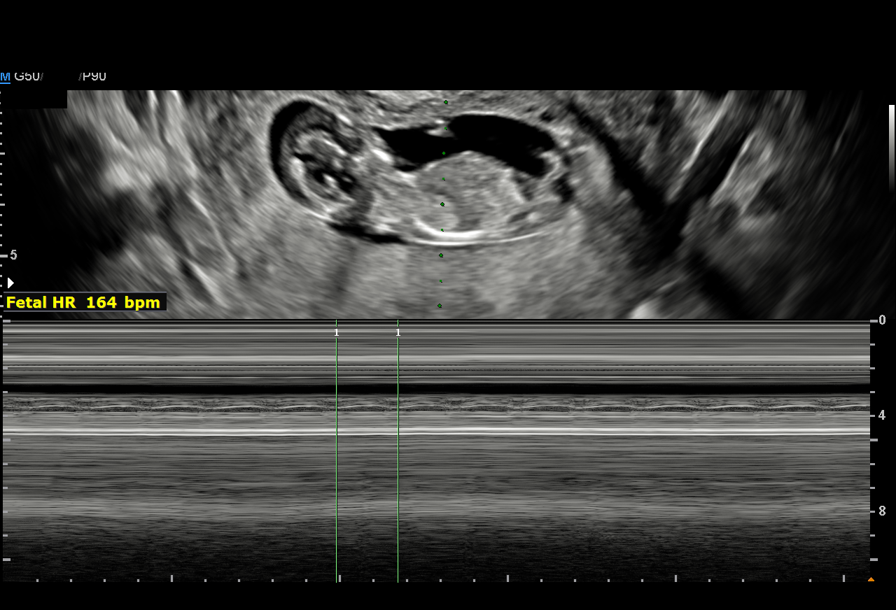
[im 5/7]
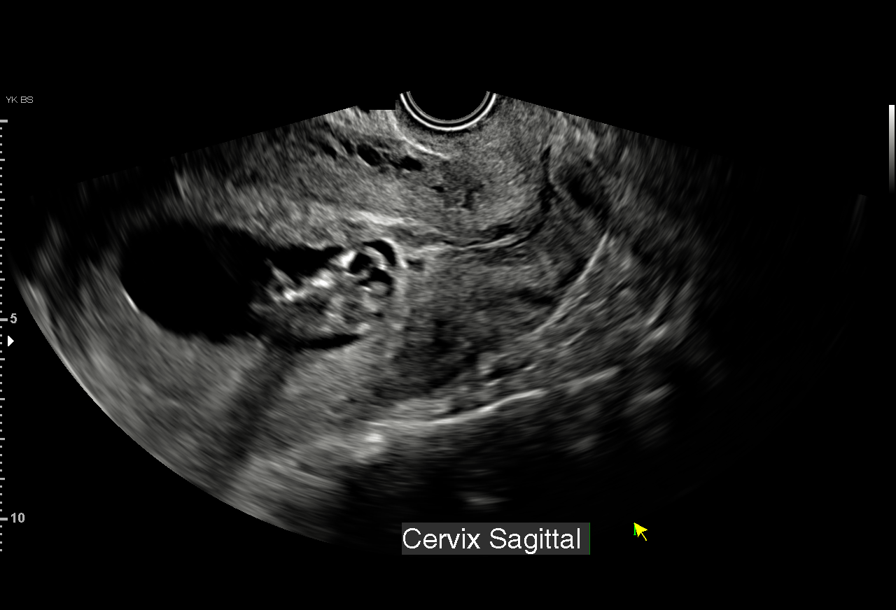
[im 6/7]
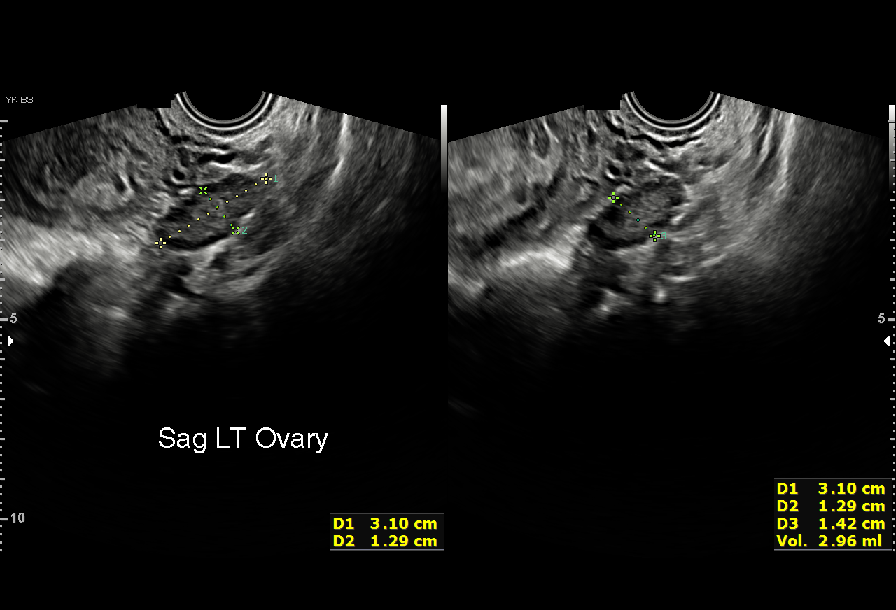
[im 7/7]
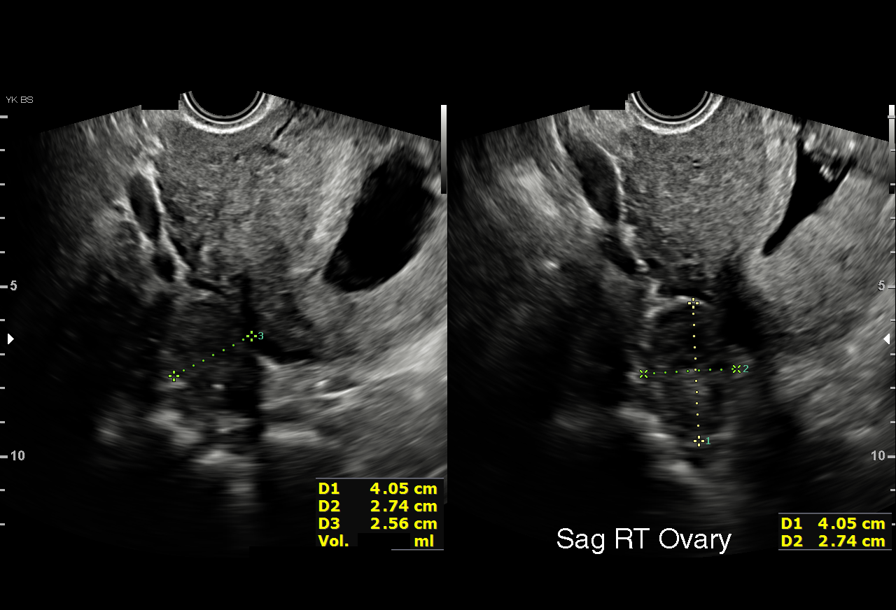

[7 of 7 positions shown; findings below may reference images not displayed]

FINDINGS: Intrauterine gestational sac: Single

Yolk sac:  Visualized.

Embryo:  Visualized.

Cardiac Activity: Visualized.

Heart Rate: 164 bpm

CRL:   55.9 mm   12 w 1 d                  US EDC: 11/12/2021

Subchorionic hemorrhage:  None visualized.

Maternal uterus/adnexae: Bilateral ovaries within normal limits.
Cervix is closed measuring approximately 5.0 cm in length.
IMPRESSION: 1. Single live intrauterine gestation measuring 12 weeks 1 day by
crown-rump length.
2. Active fetal heart tones at 164 BPM.
